# Patient Record
Sex: Male | Born: 1960
Health system: Southern US, Community
[De-identification: ages and names within clinical notes are randomized; demographics above are authoritative.]

## PROBLEM LIST (undated history)

## (undated) DIAGNOSIS — M109 Gout, unspecified: Secondary | ICD-10-CM

## (undated) DIAGNOSIS — N184 Chronic kidney disease, stage 4 (severe): Secondary | ICD-10-CM

## (undated) DIAGNOSIS — I671 Cerebral aneurysm, nonruptured: Secondary | ICD-10-CM

## (undated) DIAGNOSIS — M1990 Unspecified osteoarthritis, unspecified site: Secondary | ICD-10-CM

## (undated) DIAGNOSIS — Z972 Presence of dental prosthetic device (complete) (partial): Secondary | ICD-10-CM

## (undated) DIAGNOSIS — I10 Essential (primary) hypertension: Secondary | ICD-10-CM

## (undated) DIAGNOSIS — N183 Chronic kidney disease, stage 3 (moderate): Secondary | ICD-10-CM

## (undated) DIAGNOSIS — R569 Unspecified convulsions: Secondary | ICD-10-CM

## (undated) DIAGNOSIS — E7800 Pure hypercholesterolemia, unspecified: Secondary | ICD-10-CM

## (undated) HISTORY — PX: MULTIPLE TOOTH EXTRACTIONS: SHX2053

## (undated) HISTORY — PX: COLONOSCOPY: SHX174

## (undated) HISTORY — PX: VARICOSE VEIN SURGERY: SHX832

---

## 2001-10-21 HISTORY — PX: CEREBRAL ANEURYSM REPAIR: SHX164

## 2017-08-07 ENCOUNTER — Emergency Department (HOSPITAL_BASED_OUTPATIENT_CLINIC_OR_DEPARTMENT_OTHER): Payer: Medicaid Other

## 2017-08-07 ENCOUNTER — Inpatient Hospital Stay (HOSPITAL_BASED_OUTPATIENT_CLINIC_OR_DEPARTMENT_OTHER)
Admission: EM | Admit: 2017-08-07 | Discharge: 2017-08-11 | DRG: 513 | Disposition: A | Discharge status: Home / Self Care | Payer: Medicaid Other | Attending: Nephrology | Admitting: Nephrology

## 2017-08-07 ENCOUNTER — Encounter (HOSPITAL_BASED_OUTPATIENT_CLINIC_OR_DEPARTMENT_OTHER): Payer: Self-pay | Admitting: Emergency Medicine

## 2017-08-07 DIAGNOSIS — G40909 Epilepsy, unspecified, not intractable, without status epilepticus: Secondary | ICD-10-CM | POA: Diagnosis present

## 2017-08-07 DIAGNOSIS — F17200 Nicotine dependence, unspecified, uncomplicated: Secondary | ICD-10-CM | POA: Diagnosis present

## 2017-08-07 DIAGNOSIS — Z888 Allergy status to other drugs, medicaments and biological substances status: Secondary | ICD-10-CM

## 2017-08-07 DIAGNOSIS — Z8679 Personal history of other diseases of the circulatory system: Secondary | ICD-10-CM

## 2017-08-07 DIAGNOSIS — E785 Hyperlipidemia, unspecified: Secondary | ICD-10-CM | POA: Diagnosis present

## 2017-08-07 DIAGNOSIS — B9562 Methicillin resistant Staphylococcus aureus infection as the cause of diseases classified elsewhere: Secondary | ICD-10-CM | POA: Diagnosis present

## 2017-08-07 DIAGNOSIS — N281 Cyst of kidney, acquired: Secondary | ICD-10-CM

## 2017-08-07 DIAGNOSIS — N179 Acute kidney failure, unspecified: Secondary | ICD-10-CM

## 2017-08-07 DIAGNOSIS — T8130XA Disruption of wound, unspecified, initial encounter: Secondary | ICD-10-CM | POA: Diagnosis present

## 2017-08-07 DIAGNOSIS — I129 Hypertensive chronic kidney disease with stage 1 through stage 4 chronic kidney disease, or unspecified chronic kidney disease: Secondary | ICD-10-CM | POA: Diagnosis present

## 2017-08-07 DIAGNOSIS — M109 Gout, unspecified: Secondary | ICD-10-CM | POA: Diagnosis present

## 2017-08-07 DIAGNOSIS — Q613 Polycystic kidney, unspecified: Secondary | ICD-10-CM

## 2017-08-07 DIAGNOSIS — I10 Essential (primary) hypertension: Secondary | ICD-10-CM | POA: Diagnosis present

## 2017-08-07 DIAGNOSIS — E7800 Pure hypercholesterolemia, unspecified: Secondary | ICD-10-CM | POA: Diagnosis present

## 2017-08-07 DIAGNOSIS — M86141 Other acute osteomyelitis, right hand: Secondary | ICD-10-CM

## 2017-08-07 DIAGNOSIS — L02511 Cutaneous abscess of right hand: Secondary | ICD-10-CM | POA: Diagnosis present

## 2017-08-07 DIAGNOSIS — N183 Chronic kidney disease, stage 3 unspecified: Secondary | ICD-10-CM | POA: Diagnosis present

## 2017-08-07 DIAGNOSIS — R234 Changes in skin texture: Secondary | ICD-10-CM

## 2017-08-07 DIAGNOSIS — N170 Acute kidney failure with tubular necrosis: Secondary | ICD-10-CM | POA: Diagnosis present

## 2017-08-07 DIAGNOSIS — M869 Osteomyelitis, unspecified: Principal | ICD-10-CM | POA: Diagnosis present

## 2017-08-07 DIAGNOSIS — Z23 Encounter for immunization: Secondary | ICD-10-CM

## 2017-08-07 HISTORY — DX: Essential (primary) hypertension: I10

## 2017-08-07 HISTORY — DX: Chronic kidney disease, stage 3 (moderate): N18.3

## 2017-08-07 HISTORY — DX: Unspecified osteoarthritis, unspecified site: M19.90

## 2017-08-07 HISTORY — DX: Cerebral aneurysm, nonruptured: I67.1

## 2017-08-07 HISTORY — DX: Pure hypercholesterolemia, unspecified: E78.00

## 2017-08-07 HISTORY — DX: Unspecified convulsions: R56.9

## 2017-08-07 HISTORY — DX: Gout, unspecified: M10.9

## 2017-08-07 NOTE — ED Triage Notes (Signed)
Patient reports that he has been traveling for the last 3 weeks ago, he had his right thumb evaluated then - he currently has a swelling to his right thumb with puss draining out of it.

## 2017-08-08 ENCOUNTER — Encounter (HOSPITAL_COMMUNITY)
Admission: EM | Disposition: A | Discharge status: Home / Self Care | Payer: Self-pay | Source: Home / Self Care | Attending: Internal Medicine

## 2017-08-08 ENCOUNTER — Inpatient Hospital Stay (HOSPITAL_COMMUNITY): Payer: Medicaid Other | Admitting: Certified Registered"

## 2017-08-08 ENCOUNTER — Inpatient Hospital Stay: Admit: 2017-08-08 | Payer: Self-pay | Admitting: Orthopedic Surgery

## 2017-08-08 ENCOUNTER — Ambulatory Visit: Admit: 2017-08-08 | Payer: Self-pay | Admitting: Orthopedic Surgery

## 2017-08-08 ENCOUNTER — Encounter (HOSPITAL_BASED_OUTPATIENT_CLINIC_OR_DEPARTMENT_OTHER): Payer: Self-pay | Admitting: Emergency Medicine

## 2017-08-08 DIAGNOSIS — R234 Changes in skin texture: Secondary | ICD-10-CM

## 2017-08-08 DIAGNOSIS — N179 Acute kidney failure, unspecified: Secondary | ICD-10-CM | POA: Diagnosis not present

## 2017-08-08 DIAGNOSIS — G40909 Epilepsy, unspecified, not intractable, without status epilepticus: Secondary | ICD-10-CM | POA: Diagnosis present

## 2017-08-08 DIAGNOSIS — T8130XA Disruption of wound, unspecified, initial encounter: Secondary | ICD-10-CM | POA: Diagnosis present

## 2017-08-08 DIAGNOSIS — Z8679 Personal history of other diseases of the circulatory system: Secondary | ICD-10-CM | POA: Diagnosis not present

## 2017-08-08 DIAGNOSIS — N281 Cyst of kidney, acquired: Secondary | ICD-10-CM | POA: Diagnosis not present

## 2017-08-08 DIAGNOSIS — N183 Chronic kidney disease, stage 3 (moderate): Secondary | ICD-10-CM | POA: Diagnosis present

## 2017-08-08 DIAGNOSIS — M869 Osteomyelitis, unspecified: Secondary | ICD-10-CM | POA: Diagnosis present

## 2017-08-08 DIAGNOSIS — Z23 Encounter for immunization: Secondary | ICD-10-CM | POA: Diagnosis not present

## 2017-08-08 DIAGNOSIS — F17200 Nicotine dependence, unspecified, uncomplicated: Secondary | ICD-10-CM | POA: Diagnosis present

## 2017-08-08 DIAGNOSIS — L02511 Cutaneous abscess of right hand: Secondary | ICD-10-CM | POA: Diagnosis present

## 2017-08-08 DIAGNOSIS — M79641 Pain in right hand: Secondary | ICD-10-CM | POA: Diagnosis present

## 2017-08-08 DIAGNOSIS — M86042 Acute hematogenous osteomyelitis, left hand: Secondary | ICD-10-CM | POA: Diagnosis not present

## 2017-08-08 DIAGNOSIS — Q613 Polycystic kidney, unspecified: Secondary | ICD-10-CM | POA: Diagnosis not present

## 2017-08-08 DIAGNOSIS — B9562 Methicillin resistant Staphylococcus aureus infection as the cause of diseases classified elsewhere: Secondary | ICD-10-CM | POA: Diagnosis present

## 2017-08-08 DIAGNOSIS — E7800 Pure hypercholesterolemia, unspecified: Secondary | ICD-10-CM | POA: Diagnosis present

## 2017-08-08 DIAGNOSIS — N170 Acute kidney failure with tubular necrosis: Secondary | ICD-10-CM | POA: Diagnosis present

## 2017-08-08 DIAGNOSIS — I10 Essential (primary) hypertension: Secondary | ICD-10-CM | POA: Diagnosis not present

## 2017-08-08 DIAGNOSIS — M86141 Other acute osteomyelitis, right hand: Secondary | ICD-10-CM | POA: Diagnosis not present

## 2017-08-08 DIAGNOSIS — Z888 Allergy status to other drugs, medicaments and biological substances status: Secondary | ICD-10-CM | POA: Diagnosis not present

## 2017-08-08 DIAGNOSIS — M109 Gout, unspecified: Secondary | ICD-10-CM | POA: Diagnosis present

## 2017-08-08 DIAGNOSIS — I129 Hypertensive chronic kidney disease with stage 1 through stage 4 chronic kidney disease, or unspecified chronic kidney disease: Secondary | ICD-10-CM | POA: Diagnosis present

## 2017-08-08 DIAGNOSIS — E785 Hyperlipidemia, unspecified: Secondary | ICD-10-CM | POA: Diagnosis present

## 2017-08-08 HISTORY — PX: AMPUTATION FINGER: SHX6594

## 2017-08-08 HISTORY — PX: AMPUTATION: SHX166

## 2017-08-08 LAB — CBC WITH DIFFERENTIAL/PLATELET
Basophils Absolute: 0.1 10*3/uL (ref 0.0–0.1)
Basophils Relative: 1 %
EOS PCT: 3 %
Eosinophils Absolute: 0.3 10*3/uL (ref 0.0–0.7)
HEMATOCRIT: 36.7 % — AB (ref 39.0–52.0)
Hemoglobin: 11.9 g/dL — ABNORMAL LOW (ref 13.0–17.0)
LYMPHS PCT: 13 %
Lymphs Abs: 1.4 10*3/uL (ref 0.7–4.0)
MCH: 29.9 pg (ref 26.0–34.0)
MCHC: 32.4 g/dL (ref 30.0–36.0)
MCV: 92.2 fL (ref 78.0–100.0)
MONO ABS: 1.1 10*3/uL — AB (ref 0.1–1.0)
MONOS PCT: 10 %
Neutro Abs: 8.5 10*3/uL — ABNORMAL HIGH (ref 1.7–7.7)
Neutrophils Relative %: 75 %
PLATELETS: 243 10*3/uL (ref 150–400)
RBC: 3.98 MIL/uL — ABNORMAL LOW (ref 4.22–5.81)
RDW: 14.1 % (ref 11.5–15.5)
WBC: 11.4 10*3/uL — AB (ref 4.0–10.5)

## 2017-08-08 LAB — BASIC METABOLIC PANEL
Anion gap: 5 (ref 5–15)
BUN: 39 mg/dL — AB (ref 6–20)
CALCIUM: 8.8 mg/dL — AB (ref 8.9–10.3)
CO2: 22 mmol/L (ref 22–32)
CREATININE: 1.94 mg/dL — AB (ref 0.61–1.24)
Chloride: 112 mmol/L — ABNORMAL HIGH (ref 101–111)
GFR calc Af Amer: 44 mL/min — ABNORMAL LOW (ref >60)
GFR calc non Af Amer: 38 mL/min — ABNORMAL LOW (ref >60)
GLUCOSE: 102 mg/dL — AB (ref 65–99)
Potassium: 4 mmol/L (ref 3.5–5.1)
Sodium: 139 mmol/L (ref 135–145)

## 2017-08-08 SURGERY — AMPUTATION DIGIT
Anesthesia: General | Site: Thumb | Laterality: Right

## 2017-08-08 SURGERY — AMPUTATION DIGIT
Anesthesia: General | Laterality: Right

## 2017-08-08 MED ORDER — EPHEDRINE 5 MG/ML INJ
INTRAVENOUS | Status: AC
  Filled 2017-08-08: qty 10

## 2017-08-08 MED ORDER — HYDROMORPHONE HCL 1 MG/ML IJ SOLN: 0.5000 mg | INTRAMUSCULAR | Status: DC | PRN

## 2017-08-08 MED ORDER — PHENYLEPHRINE HCL 10 MG/ML IJ SOLN
INTRAMUSCULAR | Status: DC | PRN
  Administered 2017-08-08: 80 ug via INTRAVENOUS

## 2017-08-08 MED ORDER — 0.9 % SODIUM CHLORIDE (POUR BTL) OPTIME
TOPICAL | Status: DC | PRN
  Administered 2017-08-08: 1000 mL

## 2017-08-08 MED ORDER — TETANUS-DIPHTH-ACELL PERTUSSIS 5-2.5-18.5 LF-MCG/0.5 IM SUSP
0.5000 mL | Freq: Once | INTRAMUSCULAR | Status: AC
  Administered 2017-08-08: 0.5 mL via INTRAMUSCULAR
  Filled 2017-08-08: qty 0.5

## 2017-08-08 MED ORDER — ATORVASTATIN CALCIUM 20 MG PO TABS
20.0000 mg | ORAL_TABLET | Freq: Every day | ORAL | Status: DC
  Administered 2017-08-08 – 2017-08-11 (×4): 20 mg via ORAL
  Filled 2017-08-08 (×4): qty 1

## 2017-08-08 MED ORDER — HYDROMORPHONE HCL 1 MG/ML IJ SOLN
INTRAMUSCULAR | Status: AC
  Filled 2017-08-08: qty 1

## 2017-08-08 MED ORDER — ONDANSETRON HCL 4 MG/2ML IJ SOLN
INTRAMUSCULAR | Status: AC
  Filled 2017-08-08: qty 2

## 2017-08-08 MED ORDER — LOSARTAN POTASSIUM-HCTZ 50-12.5 MG PO TABS: 1.0000 | ORAL_TABLET | Freq: Every day | ORAL | Status: DC

## 2017-08-08 MED ORDER — EPHEDRINE SULFATE-NACL 50-0.9 MG/10ML-% IV SOSY
PREFILLED_SYRINGE | INTRAVENOUS | Status: DC | PRN
  Administered 2017-08-08: 10 mg via INTRAVENOUS

## 2017-08-08 MED ORDER — SODIUM CHLORIDE 0.9 % IV SOLN
INTRAVENOUS | Status: AC
  Administered 2017-08-08: 16:00:00 via INTRAVENOUS

## 2017-08-08 MED ORDER — LIDOCAINE 2% (20 MG/ML) 5 ML SYRINGE
INTRAMUSCULAR | Status: AC
  Filled 2017-08-08: qty 5

## 2017-08-08 MED ORDER — DEXAMETHASONE SODIUM PHOSPHATE 10 MG/ML IJ SOLN
INTRAMUSCULAR | Status: AC
  Filled 2017-08-08: qty 1

## 2017-08-08 MED ORDER — ONDANSETRON HCL 4 MG/2ML IJ SOLN
INTRAMUSCULAR | Status: DC | PRN
  Administered 2017-08-08: 4 mg via INTRAVENOUS

## 2017-08-08 MED ORDER — ROCURONIUM BROMIDE 10 MG/ML (PF) SYRINGE
PREFILLED_SYRINGE | INTRAVENOUS | Status: AC
  Filled 2017-08-08: qty 5

## 2017-08-08 MED ORDER — PIPERACILLIN-TAZOBACTAM 3.375 G IVPB 30 MIN
3.3750 g | Freq: Once | INTRAVENOUS | Status: AC
  Administered 2017-08-08: 3.375 g via INTRAVENOUS
  Filled 2017-08-08: qty 50

## 2017-08-08 MED ORDER — MIDAZOLAM HCL 2 MG/2ML IJ SOLN
INTRAMUSCULAR | Status: AC
  Filled 2017-08-08: qty 2

## 2017-08-08 MED ORDER — FENTANYL CITRATE (PF) 100 MCG/2ML IJ SOLN
INTRAMUSCULAR | Status: DC | PRN
Start: 2017-08-08 — End: 2017-08-08
  Administered 2017-08-08: 75 ug via INTRAVENOUS
  Administered 2017-08-08: 50 ug via INTRAVENOUS
  Administered 2017-08-08: 75 ug via INTRAVENOUS
  Administered 2017-08-08: 50 ug via INTRAVENOUS

## 2017-08-08 MED ORDER — ATENOLOL 100 MG PO TABS
100.0000 mg | ORAL_TABLET | Freq: Every day | ORAL | Status: DC
  Administered 2017-08-08 – 2017-08-11 (×4): 100 mg via ORAL
  Filled 2017-08-08 (×3): qty 1
  Filled 2017-08-08 (×4): qty 2
  Filled 2017-08-08: qty 1

## 2017-08-08 MED ORDER — PIPERACILLIN-TAZOBACTAM 3.375 G IVPB 30 MIN
3.3750 g | Freq: Once | INTRAVENOUS | Status: AC
  Administered 2017-08-08: 3.375 g via INTRAVENOUS
  Filled 2017-08-08 (×2): qty 50

## 2017-08-08 MED ORDER — SODIUM CHLORIDE 0.9 % IV SOLN
INTRAVENOUS | Status: DC
  Administered 2017-08-08: 10:00:00 via INTRAVENOUS

## 2017-08-08 MED ORDER — FENTANYL CITRATE (PF) 250 MCG/5ML IJ SOLN
INTRAMUSCULAR | Status: AC
  Filled 2017-08-08: qty 5

## 2017-08-08 MED ORDER — FEBUXOSTAT 40 MG PO TABS
40.0000 mg | ORAL_TABLET | Freq: Every day | ORAL | Status: DC
  Administered 2017-08-08 – 2017-08-11 (×4): 40 mg via ORAL
  Filled 2017-08-08 (×4): qty 1

## 2017-08-08 MED ORDER — OXYCODONE HCL 5 MG/5ML PO SOLN
5.0000 mg | Freq: Once | ORAL | Status: DC | PRN
Start: 2017-08-08 — End: 2017-08-08

## 2017-08-08 MED ORDER — ADULT MULTIVITAMIN W/MINERALS CH
1.0000 | ORAL_TABLET | Freq: Every day | ORAL | Status: DC
  Administered 2017-08-08 – 2017-08-11 (×4): 1 via ORAL
  Filled 2017-08-08 (×4): qty 1

## 2017-08-08 MED ORDER — ONDANSETRON HCL 4 MG/2ML IJ SOLN: 4.0000 mg | Freq: Four times a day (QID) | INTRAMUSCULAR | Status: DC | PRN

## 2017-08-08 MED ORDER — LIDOCAINE-EPINEPHRINE 2 %-1:100000 IJ SOLN
30.0000 mL | Freq: Once | INTRAMUSCULAR | Status: AC
Start: 2017-08-08 — End: 2017-08-08
  Administered 2017-08-08: 30 mL
  Filled 2017-08-08: qty 2

## 2017-08-08 MED ORDER — PHENYLEPHRINE 40 MCG/ML (10ML) SYRINGE FOR IV PUSH (FOR BLOOD PRESSURE SUPPORT)
PREFILLED_SYRINGE | INTRAVENOUS | Status: AC
  Filled 2017-08-08: qty 10

## 2017-08-08 MED ORDER — HYDROCODONE-ACETAMINOPHEN 5-325 MG PO TABS
1.0000 | ORAL_TABLET | ORAL | Status: DC | PRN
  Administered 2017-08-08 – 2017-08-11 (×7): 2 via ORAL
  Filled 2017-08-08 (×7): qty 2

## 2017-08-08 MED ORDER — LIDOCAINE HCL (CARDIAC) 20 MG/ML IV SOLN
INTRAVENOUS | Status: DC | PRN
  Administered 2017-08-08: 100 mg via INTRAVENOUS

## 2017-08-08 MED ORDER — MIDAZOLAM HCL 5 MG/5ML IJ SOLN
INTRAMUSCULAR | Status: DC | PRN
  Administered 2017-08-08: 2 mg via INTRAVENOUS

## 2017-08-08 MED ORDER — ENOXAPARIN SODIUM 40 MG/0.4ML ~~LOC~~ SOLN
40.0000 mg | SUBCUTANEOUS | Status: DC
  Administered 2017-08-09 – 2017-08-11 (×3): 40 mg via SUBCUTANEOUS
  Filled 2017-08-08 (×3): qty 0.4

## 2017-08-08 MED ORDER — HYDROMORPHONE HCL 1 MG/ML IJ SOLN
0.2500 mg | INTRAMUSCULAR | Status: DC | PRN
Start: 2017-08-08 — End: 2017-08-08
  Administered 2017-08-08 (×2): 0.5 mg via INTRAVENOUS

## 2017-08-08 MED ORDER — PIPERACILLIN-TAZOBACTAM 3.375 G IVPB
3.3750 g | Freq: Three times a day (TID) | INTRAVENOUS | Status: DC
  Administered 2017-08-08 – 2017-08-09 (×3): 3.375 g via INTRAVENOUS
  Filled 2017-08-08 (×4): qty 50

## 2017-08-08 MED ORDER — ONDANSETRON HCL 4 MG PO TABS: 4.0000 mg | ORAL_TABLET | Freq: Four times a day (QID) | ORAL | Status: DC | PRN

## 2017-08-08 MED ORDER — FENTANYL CITRATE (PF) 100 MCG/2ML IJ SOLN
50.0000 ug | Freq: Once | INTRAMUSCULAR | Status: AC
  Administered 2017-08-08: 50 ug via INTRAVENOUS
  Filled 2017-08-08: qty 2

## 2017-08-08 MED ORDER — PNEUMOCOCCAL VAC POLYVALENT 25 MCG/0.5ML IJ INJ
0.5000 mL | INJECTION | INTRAMUSCULAR | Status: AC
  Administered 2017-08-09: 0.5 mL via INTRAMUSCULAR
  Filled 2017-08-08: qty 0.5

## 2017-08-08 MED ORDER — ZONISAMIDE 100 MG PO CAPS
100.0000 mg | ORAL_CAPSULE | Freq: Two times a day (BID) | ORAL | Status: DC
Start: 2017-08-08 — End: 2017-08-11
  Administered 2017-08-08 – 2017-08-11 (×6): 100 mg via ORAL
  Filled 2017-08-08 (×9): qty 1

## 2017-08-08 MED ORDER — VANCOMYCIN HCL IN DEXTROSE 1-5 GM/200ML-% IV SOLN
1000.0000 mg | Freq: Once | INTRAVENOUS | Status: AC
  Administered 2017-08-08: 1000 mg via INTRAVENOUS
  Filled 2017-08-08: qty 200

## 2017-08-08 MED ORDER — SUCCINYLCHOLINE CHLORIDE 200 MG/10ML IV SOSY
PREFILLED_SYRINGE | INTRAVENOUS | Status: AC
  Filled 2017-08-08: qty 10

## 2017-08-08 MED ORDER — SODIUM CHLORIDE 0.9 % IV SOLN
INTRAVENOUS | Status: DC
  Administered 2017-08-08: 06:00:00 via INTRAVENOUS

## 2017-08-08 MED ORDER — FAMOTIDINE 20 MG PO TABS
20.0000 mg | ORAL_TABLET | Freq: Every day | ORAL | Status: DC
  Administered 2017-08-08 – 2017-08-11 (×4): 20 mg via ORAL
  Filled 2017-08-08 (×4): qty 1

## 2017-08-08 MED ORDER — SUGAMMADEX SODIUM 200 MG/2ML IV SOLN
INTRAVENOUS | Status: AC
  Filled 2017-08-08: qty 2

## 2017-08-08 MED ORDER — OXYCODONE HCL 5 MG PO TABS: 5.0000 mg | ORAL_TABLET | Freq: Once | ORAL | Status: DC | PRN

## 2017-08-08 MED ORDER — VANCOMYCIN HCL IN DEXTROSE 750-5 MG/150ML-% IV SOLN
750.0000 mg | Freq: Two times a day (BID) | INTRAVENOUS | Status: DC
  Administered 2017-08-08 – 2017-08-11 (×6): 750 mg via INTRAVENOUS
  Filled 2017-08-08 (×7): qty 150

## 2017-08-08 MED ORDER — DOCUSATE SODIUM 100 MG PO CAPS
100.0000 mg | ORAL_CAPSULE | Freq: Two times a day (BID) | ORAL | Status: DC
  Administered 2017-08-08 – 2017-08-10 (×5): 100 mg via ORAL
  Filled 2017-08-08 (×7): qty 1

## 2017-08-08 MED ORDER — PROPOFOL 10 MG/ML IV BOLUS
INTRAVENOUS | Status: AC
  Filled 2017-08-08: qty 20

## 2017-08-08 MED ORDER — SODIUM CHLORIDE 0.9 % IV BOLUS (SEPSIS)
1000.0000 mL | Freq: Once | INTRAVENOUS | Status: AC
  Administered 2017-08-08: 1000 mL via INTRAVENOUS

## 2017-08-08 MED ORDER — PROPOFOL 10 MG/ML IV BOLUS
INTRAVENOUS | Status: DC | PRN
Start: 2017-08-08 — End: 2017-08-08
  Administered 2017-08-08: 200 mg via INTRAVENOUS

## 2017-08-08 MED ORDER — BUPIVACAINE-EPINEPHRINE (PF) 0.5% -1:200000 IJ SOLN
INTRAMUSCULAR | Status: AC
  Filled 2017-08-08: qty 30

## 2017-08-08 MED ORDER — LIDOCAINE HCL (PF) 1 % IJ SOLN
INTRAMUSCULAR | Status: AC
  Filled 2017-08-08: qty 30

## 2017-08-08 MED ORDER — MAGNESIUM HYDROXIDE 400 MG/5ML PO SUSP: 30.0000 mL | Freq: Every day | ORAL | Status: DC | PRN

## 2017-08-08 SURGICAL SUPPLY — 36 items
BANDAGE COBAN STERILE 2 (GAUZE/BANDAGES/DRESSINGS) IMPLANT
BLADE AVERAGE 25MMX9MM (BLADE)
BLADE AVERAGE 25X9 (BLADE) IMPLANT
BNDG COHESIVE 1X5 TAN STRL LF (GAUZE/BANDAGES/DRESSINGS) ×3 IMPLANT
BNDG COHESIVE 4X5 TAN NS LF (GAUZE/BANDAGES/DRESSINGS) IMPLANT
BNDG CONFORM 2 STRL LF (GAUZE/BANDAGES/DRESSINGS) ×3 IMPLANT
BNDG CONFORM 3 STRL LF (GAUZE/BANDAGES/DRESSINGS) ×3 IMPLANT
BNDG ELASTIC 2X5.8 VLCR STR LF (GAUZE/BANDAGES/DRESSINGS) IMPLANT
BNDG ESMARK 4X9 LF (GAUZE/BANDAGES/DRESSINGS) IMPLANT
BNDG GAUZE ELAST 4 BULKY (GAUZE/BANDAGES/DRESSINGS) ×6 IMPLANT
CHLORAPREP W/TINT 26ML (MISCELLANEOUS) ×3 IMPLANT
CORDS BIPOLAR (ELECTRODE) ×3 IMPLANT
CUFF TOURNIQUET SINGLE 18IN (TOURNIQUET CUFF) ×3 IMPLANT
DRAPE SURG 17X23 STRL (DRAPES) ×3 IMPLANT
DRSG EMULSION OIL 3X3 NADH (GAUZE/BANDAGES/DRESSINGS) ×3 IMPLANT
ELECT REM PT RETURN 9FT ADLT (ELECTROSURGICAL) ×3
ELECTRODE REM PT RTRN 9FT ADLT (ELECTROSURGICAL) ×1 IMPLANT
GAUZE SPONGE 4X4 12PLY STRL (GAUZE/BANDAGES/DRESSINGS) ×3 IMPLANT
GAUZE XEROFORM 1X8 LF (GAUZE/BANDAGES/DRESSINGS) ×3 IMPLANT
GLOVE BIO SURGEON STRL SZ7.5 (GLOVE) ×3 IMPLANT
GLOVE BIOGEL PI IND STRL 8 (GLOVE) ×1 IMPLANT
GLOVE BIOGEL PI INDICATOR 8 (GLOVE) ×2
GOWN STRL REUS W/ TWL XL LVL3 (GOWN DISPOSABLE) ×1 IMPLANT
GOWN STRL REUS W/TWL XL LVL3 (GOWN DISPOSABLE) ×2
KIT BASIN OR (CUSTOM PROCEDURE TRAY) ×3 IMPLANT
NEEDLE HYPO 25X1 1.5 SAFETY (NEEDLE) IMPLANT
PACK ORTHO EXTREMITY (CUSTOM PROCEDURE TRAY) ×3 IMPLANT
PAD CAST 4YDX4 CTTN HI CHSV (CAST SUPPLIES) IMPLANT
PADDING CAST ABS 4INX4YD NS (CAST SUPPLIES) ×2
PADDING CAST ABS COTTON 4X4 ST (CAST SUPPLIES) ×1 IMPLANT
PADDING CAST COTTON 4X4 STRL (CAST SUPPLIES)
PENCIL BUTTON HOLSTER BLD 10FT (ELECTRODE) IMPLANT
SUT VICRYL RAPIDE 4/0 PS 2 (SUTURE) ×3 IMPLANT
SYR 10ML LL (SYRINGE) IMPLANT
TOWEL OR 17X24 6PK STRL BLUE (TOWEL DISPOSABLE) ×3 IMPLANT
UNDERPAD 30X30 (UNDERPADS AND DIAPERS) ×3 IMPLANT

## 2017-08-08 NOTE — Progress Notes (Signed)
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  I would assume he would be administered broad-spectrum antibiotics intravenously until culture results help to tailor an appropriate outpatient antibiotic regimen.  Given the severity of the infection, and perhaps his limited resources for outpatient antibiotics, consultation with infectious diseases regarding appropriate antibiotic management in this setting might be warranted.   Dressing is to remain in place, with surgical follow-up regarding wound healing, etc. in 7-10 days.  Micheline Rough, MD Hand Surgery  Mobile [phone]

## 2017-08-08 NOTE — Plan of Care (Addendum)
56 yo M with infected R thumb with osteomyelitis that they want to transfer to [place].  EDP spoke with hand surgery, apparently hand surgeon said that from what he heard he wasn't sure if patient needed to be admitted tonight, apparently EDP trying to get requested pictures to him.  Got zosyn / vanc in ED.  No beds currently available at [facility] but will let [facility] know they can put in for med-surg if EDP / hand surgeon wants [place] to admit.  Creat 1.9 with BUN 39, unclear if acute or chronic.  WBC 11.4k.

## 2017-08-08 NOTE — ED Provider Notes (Signed)
  Physical Exam  BP (!) 137/95 (BP Location: Left Arm)   Pulse 75   Temp 98.1 F (36.7 C) (Oral)   Resp 16   Ht 5\' 6"  (1.676 m)   Wt 72.6 kg (160 lb)   SpO2 100%   BMI 25.82 kg/m   Physical Exam  ED Course  Procedures  MDM Care assumed at sign out. Patient is from [facility] and had a break in 3 weeks ago and then had R thumb osteo. Went to [facility] and came down here to visit mother. For 2 days, there is purulent discharge from R thumb. Patient states that he is on abx but doesn't remember which one. He had xrays that showed osteo R thumb. Dr. Grandville Silos from hand surgery consulted and will see patient at [facility]. Dr. Alcario Drought accepting overnight but Dr. Randal Buba was unable to talk to him on the phone. I talked to Dr. Allyson Sabal, hospitalist at [facility], about the patient. Patient still pending med/surg bed at [facility] and Dr. Grandville Silos will also see patient. Stable for transfer.     Drenda Freeze, MD 08/08/17 [phone]

## 2017-08-08 NOTE — Progress Notes (Signed)
Pharmacy Antibiotic Note  Logan Decker is a 56 y.o. male admitted on 08/07/2017 with osteomyelitis/wound infection of right thumb.  Pharmacy has been consulted for vancomycin and zosyn dosing. Patient is not s/p amputation of right thumb. WBC 11.4. SCr 1.94. nCrCl ~ 45 mL/min. Patient has already received a dose of vancomycin 1 gm IV at 0143 on 10/19 and two doses of Zosyn.   Plan: -Zosyn 3.375 gm IV Q 8 hours (EI infusion) -Vancomycin 750 mg IV Q 12 hours  -Monitor CBC, renal fx, cultures and clinical progress -VT at [facility]   Height: 5' 5.75" (167 cm) Weight: 160 lb (72.6 kg) IBW/kg (Calculated) : 63.22  Temp (24hrs), Avg:97.8 F (36.6 C), Min:97.3 F (36.3 C), Max:98.3 F (36.8 C)   Recent Labs Lab 08/08/17 0126  WBC 11.4*  CREATININE 1.94*    Estimated Creatinine Clearance: 39.4 mL/min (A) (by C-G formula based on SCr of 1.94 mg/dL (H)).    Allergies  Allergen Reactions  . Ace Inhibitors     Unsure of which one     Antimicrobials this admission: 10/19 Vanc >>  10/19 Zosyn >>   Dose adjustments this admission: None   Microbiology results: 10/19 BCx:  10/19 Abscess: moderate GPC in clusters    Thank you for allowing pharmacy to be a part of this patient's care.  Albertina Parr, PharmD., BCPS Clinical Pharmacist Pager [phone]

## 2017-08-08 NOTE — Transfer of Care (Signed)
Immediate Anesthesia Transfer of Care Note  Patient: Logan Decker  Procedure(s) Performed: AMPUTATION right tumb (Right Thumb)  Patient Location: PACU  Anesthesia Type:General  Level of Consciousness: awake, alert  and patient cooperative  Airway & Oxygen Therapy: Patient Spontanous Breathing and Patient connected to nasal cannula oxygen  Post-op Assessment: Report given to RN and Post -op Vital signs reviewed and stable  Post vital signs: Reviewed and stable  Last Vitals:  Vitals:   08/08/17 0734 08/08/17 0912  BP: (!) 161/88 (!) 140/96  Pulse: 63 60  Resp: 18 18  Temp:    SpO2: 100% 100%    Last Pain:  Vitals:   08/08/17 0733  TempSrc:   PainSc: 3          Complications: No apparent anesthesia complications

## 2017-08-08 NOTE — ED Notes (Signed)
Assisted EDP with Expression of wound. Right thumb was soaking in saline and  Betadine. Was oozing yellow discharge, when EDP pressed there was large amount of yellow discharge and blood.

## 2017-08-08 NOTE — Progress Notes (Signed)
Patient with established R thumb infection, appears to be chronic felon, now + osteo of at least distal phalanx with large amounts of necrotic volar pulp skin.  Will require amputation, hopefully of just distal phalanx, but soft tissue closure will be challenging and may require delayed Moberg flap for coverage.  Presently on Friday OR schedule at [facility] for 11am.  Please keep NPO, No Heparin.  Micheline Rough, MD Hand Surgery Mobile [phone]

## 2017-08-08 NOTE — Anesthesia Preprocedure Evaluation (Signed)
Anesthesia Evaluation  Patient identified by MRN, date of birth, ID band Patient awake    Reviewed: NPO status   Airway Mallampati: II   Neck ROM: Full    Dental  (+) Edentulous Lower, Edentulous Upper   Pulmonary Current Smoker,    breath sounds clear to auscultation       Cardiovascular hypertension,  Rhythm:Regular Rate:Normal     Neuro/Psych Seizures -,  Hx of brain aneurysm    GI/Hepatic   Endo/Other    Renal/GU      Musculoskeletal   Abdominal   Peds  Hematology negative hematology ROS (+)   Anesthesia Other Findings   Reproductive/Obstetrics                             Anesthesia Physical Anesthesia Plan  ASA: III  Anesthesia Plan: General   Post-op Pain Management:    Induction: Intravenous  PONV Risk Score and Plan: 2 and Ondansetron and Dexamethasone  Airway Management Planned: LMA  Additional Equipment:   Intra-op Plan:   Post-operative Plan: Extubation in OR  Informed Consent: I have reviewed the patients History and Physical, chart, labs and discussed the procedure including the risks, benefits and alternatives for the proposed anesthesia with the patient or authorized representative who has indicated his/her understanding and acceptance.   Dental advisory given  Plan Discussed with: CRNA  Anesthesia Plan Comments:         Anesthesia Quick Evaluation

## 2017-08-08 NOTE — Progress Notes (Signed)
56 y.o. male with a hx of brain aneurysm ,gout ,HTN who presented to the ED at [facility] approximately 6 hours ago with a history of right thumb infection, DR THOMPSON HAS  Already been notified by [facility] Please send him notification when patient arrives  Accepted to med surg and started on broad spectrum abx

## 2017-08-08 NOTE — ED Provider Notes (Signed)
[facility] EMERGENCY DEPARTMENT Provider Note   CSN: [id number] Arrival date & time: 08/07/17  1944     History   Chief Complaint Chief Complaint  Patient presents with  . Wound Check    HPI Logan Decker is a 56 y.o. male.   Abscess  Location:  Hand Hand abscess location:  R fingers (thumb) Abscess quality: draining, fluctuance and painful   Red streaking: no   Duration:  4 weeks Progression:  Worsening Pain details:    Quality:  Dull   Severity:  Severe   Duration:  1 month   Timing:  Constant   Progression:  Worsening Chronicity:  New Context: not diabetes   Relieved by:  Nothing Worsened by:  Nothing Ineffective treatments:  None tried Associated symptoms: no fever and no vomiting   Risk factors: no prior abscess   Seen in [place] on 07/13/17 and had an I/D of the tuft of the right thumb.  Was given oral antibiotics unknown name or time.  He then moved to [place] and could not be seen due  To insurance and then moved here and things have continued to worsen.  2 opening that he states he continues to express purulent material from.    Past Medical History:  Diagnosis Date  . Brain aneurysm   . Gout   . Hypertension   . Renal disorder   . Seizures ([place])     There are no active problems to display for this patient.   Past Surgical History:  Procedure Laterality Date  . BRAIN SURGERY         Home Medications    Prior to Admission medications   Medication Sig Start Date End Date Taking? Authorizing Provider  febuxostat (ULORIC) 40 MG tablet Take 40 mg by mouth daily.   Yes [provider]  ranitidine (ZANTAC) 150 MG tablet Take 150 mg by mouth 2 (two) times daily.   Yes [provider]  zonisamide (ZONEGRAN) 100 MG capsule Take 100 mg by mouth 2 (two) times daily.   Yes [provider]  atenolol (TENORMIN) 100 MG tablet Take 100 mg by mouth daily.    [provider]  atorvastatin (LIPITOR) 20 MG tablet Take 20  mg by mouth daily.    [provider]  losartan-hydrochlorothiazide (HYZAAR) 50-12.5 MG tablet Take 1 tablet by mouth daily.    [provider]    Family History History reviewed. No pertinent family history.  Social History Social History  Substance Use Topics  . Smoking status: Current Some Day Smoker  . Smokeless tobacco: Never Used  . Alcohol use No     Allergies   Ace inhibitors   Review of Systems Review of Systems  Constitutional: Negative for fever.  Gastrointestinal: Negative for vomiting.  Musculoskeletal: Positive for arthralgias and joint swelling.  Skin: Positive for wound.  All other systems reviewed and are negative.    Physical Exam Updated Vital Signs BP (!) 129/95 (BP Location: Left Arm)   Pulse 67   Temp (!) 97.5 F (36.4 C) (Oral)   Resp 18   Ht 5\' 6"  (1.676 m)   Wt 72.6 kg (160 lb)   SpO2 100%   BMI 25.82 kg/m   Physical Exam  Constitutional: He is oriented to person, place, and time. He appears well-developed and well-nourished. No distress.  HENT:  Head: Normocephalic and atraumatic.  Mouth/Throat: Oropharynx is clear and moist.  Eyes: Pupils are equal, round, and reactive to light. Conjunctivae  are normal.  Neck: Normal range of motion. Neck supple.  Cardiovascular: Normal rate, regular rhythm, normal heart sounds and intact distal pulses.   Pulmonary/Chest: Effort normal and breath sounds normal.  Abdominal: Soft. Bowel sounds are normal. He exhibits no mass. There is no tenderness. There is no rebound and no guarding.  Musculoskeletal:       Right hand: He exhibits tenderness and swelling. He exhibits normal two-point discrimination, normal capillary refill and no deformity. Normal sensation noted. Normal strength noted.       Hands: Neurological: He is alert and oriented to person, place, and time. He displays normal reflexes.  Skin: Skin is warm and dry. Capillary refill takes less than 2 seconds.  Psychiatric: He  has a normal mood and affect.     ED Treatments / Results  Labs (all labs ordered are listed, but only abnormal results are displayed)  Results for orders placed or performed during the hospital encounter of 08/07/17  CBC with Differential/Platelet  Result Value Ref Range   WBC 11.4 (H) 4.0 - 10.5 K/uL   RBC 3.98 (L) 4.22 - 5.81 MIL/uL   Hemoglobin 11.9 (L) 13.0 - 17.0 g/dL   HCT 36.7 (L) 39.0 - 52.0 %   MCV 92.2 78.0 - 100.0 fL   MCH 29.9 26.0 - 34.0 pg   MCHC 32.4 30.0 - 36.0 g/dL   RDW 14.1 11.5 - 15.5 %   Platelets 243 150 - 400 K/uL   Neutrophils Relative % 75 %   Neutro Abs 8.5 (H) 1.7 - 7.7 K/uL   Lymphocytes Relative 13 %   Lymphs Abs 1.4 0.7 - 4.0 K/uL   Monocytes Relative 10 %   Monocytes Absolute 1.1 (H) 0.1 - 1.0 K/uL   Eosinophils Relative 3 %   Eosinophils Absolute 0.3 0.0 - 0.7 K/uL   Basophils Relative 1 %   Basophils Absolute 0.1 0.0 - 0.1 K/uL  Basic metabolic panel  Result Value Ref Range   Sodium 139 135 - 145 mmol/L   Potassium 4.0 3.5 - 5.1 mmol/L   Chloride 112 (H) 101 - 111 mmol/L   CO2 22 22 - 32 mmol/L   Glucose, Bld 102 (H) 65 - 99 mg/dL   BUN 39 (H) 6 - 20 mg/dL   Creatinine, Ser 1.94 (H) 0.61 - 1.24 mg/dL   Calcium 8.8 (L) 8.9 - 10.3 mg/dL   GFR calc non Af Amer 38 (L) >60 mL/min   GFR calc Af Amer 44 (L) >60 mL/min   Anion gap 5 5 - 15   Dg Finger Thumb Right  Result Date: 08/07/2017 CLINICAL DATA:  Right thumb pain, swelling, and pus drainage. EXAM: RIGHT THUMB 2+V COMPARISON:  None. FINDINGS: Marked soft tissue swelling is seen overlying the distal phalanx. Cortical destruction and osteomyelitis cysts is seen involving the distal phalanx, predominantly along its volar and medial aspect, consistent with osteomyelitis. No soft tissue gas or radiopaque foreign body identified. IMPRESSION: Acute osteomyelitis of distal phalanx. Electronically Signed   By: Earle Gell M.D.   On: 08/07/2017 20:55     Radiology Dg Finger Thumb  Right  Result Date: 08/07/2017 CLINICAL DATA:  Right thumb pain, swelling, and pus drainage. EXAM: RIGHT THUMB 2+V COMPARISON:  None. FINDINGS: Marked soft tissue swelling is seen overlying the distal phalanx. Cortical destruction and osteomyelitis cysts is seen involving the distal phalanx, predominantly along its volar and medial aspect, consistent with osteomyelitis. No soft tissue gas or radiopaque foreign body identified. IMPRESSION: Acute  osteomyelitis of distal phalanx. Electronically Signed   By: Earle Gell M.D.   On: 08/07/2017 20:55    Procedures Procedures (including critical care time)  Medications Ordered in ED Medications  0.9 %  sodium chloride infusion (not administered)  fentaNYL (SUBLIMAZE) injection 50 mcg (not administered)  Tdap (BOOSTRIX) injection 0.5 mL (0.5 mLs Intramuscular Given 08/08/17 0143)  vancomycin (VANCOCIN) IVPB 1000 mg/200 mL premix (0 mg Intravenous Stopped 08/08/17 0256)  piperacillin-tazobactam (ZOSYN) IVPB 3.375 g (0 g Intravenous Stopped 08/08/17 0255)  lidocaine-EPINEPHrine (XYLOCAINE W/EPI) 2 %-1:100000 (with pres) injection 30 mL (30 mLs Infiltration Given 08/08/17 0143)  sodium chloride 0.9 % bolus 1,000 mL (1,000 mLs Intravenous New Bag/Given 08/08/17 0345)   Case d/w Dr. Grandville Silos will keep NPO for surgery    Final Clinical Impressions(s) / ED Diagnoses   Osteomyelitis Renal insufficiency  Will admit to medicine with hand surgery consult.     Haydn Hutsell, MD 08/08/17 [phone]

## 2017-08-08 NOTE — H&P (Signed)
History and Physical    Jarek Longton TKW:[id number] DOB: [birth year] DOA: 08/07/2017   PCP: Patient, No Pcp Per   Patient coming from:  Home    Chief Complaint: Right thumb osteomyelitis    HPI: Logan Decker is a 56 y.o. male with medical history significant for gout, hypertension, CTV, history of seizures or history of brain aneurysm status post clipping, presenting to the hospitalafter found to have osteomyelitis destruction of the distal phalanx, with purulent drainage. The patient required right thumb amputation through the IP joints, by Dr. Grandville Silos hand surgery. He tolerated the procedure well, without complications. The patient is being admitted for follow-up, in view of his chronic medical issues. Apparently, he is from [place], [place], and had a break 3 weeks ago, at the time, diagnosed with right thumb osteomyelitis. The patient traveled to [place], then to hear to [facility] with purulent drainage in his thumb, then seeking medical attention at [facility].  He was placed on   broad-spectrum antibiotics with vancomycin and Zosyn and surgery followed.  Denies fevers, But does report chills, denies  night sweats  Denies any respiratory complaints. Denies any chest pain or palpitations. Denies lower extremity swelling. Denies nausea, heartburn or change in bowel habits  Denies abdominal pain. Appetite is normal. Denies any dysuria. Denies abnormal skin rashes, or neuropathy. Denies any bleeding issues such as epistaxis, hematemesis, hematuria or hematochezia. Ambulating without difficulty.    Review of Systems:  As per HPI otherwise all other systems reviewed and are negative  Past Medical History:  Diagnosis Date  . Brain aneurysm   . Gout   . Hypertension   . Renal disorder   . Seizures ([place])     Past Surgical History:  Procedure Laterality Date  . BRAIN SURGERY      Social History Social History   Social History  . Marital status: Divorced    Spouse name: N/A  . Number  of children: N/A  . Years of education: N/A   Occupational History  . Not on file.   Social History Main Topics  . Smoking status: Current Some Day Smoker  . Smokeless tobacco: Never Used  . Alcohol use No  . Drug use: No  . Sexual activity: Not on file   Other Topics Concern  . Not on file   Social History Narrative  . No narrative on file     Allergies  Allergen Reactions  . Ace Inhibitors     Unsure of which one     History reviewed. No pertinent family history.    Prior to Admission medications   Medication Sig Start Date End Date Taking? Authorizing Provider  febuxostat (ULORIC) 40 MG tablet Take 40 mg by mouth daily.   Yes [provider]  ranitidine (ZANTAC) 150 MG tablet Take 150 mg by mouth 2 (two) times daily.   Yes [provider]  zonisamide (ZONEGRAN) 100 MG capsule Take 100 mg by mouth 2 (two) times daily.   Yes [provider]  atenolol (TENORMIN) 100 MG tablet Take 100 mg by mouth daily.    [provider]  atorvastatin (LIPITOR) 20 MG tablet Take 20 mg by mouth daily.    [provider]  losartan-hydrochlorothiazide (HYZAAR) 50-12.5 MG tablet Take 1 tablet by mouth daily.    [provider]    Physical Exam:  Vitals:   08/08/17 1222 08/08/17 1237 08/08/17 1245 08/08/17 1253  BP: (!) 136/93 118/79 118/79 121/80  Pulse: 89 61  83  Resp: 20 16  16   Temp: (!) 97.3 F (36.3 C)     TempSrc:      SpO2: 100% 95%  100%  Weight:      Height:       Constitutional: NAD, anxious post surgery   Eyes: PERRL, lids and conjunctivae normal ENMT: Mucous membranes are moist, without exudate or lesions . Some missing teeth Neck: normal, supple, no masses, no thyromegaly Respiratory: clear to auscultation bilaterally, no wheezing, no crackles. Normal respiratory effort  Cardiovascular: Regular rate and rhythm,  murmur, rubs or gallops. No extremity edema. 2+ pedal pulses. No carotid bruits.  Abdomen: Soft,  non tender, No hepatosplenomegaly. Bowel sounds positive.  Musculoskeletal: no clubbing / cyanosis. Moves all extremities . R thumb bandaged  Skin: no jaundice, No lesions.  Neurologic: Sensation intact  Strength equal in all extremities Psychiatric:   Alert and oriented x 3. Anxious mood.     Labs on Admission: I have personally reviewed following labs and imaging studies  CBC:  Recent Labs Lab 08/08/17 0126  WBC 11.4*  NEUTROABS 8.5*  HGB 11.9*  HCT 36.7*  MCV 92.2  PLT 101    Basic Metabolic Panel:  Recent Labs Lab 08/08/17 0126  NA 139  K 4.0  CL 112*  CO2 22  GLUCOSE 102*  BUN 39*  CREATININE 1.94*  CALCIUM 8.8*    GFR: Estimated Creatinine Clearance: 39.4 mL/min (A) (by C-G formula based on SCr of 1.94 mg/dL (H)).  Liver Function Tests: No results for input(s): AST, ALT, ALKPHOS, BILITOT, PROT, ALBUMIN in the last 168 hours. No results for input(s): LIPASE, AMYLASE in the last 168 hours. No results for input(s): AMMONIA in the last 168 hours.  Coagulation Profile: No results for input(s): INR, PROTIME in the last 168 hours.  Cardiac Enzymes: No results for input(s): CKTOTAL, CKMB, CKMBINDEX, TROPONINI in the last 168 hours.  BNP (last 3 results) No results for input(s): PROBNP in the last 8760 hours.  HbA1C: No results for input(s): HGBA1C in the last 72 hours.  CBG: No results for input(s): GLUCAP in the last 168 hours.  Lipid Profile: No results for input(s): CHOL, HDL, LDLCALC, TRIG, CHOLHDL, LDLDIRECT in the last 72 hours.  Thyroid Function Tests: No results for input(s): TSH, T4TOTAL, FREET4, T3FREE, THYROIDAB in the last 72 hours.  Anemia Panel: No results for input(s): VITAMINB12, FOLATE, FERRITIN, TIBC, IRON, RETICCTPCT in the last 72 hours.  Urine analysis: No results found for: COLORURINE, APPEARANCEUR, LABSPEC, PHURINE, GLUCOSEU, HGBUR, BILIRUBINUR, KETONESUR, PROTEINUR, UROBILINOGEN, NITRITE, LEUKOCYTESUR  Sepsis  Labs: @LABRCNTIP (procalcitonin:4,lacticidven:4) )No results found for this or any previous visit (from the past 240 hour(s)).   Radiological Exams on Admission: Dg Finger Thumb Right  Result Date: 08/07/2017 CLINICAL DATA:  Right thumb pain, swelling, and pus drainage. EXAM: RIGHT THUMB 2+V COMPARISON:  None. FINDINGS: Marked soft tissue swelling is seen overlying the distal phalanx. Cortical destruction and osteomyelitis cysts is seen involving the distal phalanx, predominantly along its volar and medial aspect, consistent with osteomyelitis. No soft tissue gas or radiopaque foreign body identified. IMPRESSION: Acute osteomyelitis of distal phalanx. Electronically Signed   By: Earle Gell M.D.   On: 08/07/2017 20:55    EKG: Independently reviewed.  Assessment/Plan Principal Problem:   Osteomyelitis (HCC) Active Problems:   Acute renal failure with tubular necrosis (HCC)   Wound dehiscence   Eschar of finger   Osteomyelitis of the R distal phalanx   Status post amputation through the IPJ , tolerating the  procedure well. White count is 11.4. He is afebrile. He was placed on Vanco and  Zosyn Admit to inpatient MedSurg IV fluids Continue IV antibiotics Fentanyl, Dilaudid, oxycodone for pain control  CBC in a.m. Cultures are pending. CBC in am  Other plans as per Hand surgery   Acute Kidney Injury l  No confusion.  BL Cr unable to be obtained . Cr 1.94  Lab Results  Component Value Date   CREATININE 1.94 (H) 08/08/2017  IVF 100 cc/ h CMET in am    Hypertension BP 121/80  Pulse 83   Controlled  Continue home anti-hypertensive medications   Hyperlipidemia Continue home statins   History of seizures. No acute issues  Continue Zonegran    Gout, no acute flare Continue Uloric     DVT prophylaxis:  Lovenox   Code Status:    Full  Family Communication:  Discussed with patient Disposition Plan: Expect patient to be discharged to home after condition improves Consults  called:    DT Grandville Silos, hand surgery  Admission status:  IP Medsurg    Christoffer Currier E, PA-C [facility]   08/08/2017, 1:39 PM

## 2017-08-08 NOTE — Op Note (Addendum)
08/07/2017 - 08/08/2017  11:10 AM  PATIENT:  Logan Decker  56 y.o. male  PRE-OPERATIVE DIAGNOSIS:  Right thumb infection with osteomyelitis  POST-OPERATIVE DIAGNOSIS:  Same  PROCEDURE:  Right thumb amputation through IP joint  SURGEON: Rayvon Char. Grandville Silos, MD  PHYSICIAN ASSISTANT: Morley Kos, OPA-C  ANESTHESIA:  general  SPECIMENS:  To pathology and swabs to micro  DRAINS:   None  EBL:  less than 50 mL  PREOPERATIVE INDICATIONS:  Logan Decker is a  56 y.o. male with a chronic right thumb infection, now with osteomyelitic destruction of distal phalanx and purulent drainage.  The risks benefits and alternatives were discussed with the patient preoperatively including but not limited to the risks of infection, bleeding, nerve injury, cardiopulmonary complications, the need for revision surgery, among others, and the patient verbalized understanding and consented to proceed.  OPERATIVE IMPLANTS: none  OPERATIVE PROCEDURE:  After receiving prophylactic antibiotics, the patient was escorted to the operative theatre and placed in a supine position.  General anesthesia was administered. A surgical "time-out" was performed during which the planned procedure, proposed operative site, and the correct patient identity were compared to the operative consent and agreement confirmed by the circulating nurse according to current facility policy.  Following application of a tourniquet to the operative extremity, the exposed skin was prepped with Chloraprep and draped in the usual sterile fashion.  The limb was exsanguinated with an Esmarch bandage and the tourniquet inflated to approximately 145mmHg higher than systolic BP.  The thumb was marked with a marking pen, creating a longer radially based volar flap.  The thumb was incised circumferentially following this marked incision. Culture swabs were dipped in the purulent material and passed off. Dorsally, the soft tissues were divided just proximal  to the proximal extent of the nail matrix.  The extensor soft tissues were divided, and actually the distal phalanx was in 2 pieces.  The bulk of the specimen was passed off with the distal portion of the distal phalanx.  A small additional proximal portion was excised separately.  PIP joint was found have good cartilage and it did not appear to be itself infected.  The ulnar neurovascular bundle was divided proximal to the incision to prevent neuroma formation at the scar itself.  The FPL was divided and allowed to retract proximally.  The distal aspect of the proximal phalanx condyles were shaped and smoothed with a rongeur, removing the cartilage and the overhanging bony protuberances at the condyles present.  The wound was then copiously irrigated and the tourniquet released.  Additional hemostasis was obtained with bipolar electrocautery and the flap was inset and reapproximated with 4-0 Vicryl Rapide interrupted sutures.  A light thumb dressing was applied and he was taken to the recovery room in stable condition, breathing spontaneously.  DISPOSITION: The present plan is for him to be admitted to the hospitalist service, here I would assume he would be administered broad-spectrum antibiotics intravenously until culture results help to tailor an appropriate outpatient antibiotic regimen.  Given the severity of the infection, and perhaps his limited resources for outpatient antibiotics, consultation with infectious diseases regarding appropriate antibiotic management in this setting might be warranted.  Dressing is to remain in place, with surgical follow-up regarding wound healing, etc. In 7-10 days.

## 2017-08-08 NOTE — ED Notes (Signed)
Patient is resting comfortably. 

## 2017-08-08 NOTE — Discharge Instructions (Signed)
Discharge Instructions   Move your fingers as much as possible, making a full fist and fully opening the fist. Elevate your hand to reduce pain & swelling of the digits.  Ice over the operative site may be helpful to reduce pain & swelling.  DO NOT USE HEAT. Leave the dressing in place until you return to our office.  You may shower, but keep the bandage clean & dry.  You may drive a car when you are off of prescription pain medications and can safely control your vehicle with both hands.   Please call [phone] during normal business hours or [phone] after hours for any problems. Including the following:  - excessive redness of the incisions - drainage for more than 4 days - fever of more than 101.5 F  *Please note that pain medications will not be refilled after hours or on weekends.

## 2017-08-08 NOTE — Anesthesia Preprocedure Evaluation (Signed)
Anesthesia Evaluation  Patient identified by MRN, date of birth, ID band Patient awake    Reviewed: Allergy & Precautions, NPO status , Patient's Chart, lab work & pertinent test results  Airway        Dental   Pulmonary Current Smoker,           Cardiovascular hypertension, + Peripheral Vascular Disease       Neuro/Psych Seizures -,     GI/Hepatic negative GI ROS, Neg liver ROS,   Endo/Other    Renal/GU Renal disease     Musculoskeletal   Abdominal   Peds  Hematology   Anesthesia Other Findings   Reproductive/Obstetrics                             Anesthesia Physical Anesthesia Plan  ASA: II  Anesthesia Plan: General   Post-op Pain Management:    Induction: Intravenous  PONV Risk Score and Plan: 2 and Ondansetron and Dexamethasone  Airway Management Planned: LMA  Additional Equipment:   Intra-op Plan:   Post-operative Plan: Extubation in OR  Informed Consent: I have reviewed the patients History and Physical, chart, labs and discussed the procedure including the risks, benefits and alternatives for the proposed anesthesia with the patient or authorized representative who has indicated his/her understanding and acceptance.   Dental advisory given  Plan Discussed with: CRNA  Anesthesia Plan Comments:         Anesthesia Quick Evaluation

## 2017-08-08 NOTE — ED Notes (Signed)
Pt took home seizure medication with a sip of water.

## 2017-08-08 NOTE — Anesthesia Postprocedure Evaluation (Signed)
Anesthesia Post Note  Patient: Logan Decker  Procedure(s) Performed: AMPUTATION right tumb (Right Thumb)     Patient location during evaluation: PACU Anesthesia Type: General Level of consciousness: awake and confused Pain management: pain level controlled Vital Signs Assessment: post-procedure vital signs reviewed and stable Respiratory status: spontaneous breathing, nonlabored ventilation, respiratory function stable and patient connected to nasal cannula oxygen Cardiovascular status: blood pressure returned to baseline and stable Postop Assessment: no apparent nausea or vomiting Anesthetic complications: no    Last Vitals:  Vitals:   08/08/17 1245 08/08/17 1253  BP: 118/79 121/80  Pulse:  83  Resp:  16  Temp:    SpO2:  100%    Last Pain:  Vitals:   08/08/17 0733  TempSrc:   PainSc: 3                  Johana Hopkinson,JAMES TERRILL

## 2017-08-08 NOTE — Anesthesia Procedure Notes (Signed)
Procedure Name: LMA Insertion Date/Time: 08/08/2017 11:30 AM Performed by: Shirlyn Goltz Pre-anesthesia Checklist: Patient identified, Emergency Drugs available, Suction available and Patient being monitored Patient Re-evaluated:Patient Re-evaluated prior to induction Oxygen Delivery Method: Circle system utilized Preoxygenation: Pre-oxygenation with 100% oxygen Induction Type: IV induction Ventilation: Mask ventilation without difficulty LMA: LMA inserted LMA Size: 4.0 Number of attempts: 1 Placement Confirmation: positive ETCO2 and breath sounds checked- equal and bilateral Tube secured with: Tape Dental Injury: Teeth and Oropharynx as per pre-operative assessment

## 2017-08-08 NOTE — Consult Note (Signed)
ORTHOPAEDIC CONSULTATION HISTORY & PHYSICAL REQUESTING PHYSICIAN: Veatrice Kells, MD  Chief Complaint: pus draining from R thumb  HPI: Logan Decker is a 56 y.o. male who presented to the ED approximately 6 hours ago with a history of right thumb infection.  He was apparently living in [place] and underwent some type of procedure for drainage in mid to late September.  Since then, he spent some time in [place], but now has relocated to [facility] and presents for additional care.  He has received IV antibiotics in the emergency room and will be admitted to the medicine service for management of the medical aspects of his problem.  Past Medical History:  Diagnosis Date  . Brain aneurysm   . Gout   . Hypertension   . Renal disorder   . Seizures ([place])    Past Surgical History:  Procedure Laterality Date  . BRAIN SURGERY     Social History   Social History  . Marital status: Divorced    Spouse name: N/A  . Number of children: N/A  . Years of education: N/A   Social History Main Topics  . Smoking status: Current Some Day Smoker  . Smokeless tobacco: Never Used  . Alcohol use No  . Drug use: No  . Sexual activity: Not Asked   Other Topics Concern  . None   Social History Narrative  . None   History reviewed. No pertinent family history. Allergies  Allergen Reactions  . Ace Inhibitors     Unsure of which one    Prior to Admission medications   Medication Sig Start Date End Date Taking? Authorizing Provider  febuxostat (ULORIC) 40 MG tablet Take 40 mg by mouth daily.   Yes [provider]  ranitidine (ZANTAC) 150 MG tablet Take 150 mg by mouth 2 (two) times daily.   Yes [provider]  zonisamide (ZONEGRAN) 100 MG capsule Take 100 mg by mouth 2 (two) times daily.   Yes [provider]  atenolol (TENORMIN) 100 MG tablet Take 100 mg by mouth daily.    [provider]  atorvastatin (LIPITOR) 20 MG tablet Take 20 mg by mouth daily.     [provider]  losartan-hydrochlorothiazide (HYZAAR) 50-12.5 MG tablet Take 1 tablet by mouth daily.    [provider]   Dg Finger Thumb Right  Result Date: 08/07/2017 CLINICAL DATA:  Right thumb pain, swelling, and pus drainage. EXAM: RIGHT THUMB 2+V COMPARISON:  None. FINDINGS: Marked soft tissue swelling is seen overlying the distal phalanx. Cortical destruction and osteomyelitis cysts is seen involving the distal phalanx, predominantly along its volar and medial aspect, consistent with osteomyelitis. No soft tissue gas or radiopaque foreign body identified. IMPRESSION: Acute osteomyelitis of distal phalanx. Electronically Signed   By: Earle Gell M.D.   On: 08/07/2017 20:55    Positive ROS: All other systems have been reviewed and were otherwise negative with the exception of those mentioned in the HPI and as above.  Physical Exam: Vitals: Refer to EMR. Constitutional:  WD, WN, NAD HEENT:  NCAT, EOMI Neuro/Psych:  Alert & oriented to person, place, and time; appropriate mood & affect Lymphatic: No generalized extremity edema or lymphadenopathy Extremities / MSK:  The extremities are normal with respect to appearance, ranges of motion, joint stability, muscle strength/tone, sensation, & perfusion except as otherwise noted:  The right thumb is swollen distally, particularly of the pulp.  There is a small area of black eschar and at least 2 draining  sinuses.  There is purulent expressible drainage.  Soft tissues about the proximal aspect of the thumb overlying the proximal phalanx are minimally swollen.  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  Radiographic changes do not appear to extend to the proximal phalanx.  The radiographic changes would appear more to be infective than gout.  Plan: Will require amputation, hopefully of just distal  phalanx, but soft tissue closure will be challenging and may require delayed Moberg flap for coverage.  Presently on Friday OR schedule at [facility] for 11am.  Please keep NPO, No Heparin.  Rayvon Char Grandville Silos, [place], [place]  [id number] Office: [phone] Mobile: [phone]  08/08/2017, 4:27 AM

## 2017-08-08 NOTE — OR Nursing (Signed)
Patient teal colored glasses were placed in a bag with patient label and attached to patient chart.

## 2017-08-09 ENCOUNTER — Other Ambulatory Visit (HOSPITAL_COMMUNITY): Payer: Self-pay

## 2017-08-09 ENCOUNTER — Inpatient Hospital Stay (HOSPITAL_COMMUNITY): Payer: Medicaid Other

## 2017-08-09 ENCOUNTER — Encounter (HOSPITAL_COMMUNITY): Payer: Self-pay | Admitting: Orthopedic Surgery

## 2017-08-09 DIAGNOSIS — N183 Chronic kidney disease, stage 3 (moderate): Secondary | ICD-10-CM

## 2017-08-09 DIAGNOSIS — N179 Acute kidney failure, unspecified: Secondary | ICD-10-CM

## 2017-08-09 DIAGNOSIS — Q613 Polycystic kidney, unspecified: Secondary | ICD-10-CM

## 2017-08-09 DIAGNOSIS — M86042 Acute hematogenous osteomyelitis, left hand: Secondary | ICD-10-CM

## 2017-08-09 LAB — COMPREHENSIVE METABOLIC PANEL
ALBUMIN: 2.5 g/dL — AB (ref 3.5–5.0)
ALK PHOS: 46 U/L (ref 38–126)
ALT: 24 U/L (ref 17–63)
AST: 19 U/L (ref 15–41)
Anion gap: 11 (ref 5–15)
BUN: 25 mg/dL — ABNORMAL HIGH (ref 6–20)
CHLORIDE: 105 mmol/L (ref 101–111)
CO2: 21 mmol/L — AB (ref 22–32)
CREATININE: 2.21 mg/dL — AB (ref 0.61–1.24)
Calcium: 8.3 mg/dL — ABNORMAL LOW (ref 8.9–10.3)
GFR calc non Af Amer: 32 mL/min — ABNORMAL LOW (ref >60)
GFR, EST AFRICAN AMERICAN: 37 mL/min — AB (ref >60)
GLUCOSE: 97 mg/dL (ref 65–99)
Potassium: 3.8 mmol/L (ref 3.5–5.1)
SODIUM: 137 mmol/L (ref 135–145)
Total Bilirubin: 1 mg/dL (ref 0.3–1.2)
Total Protein: 5.7 g/dL — ABNORMAL LOW (ref 6.5–8.1)

## 2017-08-09 MED ORDER — PRO-STAT SUGAR FREE PO LIQD
30.0000 mL | Freq: Two times a day (BID) | ORAL | Status: DC
  Administered 2017-08-09 – 2017-08-11 (×4): 30 mL via ORAL
  Filled 2017-08-09 (×4): qty 30
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
## 2017-08-09 NOTE — Progress Notes (Signed)
Patient ID: Logan Decker, male   DOB: [birth year], 56 y.o.   MRN: [id number]                                                                PROGRESS NOTE                                                                                                                                                                                                             Patient Demographics:    Logan Decker, is a 56 y.o. male, DOB - 1961/05/30, WGN:[id number]  Admit date - 08/07/2017   Admitting Physician Etta Quill, DO  Outpatient Primary MD for the patient is System, Pcp Not In  LOS - 1  Outpatient Specialists:     Chief Complaint  Patient presents with  . Wound Check       Brief Narrative    56 y.o. male with medical history significant for gout, hypertension, CTV, history of seizures or history of brain aneurysm status post clipping, presenting to the hospitalafter found to have osteomyelitis destruction of the distal phalanx, with purulent drainage. The patient required right thumb amputation through the IP joints, by Dr. Grandville Silos hand surgery. He tolerated the procedure well, without complications. The patient is being admitted for follow-up, in view of his chronic medical issues. Apparently, he is from [place], [place], and had a break 3 weeks ago, at the time, diagnosed with right thumb osteomyelitis. The patient traveled to [place], then to hear to [facility] with purulent drainage in his thumb, then seeking medical attention at [facility].  He was placed on   broad-spectrum antibiotics with vancomycin and Zosyn and surgery followed.  Denies fevers, But does report chills, denies  night sweats  Denies any respiratory complaints. Denies any chest pain or palpitations. Denies lower extremity swelling. Denies nausea, heartburn or change in bowel habits  Denies abdominal pain. Appetite is normal. Denies any dysuria. Denies abnormal skin rashes, or neuropathy. Denies any bleeding issues such as epistaxis,  hematemesis, hematuria or hematochezia. Ambulating without difficulty.    Subjective:    Logan Decker today states has no pain currently .  Doing well, afebrile.  Tolerating abx,  Pt is not sure what baseline creatinine is. Does note hx of pckd.   No headache, No chest  pain, No abdominal pain - No Nausea, No new weakness tingling or numbness, No Cough - SOB.    Assessment  & Plan :    Principal Problem:   Osteomyelitis ([place]) Active Problems:   Acute renal failure with tubular necrosis (HCC)   Wound dehiscence   Eschar of finger  Osteomyelitis of the R distal phalanx   Status post amputation through the IPJ , Gram stain => gram positive cocci in clusters Cont  Vanco and  Zosyn Cont Dilaudid  for pain control ID consult much appreciated   Acute Kidney Injury l  No confusion.  BL Cr unable to be obtained . 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 No acute issues  Continue Zonegran    Gout, no acute flare Continue Uloric     DVT prophylaxis:  Lovenox   Code Status:    Full  Family Communication:  Discussed with patient Disposition Plan: Expect patient to be discharged to home after condition improves Consults called:    Infectious disease, DT Grandville Silos, hand surgery  Admission status:  IP Medsurg      Lab Results  Component Value Date   PLT 243 08/08/2017    Antibiotics  :  Vanco, zosyn iv 10/19=>  Anti-infectives    Start     Dose/Rate Route Frequency Ordered Stop   08/08/17 2000  piperacillin-tazobactam (ZOSYN) IVPB 3.375  g     3.375 g 12.5 mL/hr over 240 Minutes Intravenous Every 8 hours 08/08/17 1601     08/08/17 1700  vancomycin (VANCOCIN) IVPB 750 mg/150 ml premix     750 mg 150 mL/hr over 60 Minutes Intravenous Every 12 hours 08/08/17 1601     08/08/17 1215  piperacillin-tazobactam (ZOSYN) IVPB 3.375 g     3.375 g 100 mL/hr over 30 Minutes Intravenous  Once 08/08/17 1201 08/08/17 1204   08/08/17 0100  vancomycin (VANCOCIN) IVPB 1000 mg/200 mL premix     1,000 mg 200 mL/hr over 60 Minutes Intravenous  Once 08/08/17 0048 08/08/17 0256   08/08/17 0100  piperacillin-tazobactam (ZOSYN) IVPB 3.375 g     3.375 g 100 mL/hr over 30 Minutes Intravenous  Once 08/08/17 0048 08/08/17 0255        Objective:   Vitals:   08/08/17 1508 08/08/17 1515 08/08/17 2300 08/09/17 0655  BP: 125/86 (!) 136/95 (!) 136/94 124/88  Pulse: 65 79 (!) 54 (!) 55  Resp: 19 19  18   Temp: (!) 97.5 F (36.4 C) 98.8 F (37.1 C) 98.2 F (36.8 C) 97.9 F (36.6 C)  TempSrc:  Oral Oral Oral  SpO2: 100% 100% 100% 98%  Weight:      Height:        Wt Readings from Last 3 Encounters:  08/08/17 72.6 kg (160 lb)     Intake/Output Summary (Last 24 hours) at 08/09/17 0717 Last data filed at 08/09/17 0400  Gross per 24 hour  Intake             1945 ml  Output  1010 ml  Net              935 ml     Physical Exam  Awake Alert, Oriented X 3, No new F.N deficits, Normal affect [place].AT,PERRAL Supple Neck,No JVD, No cervical lymphadenopathy appriciated.  Symmetrical Chest wall movement, Good air movement bilaterally, CTAB RRR,No Gallops,Rubs or new Murmurs, No Parasternal Heave +ve B.Sounds, Abd Soft, No tenderness, No organomegaly appriciated, No rebound - guarding or rigidity. No Cyanosis, Clubbing or edema, No new Rash or bruise   R thumb amputation wrapped,  Pt declined visualization    Data Review:    CBC  Recent Labs Lab 08/08/17 0126  WBC 11.4*  HGB 11.9*  HCT 36.7*  PLT 243  MCV 92.2  MCH 29.9    MCHC 32.4  RDW 14.1  LYMPHSABS 1.4  MONOABS 1.1*  EOSABS 0.3  BASOSABS 0.1    Chemistries   Recent Labs Lab 08/08/17 0126  NA 139  K 4.0  CL 112*  CO2 22  GLUCOSE 102*  BUN 39*  CREATININE 1.94*  CALCIUM 8.8*   ------------------------------------------------------------------------------------------------------------------ No results for input(s): CHOL, HDL, LDLCALC, TRIG, CHOLHDL, LDLDIRECT in the last 72 hours.  No results found for: HGBA1C ------------------------------------------------------------------------------------------------------------------ No results for input(s): TSH, T4TOTAL, T3FREE, THYROIDAB in the last 72 hours.  Invalid input(s): FREET3 ------------------------------------------------------------------------------------------------------------------ No results for input(s): VITAMINB12, FOLATE, FERRITIN, TIBC, IRON, RETICCTPCT in the last 72 hours.  Coagulation profile No results for input(s): INR, PROTIME in the last 168 hours.  No results for input(s): DDIMER in the last 72 hours.  Cardiac Enzymes No results for input(s): CKMB, TROPONINI, MYOGLOBIN in the last 168 hours.  Invalid input(s): CK ------------------------------------------------------------------------------------------------------------------ No results found for: BNP  Inpatient Medications  Scheduled Meds: . atenolol  100 mg Oral Daily  . atorvastatin  20 mg Oral Daily  . docusate sodium  100 mg Oral BID  . enoxaparin (LOVENOX) injection  40 mg Subcutaneous Q24H  . famotidine  20 mg Oral Daily  . febuxostat  40 mg Oral Daily  . multivitamin with minerals  1 tablet Oral Daily  . pneumococcal 23 valent vaccine  0.5 mL Intramuscular Tomorrow-1000  . zonisamide  100 mg Oral BID   Continuous Infusions: . sodium chloride 100 mL/hr at 08/08/17 1554  . piperacillin-tazobactam (ZOSYN)  IV 3.375 g (08/09/17 0342)  . vancomycin 750 mg (08/09/17 0509)   PRN  Meds:.HYDROcodone-acetaminophen, HYDROmorphone (DILAUDID) injection, magnesium hydroxide, ondansetron **OR** ondansetron (ZOFRAN) IV  Micro Results Recent Results (from the past 240 hour(s))  Aerobic Culture (superficial specimen)     Status: None (Preliminary result)   Collection Time: 08/08/17 11:45 AM  Result Value Ref Range Status   Specimen Description ABSCESS RIGHT THUMB  Final   Special Requests SWAB SPEC A  Final   Gram Stain   Final    ABUNDANT WBC PRESENT, PREDOMINANTLY PMN NO SQUAMOUS EPITHELIAL CELLS SEEN MODERATE GRAM POSITIVE COCCI IN CLUSTERS    Culture PENDING  Incomplete   Report Status PENDING  Incomplete    Radiology Reports Dg Finger Thumb Right  Result Date: 08/07/2017 CLINICAL DATA:  Right thumb pain, swelling, and pus drainage. EXAM: RIGHT THUMB 2+V COMPARISON:  None. FINDINGS: Marked soft tissue swelling is seen overlying the distal phalanx. Cortical destruction and osteomyelitis cysts is seen involving the distal phalanx, predominantly along its volar and medial aspect, consistent with osteomyelitis. No soft tissue gas or radiopaque foreign body identified. IMPRESSION: Acute osteomyelitis of distal phalanx. Electronically Signed   By: Jenny Reichmann  Kris Hartmann M.D.   On: 08/07/2017 20:55    Time Spent in minutes  30   Jani Gravel M.D on 08/09/2017 at 7:17 AM  Between 7am to 7pm - Pager - [phone]  After 7pm go to [email or web address] - password [facility]  [facility] -  Office  [phone]

## 2017-08-10 ENCOUNTER — Encounter (HOSPITAL_COMMUNITY): Payer: Self-pay | Admitting: *Deleted

## 2017-08-10 ENCOUNTER — Inpatient Hospital Stay (HOSPITAL_COMMUNITY): Payer: Medicaid Other

## 2017-08-10 LAB — COMPREHENSIVE METABOLIC PANEL
ALT: 20 U/L (ref 17–63)
AST: 21 U/L (ref 15–41)
Albumin: 2.3 g/dL — ABNORMAL LOW (ref 3.5–5.0)
Alkaline Phosphatase: 40 U/L (ref 38–126)
Anion gap: 6 (ref 5–15)
BUN: 26 mg/dL — AB (ref 6–20)
CHLORIDE: 111 mmol/L (ref 101–111)
CO2: 19 mmol/L — AB (ref 22–32)
CREATININE: 2.15 mg/dL — AB (ref 0.61–1.24)
Calcium: 8.2 mg/dL — ABNORMAL LOW (ref 8.9–10.3)
GFR calc non Af Amer: 33 mL/min — ABNORMAL LOW (ref >60)
GFR, EST AFRICAN AMERICAN: 39 mL/min — AB (ref >60)
Glucose, Bld: 82 mg/dL (ref 65–99)
Potassium: 3.8 mmol/L (ref 3.5–5.1)
SODIUM: 136 mmol/L (ref 135–145)
Total Bilirubin: 0.8 mg/dL (ref 0.3–1.2)
Total Protein: 4.7 g/dL — ABNORMAL LOW (ref 6.5–8.1)

## 2017-08-10 LAB — HEPATITIS PANEL, ACUTE
HCV Ab: <0.1 s/co ratio (ref 0.0–0.9)
HEP A IGM: NEGATIVE
HEP B C IGM: NEGATIVE
Hepatitis B Surface Ag: NEGATIVE

## 2017-08-10 LAB — HIV ANTIBODY (ROUTINE TESTING W REFLEX): HIV Screen 4th Generation wRfx: NONREACTIVE

## 2017-08-10 LAB — CBC
HCT: 32.9 % — ABNORMAL LOW (ref 39.0–52.0)
Hemoglobin: 11 g/dL — ABNORMAL LOW (ref 13.0–17.0)
MCH: 30.6 pg (ref 26.0–34.0)
MCHC: 33.4 g/dL (ref 30.0–36.0)
MCV: 91.4 fL (ref 78.0–100.0)
PLATELETS: 139 10*3/uL — AB (ref 150–400)
RBC: 3.6 MIL/uL — AB (ref 4.22–5.81)
RDW: 14.4 % (ref 11.5–15.5)
WBC: 6.1 10*3/uL (ref 4.0–10.5)

## 2017-08-10 LAB — SEDIMENTATION RATE: Sed Rate: 72 mm/hr — ABNORMAL HIGH (ref 0–16)

## 2017-08-10 NOTE — Progress Notes (Signed)
Patient ID: Logan Decker, male   DOB: 1960/10/28, 56 y.o.   MRN: [id number]                                                                PROGRESS NOTE                                                                                                                                                                                                             Patient Demographics:    Logan Decker, is a 56 y.o. male, DOB - 10-02-1961, PRF:[id number]  Admit date - 08/07/2017   Admitting Physician Etta Quill, DO  Outpatient Primary MD for the patient is System, Pcp Not In  LOS - 2  Outpatient Specialists:  Chief Complaint  Patient presents with  . Wound Check       Brief Narrative   56 y.o.malewith medical history significant for gout, hypertension, CTV, history of seizures or history of brain aneurysm status post clipping, presenting to the hospitalafter found to have osteomyelitis destruction of the distal phalanx, with purulent drainage. The patient required right thumb amputation through the IP joints, by Dr. Grandville Silos hand surgery. He tolerated the procedure well, without complications. The patient is being admitted for follow-up, in view of his chronic medical issues. Apparently, he is from [place], [place], and had a break 3 weeks ago, at the time, diagnosed with right thumb osteomyelitis. The patient traveled to [place], then to hear to [facility] with purulent drainage in his thumb, then seeking medical attention at [facility]. He was placed on broad-spectrum antibiotics with vancomycin and Zosyn and surgery followed. Denies fevers, But does report chills, denies night sweats Denies any respiratory complaints. Denies any chest pain or palpitations. Denies lower extremity swelling. Denies nausea, heartburn or change in bowel habits Denies abdominal pain. Appetite is normal. Denies any dysuria. Denies abnormal skin rashes, or neuropathy. Denies any bleeding issues such as epistaxis,  hematemesis, hematuria or hematochezia. Ambulating without difficulty.   Subjective:    [name] today has been doing well.  Afebrile overnite, pain controlled from amputation.     No headache, No chest pain, No abdominal pain - No Nausea, No new weakness tingling or numbness, No Cough - SOB.    Assessment  & Plan :  Principal Problem:   Osteomyelitis ([place]) Active Problems:   ARF (acute renal failure) (HCC)   Wound dehiscence   Eschar of finger   Osteomyelitis of the R distal phalanxStatus post amputation through the IPJ , Blood culture 10/19=> ngtd Gram stain => gram positive cocci in clusters, staph aureus, awaiting sensitivities Cont  Vanco iv 10/19=> Zosyn iv 10/19=>10/20 Cont Dilaudid  for pain control ID consult much appreciated   Acute Kidney Injuryl No confusion. BL Cr unable to be obtained . 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 No acute issues  Continue Zonegran    Gout, no acute flare Continue Uloric     DVT prophylaxis:Lovenox  Code Status:Full  Family Communication:Discussed with patient Disposition Plan:Expect patient to be discharged to home after condition improves Consults called:Infectious disease, DT Grandville Silos, hand surgery  Admission status:IP Medsurg     Procedures  : renal ultrasound 10/20  Anti-infectives    Start     Dose/Rate  Route Frequency Ordered Stop   08/08/17 2000  piperacillin-tazobactam (ZOSYN) IVPB 3.375 g  Status:  Discontinued     3.375 g 12.5 mL/hr over 240 Minutes Intravenous Every 8 hours 08/08/17 1601 08/09/17 1535   08/08/17 1700  vancomycin (VANCOCIN) IVPB 750 mg/150 ml premix     750 mg 150 mL/hr over 60 Minutes Intravenous Every 12 hours 08/08/17 1601     08/08/17 1215  piperacillin-tazobactam (ZOSYN) IVPB 3.375 g     3.375 g 100 mL/hr over 30 Minutes Intravenous  Once 08/08/17 1201 08/08/17 1204   08/08/17 0100  vancomycin (VANCOCIN) IVPB 1000 mg/200 mL premix     1,000 mg 200 mL/hr over 60 Minutes Intravenous  Once 08/08/17 0048 08/08/17 0256   08/08/17 0100  piperacillin-tazobactam (ZOSYN) IVPB 3.375 g     3.375 g 100 mL/hr over 30 Minutes Intravenous  Once 08/08/17 0048 08/08/17 0255        Objective:   Vitals:   08/09/17 0655 08/09/17 1339 08/09/17 2138 08/10/17 0519  BP: 124/88 134/83 133/87 119/81  Pulse: (!) 55 70 (!) 54 67  Resp: 18 18 19 17   Temp: 97.9 F (36.6 C) 97.6 F (36.4 C) 98.8 F (37.1 C) 98.9 F (37.2 C)  TempSrc: Oral Oral Oral Oral  SpO2: 98% 100% 100% 100%  Weight:      Height:        Wt Readings from Last 3 Encounters:  08/08/17 72.6 kg (160 lb)     Intake/Output Summary (Last 24 hours) at 08/10/17 0852 Last data filed at 08/10/17 0519  Gross per 24 hour  Intake              630 ml  Output              700 ml  Net              -70 ml     Physical Exam  Awake Alert, Oriented X 3, No new F.N deficits, Normal affect [place].AT,PERRAL Supple Neck,No JVD, No cervical lymphadenopathy appriciated.  Symmetrical Chest wall movement, Good air movement bilaterally, CTAB RRR,No Gallops,Rubs or new Murmurs, No Parasternal Heave +ve B.Sounds, Abd Soft, No tenderness, No organomegaly appriciated, No rebound - guarding or rigidity. No Cyanosis, Clubbing or edema, No new Rash or bruise   R thumb amputation wrapped,  Pt declined visualization    Data  Review:    CBC  Recent Labs Lab 08/08/17 0126 08/10/17 0551  WBC 11.4* 6.1  HGB 11.9* 11.0*  HCT 36.7* 32.9*  PLT 243 139*  MCV 92.2 91.4  MCH 29.9 30.6  MCHC 32.4 33.4  RDW 14.1 14.4  LYMPHSABS 1.4  --   MONOABS 1.1*  --   EOSABS 0.3  --   BASOSABS 0.1  --     Chemistries   Recent Labs Lab 08/08/17 0126 08/09/17 0812 08/10/17 0551  NA 139 137 136  K 4.0 3.8 3.8  CL 112* 105 111  CO2 22 21* 19*  GLUCOSE 102* 97 82  BUN 39* 25* 26*  CREATININE 1.94* 2.21* 2.15*  CALCIUM 8.8* 8.3* 8.2*  AST  --  19 21  ALT  --  24 20  ALKPHOS  --  46 40  BILITOT  --  1.0 0.8   ------------------------------------------------------------------------------------------------------------------ No results for input(s): CHOL, HDL, LDLCALC, TRIG, CHOLHDL, LDLDIRECT in the last 72 hours.  No results found for: HGBA1C ------------------------------------------------------------------------------------------------------------------ No results for input(s): TSH, T4TOTAL, T3FREE, THYROIDAB in the last 72 hours.  Invalid input(s): FREET3 ------------------------------------------------------------------------------------------------------------------ No results for input(s): VITAMINB12, FOLATE, FERRITIN, TIBC, IRON, RETICCTPCT in the last 72 hours.  Coagulation profile No results for input(s): INR, PROTIME in the last 168 hours.  No results for input(s): DDIMER in the last 72 hours.  Cardiac Enzymes No results for input(s): CKMB, TROPONINI, MYOGLOBIN in the last 168 hours.  Invalid input(s): CK ------------------------------------------------------------------------------------------------------------------ No results found for: BNP  Inpatient Medications  Scheduled Meds: . atenolol  100 mg Oral Daily  . atorvastatin  20 mg Oral Daily  . docusate sodium  100 mg Oral BID  . enoxaparin (LOVENOX) injection  40 mg Subcutaneous Q24H  . famotidine  20 mg Oral Daily  . febuxostat   40 mg Oral Daily  . feeding supplement (PRO-STAT SUGAR FREE 64)  30 mL Oral BID  . multivitamin with minerals  1 tablet Oral Daily  . zonisamide  100 mg Oral BID   Continuous Infusions: . sodium chloride 125 mL/hr at 08/09/17 2250  . vancomycin 750 mg (08/10/17 0530)   PRN Meds:.HYDROcodone-acetaminophen, HYDROmorphone (DILAUDID) injection, magnesium hydroxide, ondansetron **OR** ondansetron (ZOFRAN) IV  Micro Results Recent Results (from the past 240 hour(s))  Blood culture (routine x 2)     Status: None (Preliminary result)   Collection Time: 08/08/17  1:15 AM  Result Value Ref Range Status   Specimen Description BLOOD LEFT HAND  Final   Special Requests   Final    BOTTLES DRAWN AEROBIC AND ANAEROBIC Blood Culture adequate volume   Culture   Final    NO GROWTH 1 DAY Performed at [facility], [place] [address], [place], [place] [address]    Report Status PENDING  Incomplete  Blood culture (routine x 2)     Status: None (Preliminary result)   Collection Time: 08/08/17  1:30 AM  Result Value Ref Range Status   Specimen Description BLOOD RIGHT ARM  Final   Special Requests  Final    BOTTLES DRAWN AEROBIC AND ANAEROBIC Blood Culture adequate volume   Culture   Final    NO GROWTH 1 DAY Performed at [facility], [place] [address] [place], [place] [address]    Report Status PENDING  Incomplete  Aerobic Culture (superficial specimen)     Status: None (Preliminary result)   Collection Time: 08/08/17 11:45 AM  Result Value Ref Range Status   Specimen Description ABSCESS RIGHT THUMB  Final   Special Requests SWAB SPEC A  Final   Gram Stain   Final    ABUNDANT WBC PRESENT, PREDOMINANTLY PMN NO SQUAMOUS EPITHELIAL CELLS SEEN MODERATE GRAM POSITIVE COCCI IN CLUSTERS    Culture   Final    MODERATE STAPHYLOCOCCUS AUREUS SUSCEPTIBILITIES TO FOLLOW    Report Status PENDING  Incomplete    Radiology Reports US Renal  Result Date: 08/09/2017 CLINICAL DATA:  Acute renal  failure EXAM: RENAL / URINARY TRACT ULTRASOUND COMPLETE COMPARISON:  None. FINDINGS: Right Kidney: Length: 11 cm. Mild increased echogenicity is noted. Scattered cystic lesions are seen. The largest of these measures 1.7 cm. There is a complicated cystic mass lesion identified in the upper pole measuring 4.9 cm in greatest dimension. This is suspicious for neoplasm and further evaluation is recommended. No obstructive changes are seen. Left Kidney: Length: 11.5 cm. Cystic areas are noted within the left kidney as well. A few of these show complex nature with the largest of these measuring 4.7 cm in the lower pole. Bladder: Appears normal for degree of bladder distention. IMPRESSION: Bilateral cystic lesions. Complex lesions are noted within both kidneys with a more suspicious lesion noted in the upper pole of the right kidney. Further evaluation by means of nonemergent outpatient MRI is recommended when the patient is clinically improved to allow for optimum imaging. Electronically Signed   By: Inez Catalina M.D.   On: 08/09/2017 18:31   Dg Finger Thumb Right  Result Date: 08/07/2017 CLINICAL DATA:  Right thumb pain, swelling, and pus drainage. EXAM: RIGHT THUMB 2+V COMPARISON:  None. FINDINGS: Marked soft tissue swelling is seen overlying the distal phalanx. Cortical destruction and osteomyelitis cysts is seen involving the distal phalanx, predominantly along its volar and medial aspect, consistent with osteomyelitis. No soft tissue gas or radiopaque foreign body identified. IMPRESSION: Acute osteomyelitis of distal phalanx. Electronically Signed   By: Earle Gell M.D.   On: 08/07/2017 20:55    Time Spent in minutes  30   Jani Gravel M.D on 08/10/2017 at 8:52 AM  Between 7am to 7pm - Pager - [phone]  After 7pm go to [email or web address] - password [facility]  [facility] -  Office  [phone]

## 2017-08-11 ENCOUNTER — Encounter (HOSPITAL_COMMUNITY): Payer: Self-pay | Admitting: Nephrology

## 2017-08-11 DIAGNOSIS — I10 Essential (primary) hypertension: Secondary | ICD-10-CM

## 2017-08-11 DIAGNOSIS — N281 Cyst of kidney, acquired: Secondary | ICD-10-CM

## 2017-08-11 DIAGNOSIS — N183 Chronic kidney disease, stage 3 unspecified: Secondary | ICD-10-CM | POA: Diagnosis present

## 2017-08-11 DIAGNOSIS — G40909 Epilepsy, unspecified, not intractable, without status epilepticus: Secondary | ICD-10-CM

## 2017-08-11 DIAGNOSIS — Z8679 Personal history of other diseases of the circulatory system: Secondary | ICD-10-CM

## 2017-08-11 LAB — BASIC METABOLIC PANEL
Anion gap: 7 (ref 5–15)
BUN: 25 mg/dL — ABNORMAL HIGH (ref 6–20)
CHLORIDE: 111 mmol/L (ref 101–111)
CO2: 21 mmol/L — AB (ref 22–32)
Calcium: 8.4 mg/dL — ABNORMAL LOW (ref 8.9–10.3)
Creatinine, Ser: 2.04 mg/dL — ABNORMAL HIGH (ref 0.61–1.24)
GFR calc Af Amer: 41 mL/min — ABNORMAL LOW (ref >60)
GFR calc non Af Amer: 36 mL/min — ABNORMAL LOW (ref >60)
GLUCOSE: 84 mg/dL (ref 65–99)
POTASSIUM: 3.6 mmol/L (ref 3.5–5.1)
Sodium: 139 mmol/L (ref 135–145)

## 2017-08-11 LAB — AEROBIC CULTURE  (SUPERFICIAL SPECIMEN)

## 2017-08-11 LAB — AEROBIC CULTURE W GRAM STAIN (SUPERFICIAL SPECIMEN)

## 2017-08-11 MED ORDER — HYDROCODONE-ACETAMINOPHEN 5-325 MG PO TABS: 1.0000 | ORAL_TABLET | ORAL | 0 refills | Status: DC | PRN

## 2017-08-11 MED ORDER — DOXYCYCLINE HYCLATE 100 MG PO TABS
100.0000 mg | ORAL_TABLET | Freq: Two times a day (BID) | ORAL | Status: DC
  Administered 2017-08-11: 100 mg via ORAL
  Filled 2017-08-11: qty 1

## 2017-08-11 MED ORDER — DOXYCYCLINE HYCLATE 100 MG PO TABS: 100.0000 mg | ORAL_TABLET | Freq: Two times a day (BID) | ORAL | 0 refills | Status: AC

## 2017-08-11 MED ORDER — FAMOTIDINE 20 MG PO TABS: 20.0000 mg | ORAL_TABLET | Freq: Every day | ORAL | 0 refills | Status: DC

## 2017-08-11 NOTE — Progress Notes (Signed)
08-08-17:  right thumb amputation through IPJ for distal phalanx osteomyelitis.  Dressing clean/intact externally  Cx with MRSA, surgical soft-tissue margins certainly contaminated, despite all osteomyelitic bone being resected completely  Prob would benefit from short course of oral antibiotics.  Dressing is to remain in place, with surgical follow-up regarding wound healing, etc. in 7-10 days.  Micheline Rough, MD Hand Surgery  Mobile [phone]

## 2017-08-11 NOTE — Progress Notes (Signed)
    [facility] for Infectious Disease    Date of Admission:  08/07/2017   Total days of antibiotics 5           ID: Logan Decker is a 56 y.o. male with right thumb MRSA infection that sustained from trauma, non healing. POD #3 from partial amputation Principal Problem:   Osteomyelitis (HCC) Active Problems:   CKD (chronic kidney disease) stage 3, GFR 30-59 ml/min (HCC)   Acquired complex renal cyst   Essential hypertension   Seizure disorder (HCC)   History of nontraumatic rupture of cerebral aneurysm    Subjective: Afebrile, slight dryness of his lips  Medications:  . atenolol  100 mg Oral Daily  . atorvastatin  20 mg Oral Daily  . doxycycline  100 mg Oral Q12H  . enoxaparin (LOVENOX) injection  40 mg Subcutaneous Q24H  . famotidine  20 mg Oral Daily  . febuxostat  40 mg Oral Daily  . feeding supplement (PRO-STAT SUGAR FREE 64)  30 mL Oral BID  . multivitamin with minerals  1 tablet Oral Daily  . zonisamide  100 mg Oral BID    Objective: Vital signs in last 24 hours: Temp:  [97.5 F (36.4 C)-98.1 F (36.7 C)] 97.5 F (36.4 C) (10/22 1500) Pulse Rate:  [59-63] 63 (10/22 1500) Resp:  [18] 18 (10/22 1500) BP: (124-140)/(78-84) 124/78 (10/22 1500) SpO2:  [100 %] 100 % (10/22 1500) Physical Exam  Constitutional: He is oriented to person, place, and time. He appears well-developed and well-nourished. No distress.  HENT: slight bleeding superficial tear from biting of his lip Mouth/Throat: Oropharynx is clear and moist. No oropharyngeal exudate.  Cardiovascular: Normal rate, regular rhythm and normal heart sounds. Exam reveals no gallop and no friction rub.  No murmur heard.  Pulmonary/Chest: Effort normal and breath sounds normal. No respiratory distress. He has no wheezes.  Ext: right hand bandaged Neurological: He is alert and oriented to person, place, and time.  Skin: dry skin Psychiatric: He has a normal mood and affect. His behavior is normal.     Lab  Results  Recent Labs  08/10/17 0551 08/11/17 0520  WBC 6.1  --   HGB 11.0*  --   HCT 32.9*  --   NA 136 139  K 3.8 3.6  CL 111 111  CO2 19* 21*  BUN 26* 25*  CREATININE 2.15* 2.04*   Liver Panel  Recent Labs  08/09/17 0812 08/10/17 0551  PROT 5.7* 4.7*  ALBUMIN 2.5* 2.3*  AST 19 21  ALT 24 20  ALKPHOS 46 40  BILITOT 1.0 0.8   Sedimentation Rate  Recent Labs  08/10/17 0551  ESRSEDRATE 66*   Microbiology: 10/19 tissue cx =  MRSA S doxy Studies/Results: No results found.   Assessment/Plan: mrsa deep tissue infection/osteo s/p amputation = recommend to finish out course with 14 days of doxycycline 100mg  BID. Will see back in clinic to see if need to extend course.  Defer to dr Grandville Silos for wound care  [facility], [facility] for Infectious Diseases Cell: [phone] Pager: [phone]  08/11/2017, 5:05 PM

## 2017-08-11 NOTE — Progress Notes (Signed)
Dr. Roney Jaffe discharged this patient earlier today but forgot to sign the manual prescription for Vicodin. He is currently unavailable in the hospital to sign this himself. He called and requested me to complete the prescription. So as to avoid discharging delay and inconvenience to the patient, I obliged to sign the manual prescription. As discussed with Dr. Jonnie Finner, I discontinued the Acetaminophen to avoid toxicity. Dr. Jonnie Finner will update the DC summary of these changes.   Discussed with patient's RN.  Vernell Leep, MD, FACP, FHM. [facility] Pager [phone]  If 7PM-7AM, please contact night-coverage [email or web address] Password [email or web address] 08/11/2017, 5:16 PM

## 2017-08-11 NOTE — Discharge Summary (Signed)
Physician Discharge Summary  Patient ID: Logan Decker MRN: [id number] DOB/AGE: 01-26-1961 56 y.o.  Admit date: 08/07/2017 Discharge date: 08/11/2017  Important F/U Issues: 1) F/U appt with hand surgeon, pt to call and schedule for 7 days postop, instructions on DC sheet 2) Keep appt with new nephrologist, see DC instructions 3) We spoke with urology ([facility]), they will call pt and schedule appt for renal cyst eval 4)  Patient needs PCP in town and he understands this and is planning to undertake this himself 5)  Get and take your antibiotic (doxycycline) for 2 weeks starting the day of dc home    Admission Diagnoses: 1.  R thumb osteomyelitis 2.  HTN 3.  Seizure disorder 4.  Hx ruptured cerebral aneurysm 5.  CKD stage 3b  Discharge Diagnoses:  1.  R thumb osteomyelitis, sp R distal thumb amputation  2.  HTN 3.  Seizure disorder 4.  Hx ruptured cerebral aneurysm 5.  CKD stage 3b  Discharged Condition: good  Hospital Course: 1.  R thumb osteomyelitis, sp R distal thumb amputation - blood cx's negative, wound cx+MRSA, underwent distal thumb amputation by Dr Grandville Silos on 08/08/17. Rec'd IV abx in hospital. DC on po doxy 100 bid x 2 wks per ID.  F/U with Dr Grandville Silos to be sched by patient.   2.  HTN - cont atenolol, losartan/ HCTZ 3.  Seizure disorder - cont zonisamide 4.  Hx ruptured cerebral aneurysm  5.  CKD stage 3b - patient was f/b nephrologist in [place] for 8 yrs per pt. New to area.  Have made appt for nephrology clinic at [facility] in late November , see DC instructions    Consults: Hand surgeon, Dr Grandville Silos  Discharge Exam: Blood pressure 140/84, pulse (!) 59, temperature 98.1 F (36.7 C), temperature source Oral, resp. rate 18, height 5' 5.75" (1.67 m), weight 72.6 kg (160 lb), SpO2 100 %. Alert, no distress,  UP in chair Chest clear bilat Cor RRR Abd soft ntnd Ext no edema, R thumb in dressing  Disposition: dc to home  Allergies as of 08/11/2017    Reactions   Ace Inhibitors Swelling   Unsure of which one       Medication List    STOP taking these medications   ranitidine 150 MG tablet Commonly known as:  ZANTAC Replaced by:  famotidine 20 MG tablet     TAKE these medications   acetaminophen 500 MG tablet Commonly known as:  TYLENOL Take 1,000 mg by mouth every 6 (six) hours as needed for moderate pain.   atenolol 100 MG tablet Commonly known as:  TENORMIN Take 100 mg by mouth daily.   atorvastatin 20 MG tablet Commonly known as:  LIPITOR Take 20 mg by mouth daily.   doxycycline 100 MG tablet Commonly known as:  VIBRA-TABS Take 1 tablet (100 mg total) by mouth every 12 (twelve) hours.   famotidine 20 MG tablet Commonly known as:  PEPCID Take 1 tablet (20 mg total) by mouth daily. Replaces:  ranitidine 150 MG tablet   febuxostat 40 MG tablet Commonly known as:  ULORIC Take 40 mg by mouth daily.   HYDROcodone-acetaminophen 5-325 MG tablet Commonly known as:  NORCO/VICODIN Take 1-2 tablets by mouth every 4 (four) hours as needed for moderate pain.   losartan-hydrochlorothiazide 50-12.5 MG tablet Commonly known as:  HYZAAR Take 1 tablet by mouth daily.   multivitamin with minerals Tabs tablet Take 1 tablet by mouth daily.   zinc oxide 20 % ointment  Apply topically as needed for irritation (for wound healing).   zonisamide 100 MG capsule Commonly known as:  ZONEGRAN Take 100 mg by mouth 2 (two) times daily.      Follow-up Information    Schedule an appointment as soon as possible for a visit with Milly Jakob, MD.   Specialty:  Orthopedic Surgery Why:  for approx 7-10 days following surgery Contact information: [place] [address] [phone]        Mauricia Area, MD Follow up on 09/15/2017.   Specialty:  Nephrology Why:  New kidney doctor appt, Monday 11/26, please arrive at 10:15 am.   Contact information: [place] [address] [phone]            Signed: Sol Blazing 08/11/2017, 12:43 PM

## 2017-08-13 LAB — CULTURE, BLOOD (ROUTINE X 2)
Culture: NO GROWTH
Culture: NO GROWTH
Special Requests: ADEQUATE
Special Requests: ADEQUATE

## 2017-08-13 LAB — ANAEROBIC CULTURE

## 2017-08-27 ENCOUNTER — Other Ambulatory Visit: Payer: Self-pay | Admitting: Urology

## 2017-08-27 DIAGNOSIS — D49519 Neoplasm of unspecified behavior of unspecified kidney: Secondary | ICD-10-CM

## 2017-09-08 ENCOUNTER — Ambulatory Visit (HOSPITAL_COMMUNITY)
Admission: RE | Admit: 2017-09-08 | Discharge: 2017-09-08 | Disposition: A | Discharge status: Home / Self Care | Payer: Medicaid Other | Source: Ambulatory Visit | Attending: Urology | Admitting: Urology

## 2017-09-08 DIAGNOSIS — D49512 Neoplasm of unspecified behavior of left kidney: Secondary | ICD-10-CM | POA: Insufficient documentation

## 2017-09-08 DIAGNOSIS — D49519 Neoplasm of unspecified behavior of unspecified kidney: Secondary | ICD-10-CM

## 2017-09-08 DIAGNOSIS — N281 Cyst of kidney, acquired: Secondary | ICD-10-CM | POA: Insufficient documentation

## 2017-09-08 MED ORDER — GADOBENATE DIMEGLUMINE 529 MG/ML IV SOLN
15.0000 mL | Freq: Once | INTRAVENOUS | Status: AC | PRN
  Administered 2017-09-08: 15 mL via INTRAVENOUS

## 2017-11-28 NOTE — Patient Instructions (Addendum)
Logan Decker  11/28/2017   Your procedure is scheduled on: 12/05/2017   Report to [facility] Main  Entrance  Follow signs to Short Stay on first floor at 530 AM  Call this number if you have problems the morning of surgery [phone]   Remember: Do not eat food or drink liquids :After Midnight.     Take these medicines the morning of surgery with A SIP OF WATER: Amlodipine ( NOrvasc), Atenolol ( Tenormin) , ATORVASTATIN, ZONEGRAN                                You may not have any metal on your body including hair pins and              piercings  Do not wear jewelry, , lotions, powders or perfumes, deodorant                          Men may shave face and neck.   Do not bring valuables to the hospital. [place].  Contacts, dentures or bridgework may not be worn into surgery.  Leave suitcase in the car. After surgery it may be brought to your room.                     Please read over the following fact sheets you were given: _____________________________________________________________________             [facility] - Preparing for Surgery Before surgery, you can play an important role.  Because skin is not sterile, your skin needs to be as free of germs as possible.  You can reduce the number of germs on your skin by washing with CHG (chlorahexidine gluconate) soap before surgery.  CHG is an antiseptic cleaner which kills germs and bonds with the skin to continue killing germs even after washing. Please DO NOT use if you have an allergy to CHG or antibacterial soaps.  If your skin becomes reddened/irritated stop using the CHG and inform your nurse when you arrive at Short Stay. Do not shave (including legs and underarms) for at least 48 hours prior to the first CHG shower.  You may shave your face/neck. Please follow these instructions carefully:  1.  Shower with CHG Soap the night before surgery and  the  morning of Surgery.  2.  If you choose to wash your hair, wash your hair first as usual with your  normal  shampoo.  3.  After you shampoo, rinse your hair and body thoroughly to remove the  shampoo.                           4.  Use CHG as you would any other liquid soap.  You can apply chg directly  to the skin and wash                       Gently with a scrungie or clean washcloth.  5.  Apply the CHG Soap to your body ONLY FROM THE NECK DOWN.   Do not use on face/ open  Wound or open sores. Avoid contact with eyes, ears mouth and genitals (private parts).                       Wash face,  Genitals (private parts) with your normal soap.             6.  Wash thoroughly, paying special attention to the area where your surgery  will be performed.  7.  Thoroughly rinse your body with warm water from the neck down.  8.  DO NOT shower/wash with your normal soap after using and rinsing off  the CHG Soap.                9.  Pat yourself dry with a clean towel.            10.  Wear clean pajamas.            11.  Place clean sheets on your bed the night of your first shower and do not  sleep with pets. Day of Surgery : Do not apply any lotions/deodorants the morning of surgery.  Please wear clean clothes to the hospital/surgery center.  FAILURE TO FOLLOW THESE INSTRUCTIONS MAY RESULT IN THE CANCELLATION OF YOUR SURGERY PATIENT SIGNATURE_________________________________  NURSE SIGNATURE__________________________________  ________________________________________________________________________

## 2017-12-01 ENCOUNTER — Other Ambulatory Visit: Payer: Self-pay | Admitting: Urology

## 2017-12-01 ENCOUNTER — Encounter (HOSPITAL_COMMUNITY): Payer: Self-pay

## 2017-12-01 ENCOUNTER — Encounter (HOSPITAL_COMMUNITY)
Admission: RE | Admit: 2017-12-01 | Discharge: 2017-12-01 | Disposition: A | Discharge status: Home / Self Care | Payer: Medicaid Other | Source: Ambulatory Visit | Attending: Urology | Admitting: Urology

## 2017-12-01 ENCOUNTER — Other Ambulatory Visit: Payer: Self-pay

## 2017-12-01 DIAGNOSIS — Z8679 Personal history of other diseases of the circulatory system: Secondary | ICD-10-CM | POA: Insufficient documentation

## 2017-12-01 DIAGNOSIS — Z01810 Encounter for preprocedural cardiovascular examination: Secondary | ICD-10-CM | POA: Diagnosis not present

## 2017-12-01 DIAGNOSIS — N183 Chronic kidney disease, stage 3 (moderate): Secondary | ICD-10-CM | POA: Insufficient documentation

## 2017-12-01 DIAGNOSIS — R001 Bradycardia, unspecified: Secondary | ICD-10-CM | POA: Diagnosis not present

## 2017-12-01 DIAGNOSIS — M869 Osteomyelitis, unspecified: Secondary | ICD-10-CM | POA: Diagnosis not present

## 2017-12-01 DIAGNOSIS — G40909 Epilepsy, unspecified, not intractable, without status epilepticus: Secondary | ICD-10-CM | POA: Insufficient documentation

## 2017-12-01 DIAGNOSIS — Z01812 Encounter for preprocedural laboratory examination: Secondary | ICD-10-CM | POA: Diagnosis not present

## 2017-12-01 DIAGNOSIS — N281 Cyst of kidney, acquired: Secondary | ICD-10-CM | POA: Diagnosis not present

## 2017-12-01 DIAGNOSIS — I129 Hypertensive chronic kidney disease with stage 1 through stage 4 chronic kidney disease, or unspecified chronic kidney disease: Secondary | ICD-10-CM | POA: Diagnosis not present

## 2017-12-01 DIAGNOSIS — R9431 Abnormal electrocardiogram [ECG] [EKG]: Secondary | ICD-10-CM | POA: Insufficient documentation

## 2017-12-01 LAB — CBC
HCT: 47 % (ref 39.0–52.0)
HEMOGLOBIN: 16.1 g/dL (ref 13.0–17.0)
MCH: 30.6 pg (ref 26.0–34.0)
MCHC: 34.3 g/dL (ref 30.0–36.0)
MCV: 89.4 fL (ref 78.0–100.0)
PLATELETS: 231 10*3/uL (ref 150–400)
RBC: 5.26 MIL/uL (ref 4.22–5.81)
RDW: 13.1 % (ref 11.5–15.5)
WBC: 9.5 10*3/uL (ref 4.0–10.5)

## 2017-12-01 LAB — BASIC METABOLIC PANEL
Anion gap: 10 (ref 5–15)
BUN: 41 mg/dL — ABNORMAL HIGH (ref 6–20)
CHLORIDE: 103 mmol/L (ref 101–111)
CO2: 26 mmol/L (ref 22–32)
Calcium: 9.6 mg/dL (ref 8.9–10.3)
Creatinine, Ser: 2.44 mg/dL — ABNORMAL HIGH (ref 0.61–1.24)
GFR calc Af Amer: 33 mL/min — ABNORMAL LOW (ref >60)
GFR, EST NON AFRICAN AMERICAN: 28 mL/min — AB (ref >60)
GLUCOSE: 100 mg/dL — AB (ref 65–99)
POTASSIUM: 4 mmol/L (ref 3.5–5.1)
SODIUM: 139 mmol/L (ref 135–145)

## 2017-12-01 NOTE — Progress Notes (Signed)
FAXED BMET RESULTS TO DR Louis Meckel POD AND [facility] BY [employer] AND LEFT MESSAGE WITH PAM GIBSON

## 2017-12-01 NOTE — Progress Notes (Signed)
   12/01/17 1138  OBSTRUCTIVE SLEEP APNEA  Have you ever been diagnosed with sleep apnea through a sleep study? No  Do you snore loudly (loud enough to be heard through closed doors)?  1  Do you often feel tired, fatigued, or sleepy during the daytime (such as falling asleep during driving or talking to someone)? 0  Has anyone observed you stop breathing during your sleep? 1  Do you have, or are you being treated for high blood pressure? 1  BMI more than 35 kg/m2? 0  Age > 50 (1-yes) 1  Neck circumference greater than:Male 16 inches or larger, Male 17inches or larger? 0  Male Gender (Yes=1) 1  Obstructive Sleep Apnea Score 5

## 2017-12-03 ENCOUNTER — Other Ambulatory Visit (HOSPITAL_COMMUNITY): Payer: Self-pay | Admitting: *Deleted

## 2017-12-04 NOTE — Anesthesia Preprocedure Evaluation (Signed)
Anesthesia Evaluation  Patient identified by MRN, date of birth, ID band Patient awake    Reviewed: Allergy & Precautions, NPO status , Patient's Chart, lab work & pertinent test results  Airway Mallampati: II  TM Distance: >3 FB Neck ROM: Full    Dental no notable dental hx. (+) Edentulous Lower, Edentulous Upper   Pulmonary neg pulmonary ROS, Current Smoker,    Pulmonary exam normal breath sounds clear to auscultation       Cardiovascular hypertension, Pt. on medications and Pt. on home beta blockers + Peripheral Vascular Disease  Normal cardiovascular exam Rhythm:Regular Rate:Normal  Ekg 2/19 Sinus bradycardia Possible Left atrial enlargement Left ventricular hypertrophy   Neuro/Psych Seizures -, Well Controlled,  Hx of brain aneurysm negative neurological ROS  negative psych ROS   GI/Hepatic negative GI ROS, Neg liver ROS,   Endo/Other  negative endocrine ROS  Renal/GU CRFRenal diseasenegative Renal ROS  negative genitourinary   Musculoskeletal negative musculoskeletal ROS (+)   Abdominal Normal abdominal exam  (+)   Peds negative pediatric ROS (+)  Hematology negative hematology ROS (+)   Anesthesia Other Findings   Reproductive/Obstetrics negative OB ROS                             Anesthesia Physical  Anesthesia Plan  ASA: III  Anesthesia Plan: General   Post-op Pain Management:    Induction: Intravenous  PONV Risk Score and Plan: 2 and Ondansetron, Dexamethasone and Treatment may vary due to age or medical condition  Airway Management Planned: Oral ETT  Additional Equipment:   Intra-op Plan:   Post-operative Plan: Extubation in OR  Informed Consent: I have reviewed the patients History and Physical, chart, labs and discussed the procedure including the risks, benefits and alternatives for the proposed anesthesia with the patient or authorized representative who  has indicated his/her understanding and acceptance.   Dental advisory given  Plan Discussed with: CRNA and Anesthesiologist  Anesthesia Plan Comments:         Anesthesia Quick Evaluation

## 2017-12-05 ENCOUNTER — Inpatient Hospital Stay (HOSPITAL_COMMUNITY)
Admission: RE | Admit: 2017-12-05 | Discharge: 2017-12-08 | DRG: 658 | Disposition: A | Discharge status: Home / Self Care | Payer: Medicaid Other | Source: Ambulatory Visit | Attending: Urology | Admitting: Urology

## 2017-12-05 ENCOUNTER — Inpatient Hospital Stay (HOSPITAL_COMMUNITY): Payer: Medicaid Other | Admitting: Anesthesiology

## 2017-12-05 ENCOUNTER — Other Ambulatory Visit: Payer: Self-pay

## 2017-12-05 ENCOUNTER — Encounter (HOSPITAL_COMMUNITY)
Admission: RE | Disposition: A | Discharge status: Home / Self Care | Payer: Self-pay | Source: Ambulatory Visit | Attending: Urology

## 2017-12-05 ENCOUNTER — Encounter (HOSPITAL_COMMUNITY): Payer: Self-pay

## 2017-12-05 DIAGNOSIS — Z89011 Acquired absence of right thumb: Secondary | ICD-10-CM | POA: Diagnosis not present

## 2017-12-05 DIAGNOSIS — G40909 Epilepsy, unspecified, not intractable, without status epilepticus: Secondary | ICD-10-CM | POA: Diagnosis present

## 2017-12-05 DIAGNOSIS — C641 Malignant neoplasm of right kidney, except renal pelvis: Principal | ICD-10-CM | POA: Diagnosis present

## 2017-12-05 DIAGNOSIS — I739 Peripheral vascular disease, unspecified: Secondary | ICD-10-CM | POA: Diagnosis present

## 2017-12-05 DIAGNOSIS — I10 Essential (primary) hypertension: Secondary | ICD-10-CM | POA: Diagnosis present

## 2017-12-05 DIAGNOSIS — N281 Cyst of kidney, acquired: Secondary | ICD-10-CM | POA: Diagnosis present

## 2017-12-05 DIAGNOSIS — W19XXXA Unspecified fall, initial encounter: Secondary | ICD-10-CM | POA: Diagnosis not present

## 2017-12-05 DIAGNOSIS — Z79899 Other long term (current) drug therapy: Secondary | ICD-10-CM

## 2017-12-05 DIAGNOSIS — M109 Gout, unspecified: Secondary | ICD-10-CM | POA: Diagnosis present

## 2017-12-05 DIAGNOSIS — F17210 Nicotine dependence, cigarettes, uncomplicated: Secondary | ICD-10-CM | POA: Diagnosis present

## 2017-12-05 DIAGNOSIS — Z888 Allergy status to other drugs, medicaments and biological substances status: Secondary | ICD-10-CM | POA: Diagnosis not present

## 2017-12-05 DIAGNOSIS — Z8673 Personal history of transient ischemic attack (TIA), and cerebral infarction without residual deficits: Secondary | ICD-10-CM

## 2017-12-05 DIAGNOSIS — N2889 Other specified disorders of kidney and ureter: Secondary | ICD-10-CM | POA: Diagnosis present

## 2017-12-05 HISTORY — PX: ROBOTIC ASSITED PARTIAL NEPHRECTOMY: SHX6087

## 2017-12-05 LAB — HEMOGLOBIN AND HEMATOCRIT, BLOOD
HEMATOCRIT: 45.6 % (ref 39.0–52.0)
Hemoglobin: 15.6 g/dL (ref 13.0–17.0)

## 2017-12-05 LAB — HEPATIC FUNCTION PANEL
ALK PHOS: 79 U/L (ref 38–126)
ALT: 55 U/L (ref 17–63)
AST: 53 U/L — AB (ref 15–41)
Albumin: 3.9 g/dL (ref 3.5–5.0)
BILIRUBIN TOTAL: 0.5 mg/dL (ref 0.3–1.2)
Bilirubin, Direct: 0.2 mg/dL (ref 0.1–0.5)
Indirect Bilirubin: 0.3 mg/dL (ref 0.3–0.9)
Total Protein: 6.8 g/dL (ref 6.5–8.1)

## 2017-12-05 LAB — CBC
HCT: 45.8 % (ref 39.0–52.0)
HEMOGLOBIN: 15.6 g/dL (ref 13.0–17.0)
MCH: 29.5 pg (ref 26.0–34.0)
MCHC: 34.1 g/dL (ref 30.0–36.0)
MCV: 86.7 fL (ref 78.0–100.0)
Platelets: 124 10*3/uL — ABNORMAL LOW (ref 150–400)
RBC: 5.28 MIL/uL (ref 4.22–5.81)
RDW: 13.2 % (ref 11.5–15.5)
WBC: 10.6 10*3/uL — ABNORMAL HIGH (ref 4.0–10.5)

## 2017-12-05 LAB — TYPE AND SCREEN
ABO/RH(D): O POS
Antibody Screen: NEGATIVE

## 2017-12-05 LAB — ABO/RH: ABO/RH(D): O POS

## 2017-12-05 SURGERY — NEPHRECTOMY, PARTIAL, ROBOT-ASSISTED
Anesthesia: General | Laterality: Right

## 2017-12-05 MED ORDER — PROPOFOL 10 MG/ML IV BOLUS
INTRAVENOUS | Status: DC | PRN
  Administered 2017-12-05: 200 mg via INTRAVENOUS

## 2017-12-05 MED ORDER — SODIUM CHLORIDE 0.9 % IJ SOLN
INTRAMUSCULAR | Status: AC
  Filled 2017-12-05: qty 30

## 2017-12-05 MED ORDER — ROCURONIUM BROMIDE 50 MG/5ML IV SOSY
PREFILLED_SYRINGE | INTRAVENOUS | Status: DC | PRN
  Administered 2017-12-05 (×4): 10 mg via INTRAVENOUS
  Administered 2017-12-05: 50 mg via INTRAVENOUS

## 2017-12-05 MED ORDER — ACETAMINOPHEN 325 MG PO TABS: 325.0000 mg | ORAL_TABLET | ORAL | Status: DC | PRN

## 2017-12-05 MED ORDER — ACETAMINOPHEN 10 MG/ML IV SOLN
1000.0000 mg | Freq: Four times a day (QID) | INTRAVENOUS | Status: DC
  Administered 2017-12-05 – 2017-12-06 (×3): 1000 mg via INTRAVENOUS
  Filled 2017-12-05 (×3): qty 100

## 2017-12-05 MED ORDER — FENTANYL CITRATE (PF) 100 MCG/2ML IJ SOLN
25.0000 ug | INTRAMUSCULAR | Status: DC | PRN
  Administered 2017-12-05 (×2): 25 ug via INTRAVENOUS

## 2017-12-05 MED ORDER — CEFAZOLIN SODIUM-DEXTROSE 2-4 GM/100ML-% IV SOLN
INTRAVENOUS | Status: AC
  Filled 2017-12-05: qty 100

## 2017-12-05 MED ORDER — LIDOCAINE 2% (20 MG/ML) 5 ML SYRINGE
INTRAMUSCULAR | Status: DC | PRN
  Administered 2017-12-05: 60 mg via INTRAVENOUS

## 2017-12-05 MED ORDER — LACTATED RINGERS IR SOLN
Status: DC | PRN
  Administered 2017-12-05: 1000 mL

## 2017-12-05 MED ORDER — ONDANSETRON HCL 4 MG/2ML IJ SOLN
INTRAMUSCULAR | Status: DC | PRN
  Administered 2017-12-05: 4 mg via INTRAVENOUS

## 2017-12-05 MED ORDER — ONDANSETRON HCL 4 MG/2ML IJ SOLN: 4.0000 mg | Freq: Once | INTRAMUSCULAR | Status: DC | PRN

## 2017-12-05 MED ORDER — EPHEDRINE 5 MG/ML INJ
INTRAVENOUS | Status: AC
  Filled 2017-12-05: qty 10

## 2017-12-05 MED ORDER — PROPOFOL 10 MG/ML IV BOLUS
INTRAVENOUS | Status: AC
  Filled 2017-12-05: qty 20

## 2017-12-05 MED ORDER — MIDAZOLAM HCL 2 MG/2ML IJ SOLN
INTRAMUSCULAR | Status: AC
  Filled 2017-12-05: qty 2

## 2017-12-05 MED ORDER — HYDROMORPHONE HCL 1 MG/ML IJ SOLN
0.5000 mg | INTRAMUSCULAR | Status: DC | PRN
  Administered 2017-12-05 – 2017-12-07 (×4): 1 mg via INTRAVENOUS
  Filled 2017-12-05 (×5): qty 1

## 2017-12-05 MED ORDER — SODIUM CHLORIDE 0.9 % IV SOLN
INTRAVENOUS | Status: DC | PRN
  Administered 2017-12-05: 25 ug/min via INTRAVENOUS

## 2017-12-05 MED ORDER — FENTANYL CITRATE (PF) 100 MCG/2ML IJ SOLN
INTRAMUSCULAR | Status: DC | PRN
  Administered 2017-12-05 (×4): 50 ug via INTRAVENOUS

## 2017-12-05 MED ORDER — DIPHENHYDRAMINE HCL 12.5 MG/5ML PO ELIX: 12.5000 mg | ORAL_SOLUTION | Freq: Four times a day (QID) | ORAL | Status: DC | PRN

## 2017-12-05 MED ORDER — FENTANYL CITRATE (PF) 250 MCG/5ML IJ SOLN
INTRAMUSCULAR | Status: AC
  Filled 2017-12-05: qty 5

## 2017-12-05 MED ORDER — HEMOSTATIC AGENTS (NO CHARGE) OPTIME
TOPICAL | Status: DC | PRN
  Administered 2017-12-05: 1

## 2017-12-05 MED ORDER — STERILE WATER FOR IRRIGATION IR SOLN
Status: DC | PRN
  Administered 2017-12-05: 1000 mL

## 2017-12-05 MED ORDER — AMLODIPINE BESYLATE 10 MG PO TABS
10.0000 mg | ORAL_TABLET | Freq: Every day | ORAL | Status: DC
  Administered 2017-12-06 – 2017-12-08 (×3): 10 mg via ORAL
  Filled 2017-12-05 (×3): qty 1

## 2017-12-05 MED ORDER — TRAMADOL HCL 50 MG PO TABS: 50.0000 mg | ORAL_TABLET | Freq: Four times a day (QID) | ORAL | 0 refills | Status: DC | PRN

## 2017-12-05 MED ORDER — KETOROLAC TROMETHAMINE 30 MG/ML IJ SOLN: 30.0000 mg | Freq: Once | INTRAMUSCULAR | Status: DC | PRN

## 2017-12-05 MED ORDER — ZONISAMIDE 100 MG PO CAPS
100.0000 mg | ORAL_CAPSULE | Freq: Two times a day (BID) | ORAL | Status: DC
  Administered 2017-12-05 – 2017-12-08 (×6): 100 mg via ORAL
  Filled 2017-12-05 (×6): qty 1

## 2017-12-05 MED ORDER — ONDANSETRON HCL 4 MG/2ML IJ SOLN
INTRAMUSCULAR | Status: AC
  Filled 2017-12-05: qty 2

## 2017-12-05 MED ORDER — DEXAMETHASONE SODIUM PHOSPHATE 10 MG/ML IJ SOLN
INTRAMUSCULAR | Status: AC
  Filled 2017-12-05: qty 1

## 2017-12-05 MED ORDER — SODIUM CHLORIDE 0.9 % IJ SOLN
INTRAMUSCULAR | Status: AC
  Filled 2017-12-05: qty 20

## 2017-12-05 MED ORDER — ATORVASTATIN CALCIUM 20 MG PO TABS
20.0000 mg | ORAL_TABLET | Freq: Every day | ORAL | Status: DC
  Administered 2017-12-06 – 2017-12-08 (×3): 20 mg via ORAL
  Filled 2017-12-05 (×3): qty 1

## 2017-12-05 MED ORDER — EPHEDRINE SULFATE 50 MG/ML IJ SOLN
INTRAMUSCULAR | Status: DC | PRN
  Administered 2017-12-05 (×4): 5 mg via INTRAVENOUS

## 2017-12-05 MED ORDER — ONDANSETRON HCL 4 MG/2ML IJ SOLN: 4.0000 mg | INTRAMUSCULAR | Status: DC | PRN

## 2017-12-05 MED ORDER — ROCURONIUM BROMIDE 10 MG/ML (PF) SYRINGE
PREFILLED_SYRINGE | INTRAVENOUS | Status: AC
  Filled 2017-12-05: qty 5

## 2017-12-05 MED ORDER — BUPIVACAINE LIPOSOME 1.3 % IJ SUSP
20.0000 mL | Freq: Once | INTRAMUSCULAR | Status: AC
  Administered 2017-12-05: 20 mL
  Filled 2017-12-05: qty 20

## 2017-12-05 MED ORDER — OXYCODONE HCL 5 MG/5ML PO SOLN
5.0000 mg | Freq: Once | ORAL | Status: DC | PRN
  Filled 2017-12-05: qty 5

## 2017-12-05 MED ORDER — SODIUM CHLORIDE 0.9 % IJ SOLN
INTRAMUSCULAR | Status: DC | PRN
  Administered 2017-12-05: 50 mL

## 2017-12-05 MED ORDER — DEXTROSE-NACL 5-0.45 % IV SOLN
INTRAVENOUS | Status: DC
  Administered 2017-12-05 (×2): via INTRAVENOUS

## 2017-12-05 MED ORDER — PHENYLEPHRINE HCL 10 MG/ML IJ SOLN
INTRAMUSCULAR | Status: AC
  Filled 2017-12-05: qty 1

## 2017-12-05 MED ORDER — OXYCODONE HCL 5 MG PO TABS: 5.0000 mg | ORAL_TABLET | Freq: Once | ORAL | Status: DC | PRN

## 2017-12-05 MED ORDER — ACETAMINOPHEN 10 MG/ML IV SOLN
INTRAVENOUS | Status: AC
  Filled 2017-12-05: qty 100

## 2017-12-05 MED ORDER — FENTANYL CITRATE (PF) 100 MCG/2ML IJ SOLN
INTRAMUSCULAR | Status: AC
  Filled 2017-12-05: qty 2

## 2017-12-05 MED ORDER — SUGAMMADEX SODIUM 200 MG/2ML IV SOLN
INTRAVENOUS | Status: AC
  Filled 2017-12-05: qty 2

## 2017-12-05 MED ORDER — LIDOCAINE 2% (20 MG/ML) 5 ML SYRINGE
INTRAMUSCULAR | Status: AC
  Filled 2017-12-05: qty 5

## 2017-12-05 MED ORDER — MEPERIDINE HCL 50 MG/ML IJ SOLN: 6.2500 mg | INTRAMUSCULAR | Status: DC | PRN

## 2017-12-05 MED ORDER — DEXAMETHASONE SODIUM PHOSPHATE 10 MG/ML IJ SOLN
INTRAMUSCULAR | Status: DC | PRN
  Administered 2017-12-05: 10 mg via INTRAVENOUS

## 2017-12-05 MED ORDER — CEFAZOLIN SODIUM-DEXTROSE 2-4 GM/100ML-% IV SOLN
2.0000 g | INTRAVENOUS | Status: AC
  Administered 2017-12-05: 2 g via INTRAVENOUS

## 2017-12-05 MED ORDER — SUCCINYLCHOLINE CHLORIDE 200 MG/10ML IV SOSY
PREFILLED_SYRINGE | INTRAVENOUS | Status: AC
  Filled 2017-12-05: qty 10

## 2017-12-05 MED ORDER — BUPIVACAINE-EPINEPHRINE (PF) 0.5% -1:200000 IJ SOLN
INTRAMUSCULAR | Status: AC
  Filled 2017-12-05: qty 30

## 2017-12-05 MED ORDER — ATENOLOL 50 MG PO TABS
100.0000 mg | ORAL_TABLET | Freq: Every day | ORAL | Status: DC
  Administered 2017-12-06 – 2017-12-08 (×3): 100 mg via ORAL
  Filled 2017-12-05 (×3): qty 2

## 2017-12-05 MED ORDER — TRAMADOL HCL 50 MG PO TABS
ORAL_TABLET | ORAL | Status: AC
  Administered 2017-12-05: 50 mg via ORAL
  Filled 2017-12-05: qty 1

## 2017-12-05 MED ORDER — MIDAZOLAM HCL 5 MG/5ML IJ SOLN
INTRAMUSCULAR | Status: DC | PRN
  Administered 2017-12-05: 1 mg via INTRAVENOUS

## 2017-12-05 MED ORDER — BUPIVACAINE-EPINEPHRINE 0.5% -1:200000 IJ SOLN
INTRAMUSCULAR | Status: DC | PRN
  Administered 2017-12-05: 32 mL

## 2017-12-05 MED ORDER — SUGAMMADEX SODIUM 200 MG/2ML IV SOLN
INTRAVENOUS | Status: DC | PRN
  Administered 2017-12-05: 200 mg via INTRAVENOUS

## 2017-12-05 MED ORDER — LACTATED RINGERS IV SOLN
INTRAVENOUS | Status: DC
  Administered 2017-12-05: 10:00:00 via INTRAVENOUS
  Administered 2017-12-05: 1000 mL via INTRAVENOUS

## 2017-12-05 MED ORDER — TRAMADOL HCL 50 MG PO TABS
50.0000 mg | ORAL_TABLET | Freq: Four times a day (QID) | ORAL | Status: DC | PRN
  Administered 2017-12-05 – 2017-12-06 (×3): 50 mg via ORAL
  Administered 2017-12-07 (×2): 100 mg via ORAL
  Filled 2017-12-05: qty 2
  Filled 2017-12-05: qty 1
  Filled 2017-12-05 (×2): qty 2

## 2017-12-05 MED ORDER — DIPHENHYDRAMINE HCL 50 MG/ML IJ SOLN: 12.5000 mg | Freq: Four times a day (QID) | INTRAMUSCULAR | Status: DC | PRN

## 2017-12-05 MED ORDER — SUCCINYLCHOLINE CHLORIDE 200 MG/10ML IV SOSY
PREFILLED_SYRINGE | INTRAVENOUS | Status: DC | PRN
  Administered 2017-12-05: 120 mg via INTRAVENOUS

## 2017-12-05 MED ORDER — ACETAMINOPHEN 160 MG/5ML PO SOLN: 325.0000 mg | ORAL | Status: DC | PRN

## 2017-12-05 SURGICAL SUPPLY — 64 items
APPLICATOR ARISTA FLEXITIP XL (MISCELLANEOUS) IMPLANT
APPLICATOR SURGIFLO ENDO (HEMOSTASIS) ×3 IMPLANT
CHLORAPREP W/TINT 26ML (MISCELLANEOUS) ×3 IMPLANT
CLIP VESOLOCK LG 6/CT PURPLE (CLIP) ×6 IMPLANT
CLIP VESOLOCK MED LG 6/CT (CLIP) ×9 IMPLANT
CLIP VESOLOCK XL 6/CT (CLIP) IMPLANT
COVER SURGICAL LIGHT HANDLE (MISCELLANEOUS) ×3 IMPLANT
COVER TIP SHEARS 8 DVNC (MISCELLANEOUS) ×1 IMPLANT
COVER TIP SHEARS 8MM DA VINCI (MISCELLANEOUS) ×2
DECANTER SPIKE VIAL GLASS SM (MISCELLANEOUS) IMPLANT
DERMABOND ADVANCED (GAUZE/BANDAGES/DRESSINGS) ×2
DERMABOND ADVANCED .7 DNX12 (GAUZE/BANDAGES/DRESSINGS) ×1 IMPLANT
DRAIN CHANNEL 15F RND FF 3/16 (WOUND CARE) ×3 IMPLANT
DRAPE ARM DVNC X/XI (DISPOSABLE) ×4 IMPLANT
DRAPE COLUMN DVNC XI (DISPOSABLE) ×1 IMPLANT
DRAPE DA VINCI XI ARM (DISPOSABLE) ×8
DRAPE DA VINCI XI COLUMN (DISPOSABLE) ×2
DRAPE INCISE IOBAN 66X45 STRL (DRAPES) ×3 IMPLANT
DRAPE SHEET LG 3/4 BI-LAMINATE (DRAPES) ×3 IMPLANT
ELECT PENCIL ROCKER SW 15FT (MISCELLANEOUS) ×3 IMPLANT
ELECT REM PT RETURN 15FT ADLT (MISCELLANEOUS) ×6 IMPLANT
EVACUATOR SILICONE 100CC (DRAIN) ×3 IMPLANT
GLOVE BIO SURGEON STRL SZ 6.5 (GLOVE) ×2 IMPLANT
GLOVE BIO SURGEONS STRL SZ 6.5 (GLOVE) ×1
GLOVE BIOGEL M STRL SZ7.5 (GLOVE) ×6 IMPLANT
GOWN STRL REUS W/TWL LRG LVL3 (GOWN DISPOSABLE) ×9 IMPLANT
HEMOSTAT ARISTA ABSORB 3G PWDR (MISCELLANEOUS) IMPLANT
IRRIG SUCT STRYKERFLOW 2 WTIP (MISCELLANEOUS)
IRRIGATION SUCT STRKRFLW 2 WTP (MISCELLANEOUS) IMPLANT
KIT BASIN OR (CUSTOM PROCEDURE TRAY) ×3 IMPLANT
LOOP VESSEL MAXI BLUE (MISCELLANEOUS) IMPLANT
MARKER SKIN DUAL TIP RULER LAB (MISCELLANEOUS) ×3 IMPLANT
NEEDLE INSUFFLATION 14GA 120MM (NEEDLE) ×3 IMPLANT
NEEDLE SPNL 18GX3.5 QUINCKE PK (NEEDLE) ×3 IMPLANT
NS IRRIG 1000ML POUR BTL (IV SOLUTION) ×3 IMPLANT
PAD POSITIONING PINK XL (MISCELLANEOUS) ×3 IMPLANT
PORT ACCESS TROCAR AIRSEAL 12 (TROCAR) ×1 IMPLANT
PORT ACCESS TROCAR AIRSEAL 5M (TROCAR) ×2
POSITIONER SURGICAL ARM (MISCELLANEOUS) ×3 IMPLANT
POUCH SPECIMEN RETRIEVAL 10MM (ENDOMECHANICALS) ×3 IMPLANT
SEAL CANN UNIV 5-8 DVNC XI (MISCELLANEOUS) ×4 IMPLANT
SEAL XI 5MM-8MM UNIVERSAL (MISCELLANEOUS) ×8
SET TRI-LUMEN FLTR TB AIRSEAL (TUBING) ×3 IMPLANT
SOLUTION ELECTROLUBE (MISCELLANEOUS) ×3 IMPLANT
SPOGE SURGIFLO 8M (HEMOSTASIS)
SPONGE SURGIFLO 8M (HEMOSTASIS) IMPLANT
SURGIFLO W/THROMBIN 8M KIT (HEMOSTASIS) ×3 IMPLANT
SUT ETHILON 3 0 PS 1 (SUTURE) ×3 IMPLANT
SUT MNCRL AB 4-0 PS2 18 (SUTURE) ×6 IMPLANT
SUT V-LOC BARB 180 2/0GR6 GS22 (SUTURE) ×6
SUT VIC AB 0 CT1 27 (SUTURE)
SUT VIC AB 0 CT1 27XBRD ANTBC (SUTURE) IMPLANT
SUT VICRYL 0 UR6 27IN ABS (SUTURE) IMPLANT
SUT VLOC BARB 180 ABS3/0GR12 (SUTURE) ×9
SUTURE V-LC BRB 180 2/0GR6GS22 (SUTURE) ×2 IMPLANT
SUTURE VLOC BRB 180 ABS3/0GR12 (SUTURE) ×3 IMPLANT
SYRINGE 20CC LL (MISCELLANEOUS) ×3 IMPLANT
TOWEL OR 17X26 10 PK STRL BLUE (TOWEL DISPOSABLE) ×3 IMPLANT
TOWEL OR NON WOVEN STRL DISP B (DISPOSABLE) ×3 IMPLANT
TRAY FOLEY W/METER SILVER 16FR (SET/KITS/TRAYS/PACK) ×3 IMPLANT
TRAY LAPAROSCOPIC (CUSTOM PROCEDURE TRAY) ×3 IMPLANT
TROCAR BLADELESS OPT 5 100 (ENDOMECHANICALS) ×3 IMPLANT
TROCAR XCEL 12X100 BLDLESS (ENDOMECHANICALS) IMPLANT
WATER STERILE IRR 1000ML POUR (IV SOLUTION) ×3 IMPLANT

## 2017-12-05 NOTE — H&P (Signed)
Renal Mass Surveillance  HPI: Logan Decker is a 57 year-old male established patient who is here ongoing eval and management of a renal mass.  The mass is on both sides.   The lesion(s) was first noted on approximately 08/21/2017. The mass was seen on Renal Ultrasound.   The mass was last imaged 1 week ago.   He has had no symptoms. He has not seen blood in his urine. He does have a good appetite. He is not having pain in new locations. He has not recently had unwanted weight loss.   He has not had previous abdominal surgery. The patient can walk a flight of steps.   The patient's past medical history is significant for stroke. There is not a a family history of kidney cancer. There is a family history of brain tumors (AMLs), seizures or brain aneurysm's.   MRI on 09/08/17:  5.2 cm complex cystic lesion in the lateral right kidney in the upper pole, Bosniak 3-4 cyst. 1.2 cm enhancing cystic lesion in the mid portion of the right kidney.  1.7 cm cystic enhancing lesion in the intra-polar left kidney, endophytic, anterior surface. 2.0 cm lesion in the mid polar posterior aspect of the left kidney, endophytic.     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   Tobacco Use Assessment Completed: Used Tobacco in last 30 days? Has never drank.  Drinks 1  caffeinated drink per day. Patient's occupation is/was Disabled.    REVIEW OF SYSTEMS:    GU Review Male:   Patient reports hard to postpone urination, get up at night to urinate, and erection problems. Patient denies frequent urination, burning/ pain with urination, leakage of urine, stream starts and stops, trouble starting your stream, have to strain to urinate , and penile pain.  Gastrointestinal (Upper):   Patient denies nausea, vomiting, and indigestion/ heartburn.  Gastrointestinal (Lower):   Patient denies diarrhea and constipation.  Constitutional:   Patient denies fever, night sweats, weight loss, and fatigue.  Skin:   Patient denies skin rash/ lesion and itching.  Eyes:   Patient denies blurred vision and double vision.  Ears/ Nose/ Throat:   Patient denies sore throat and sinus problems.  Hematologic/Lymphatic:   Patient denies swollen glands and easy bruising.  Cardiovascular:   Patient denies leg swelling and chest pains.  Respiratory:   Patient denies cough and shortness of breath.  Endocrine:   Patient denies excessive thirst.  Musculoskeletal:   Patient denies back pain and joint pain.  Neurological:   Patient denies headaches and dizziness.  Psychologic:   Patient denies depression and anxiety.   VITAL SIGNS:      10/07/2017 02:58 PM  Weight 170 lb / 77.11 kg  BP 132/68 mmHg  Pulse 61 /min   MULTI-SYSTEM PHYSICAL EXAMINATION:  Constitutional: Well-nourished. No physical deformities. Normally developed. Good grooming.  Neck: Neck symmetrical, not swollen. Normal tracheal position.  Respiratory: No labored breathing, no use of accessory muscles. Clear to auscultation bilaterally  Cardiovascular: Normal temperature, normal extremity pulses, no swelling, no varicosities. Regular rate and rhythm  Lymphatic: No enlargement of neck, axillae, groin.  Skin: No paleness, no jaundice, no cyanosis. No lesion, no ulcer, no rash.  Neurologic / Psychiatric: Oriented to time,  oriented to place, oriented to person. No depression, no anxiety, no agitation.  Gastrointestinal: Abdominal mass present. No tenderness, no rigidity, non obese abdomen.   Eyes: Normal conjunctivae. Normal eyelids.  Ears, Nose, Mouth, and Throat: Left ear no scars, no lesions, no masses. Right ear no scars, no lesions, no masses. Nose no scars, no lesions, no masses. Normal hearing. Normal lips.  Musculoskeletal: Normal gait and station of head and neck.     PAST DATA REVIEWED:  Source Of History:  Patient  Records Review:   Previous Patient Records  X-Ray Review: MRI Abdomen: Reviewed Films. Discussed With Patient.     PROCEDURES:          Urinalysis w/Scope Dipstick Dipstick Cont'd Micro  Color: Yellow Bilirubin: Neg WBC/hpf: 0 - 5/hpf  Appearance: Clear Ketones: Neg RBC/hpf: 0 - 2/hpf  Specific Gravity: 1.025 Blood: Neg Bacteria: NS (Not Seen)  pH: 6.5 Protein: 3+ Cystals: NS (Not Seen)  Glucose: Neg Urobilinogen: 0.2 Casts: Hyaline    Nitrites: Neg Trichomonas: Not Present    Leukocyte Esterase: Neg Mucous: Not Present      Epithelial Cells: 0 - 5/hpf      Yeast: NS (Not Seen)      Sperm: Not Present    ASSESSMENT:      ICD-10 Details  1 GU:   Benign Neo Kidney, Unspec - D30.00    PLAN:           Schedule Return Visit/Planned Activity: ASAP - Schedule Surgery          Document Letter(s):  Created for Patient: Clinical Summary    The patient has been given the natural history of renal cancer, treatment options, and I recommended surgical extirpation for this patient. I went over the robotic-assisted laparoscopic partial nephrectomy approach. I described for the patient the procedure in detail including port placement. I detailed the postoperative course including the fact that the patient would have both a drain and a Foley catheter following the surgery. I told the patient that most often patients are discharged on postoperative day one or 2. I then detailed the  expected recovery time, I told the patient that he would not be able to lift anything greater than 20 pounds for 4 weeks. I also went over the risks and benefits of this operation in great detail. We discussed the risk of injury to surrounding structures, major blood vessels and nerves, bleeding, infection, loss of kidney, and the risk of recurrent cancer.         Notes:   The patient has bilateral enhancing complex cystic renal masses. The largest one is on the right kidney which is the most endophytic and possibly the easiest to resect. He also has a small one just inferior to this on the right which we would also try to remove at the time of his surgery. We will then follow him for a time and consider operating on the patient's left side in the future. However these are seemingly significantly smaller and as such less aggressive appearing.

## 2017-12-05 NOTE — Op Note (Signed)
Preoperative diagnosis:  1. right renal mass   Postoperative diagnosis:  1. same   Procedure: 1. Robotic assisted laparoscopic right partial nephrectomy  Surgeon: Ardis Hughs, MD 1st assistant:  Debbrah Alar, PA-C Resident Surgeon: Jonna Clark, MD  Anesthesia: General  Complications: None  Intraoperative findings:  #1. Large cystic mass in mid/upper pole posterior aspect was removed. #2. Smaller cystic mass on anterior lower/mid aspect was also removed #2. Warm ischemia time 6min  EBL: 200cc  Specimens: right renal mass x 2, roof of simple cyst   Indication:  Logan Decker is a 57 y.o. patient with two suspicious cystic right renal masses.  After reviewing the management options for treatment, he elected to proceed with the above surgical procedure(s). We have discussed the potential benefits and risks of the procedure, side effects of the proposed treatment, the likelihood of the patient achieving the goals of the procedure, and any potential problems that might occur during the procedure or recuperation. Informed consent has been obtained.   Description of procedure:  An assistant was required for this surgical procedure.  The duties of the assistant included but were not limited to suctioning, passing suture, camera manipulation, retraction. This procedure would not be able to be performed without an Environmental consultant.  The patient was taken to the operating room and a general anesthetic was administered. The patient was given preoperative antibiotics, placed in the right modified flank position with care to pad all potential pressure points, and prepped and draped in the usual sterile fashion. Next a preoperative timeout was performed.  A site was selected on the right side of the umbilicus for placement of the camera port.  A small incision was then made down through the skin into the fascia.  I then used the Veress needle to insufflate the abdomen.  We then punctured  through the fascia with an 8 mm trocar.   The camera was then used to inspect the abdomen and there was no evidence of any intra-abdominal injuries or other abnormalities. The remaining abdominal ports were then placed. 8 mm robotic ports were placed in the right upper quadrant, right lower quadrant, and mid right lateral abdominal wall. A 12 mm port was placed in the upper midline for laparoscopic assistance.  All ports were placed under direct vision without difficulty. The surgical cart was then docked.   Utilizing the cautery scissors, the white line of Toldt was incised allowing the colon to be mobilized medially and the plane between the mesocolon and the anterior layer of Gerota's fascia to be developed and the kidney to be exposed. The ureter and gonadal vein were identified inferiorly and the ureter was lifted anteriorly off the psoas muscle. Dissection proceeded superiorly along the gonadal vein until the renal vein was identified. The renal hilum was then carefully isolated with a combination of blunt and Decker dissection allowing the renal arterial and venous structures to be separated and isolated in preparation for renal hilar vessel clamping.  Attention turned to the kidney and the perinephric fat surrounding the 2 renal masses was removed and the kidney was mobilized sufficiently for exposure and resection of the renal mass. The margins of both masses were then demarcated using intraoperative ultrasound.  Once the renal mass was properly isolated, preparations were made for resection of the tumor. The renal artery was then clamped with a bulldog clamp. The tumor was then excised with cold scissor dissection along with an adequate visible gross margin of normal renal parenchyma. The tumor appeared  to be excised without any gross violation of the tumor. The renal collecting system was entered during the removal of the large cystic renal mass and as such this calyx was closed by  reapproximating it with a 3-0V lock suture. A running 3-0 V-lock suture was then brought through the capsule of the kidney and run along the base of the renal defect to provide hemostasis and close any entry into the renal collecting system if present. Weck clips were used to secure this suture outside the renal capsule at the proximal and distal ends. The bulldog clamps were then removed from the renal hilar vessel.  A tongue of Gerota's fascia was then placed in the base of the large mass, and a running 2-0 V lock suture was then used to close the capsule of the kidney of both defects using a sliding clip technique which resulted in excellent hemostasis. An additional hemostatic agent (Surgiflo) was then placed into the renal defect.Surgicel was then placed over the defect.   Total warm renal ischemia time was  28 minutes. The renal tumor resection site was examined. Hemostasis appeared adequate.   The kidney was placed back into its normal anatomic position and covered with perinephric fat as needed. A # 62 Blake drain was then brought through the lateral lower port site and positioned in the perinephric space. It was secured to the skin with a nylon suture. The surgical robotic cart was undocked. The renal tumor specimen was removed intact within an endopouch retrieval bag via the camera port sites. The camera port site and the other 12 mm port site were then closed at the fascial level with 0-vicryl suture. All other laparoscopic/robotic ports were removed under direct vision and the pneumoperitoneum let down with inspection of the operative field performed and hemostasis again confirmed. All incision sites were then injected with local anesthetic and reapproximated at the skin level with 4-0 monocryl subcuticular closures. Dermabond was applied to the skin. The patient tolerated the procedure well and without complications. The patient was able to be extubated and transferred to the recovery unit  in satisfactory condition.  Ardis Hughs, M.D.

## 2017-12-05 NOTE — Discharge Instructions (Signed)

## 2017-12-05 NOTE — Transfer of Care (Signed)
Immediate Anesthesia Transfer of Care Note  Patient: Logan Decker  Procedure(s) Performed: XI ROBOTIC ASSITED LAPAROSCOPIC RIGHT PARTIAL NEPHRECTOMY (Right )  Patient Location: PACU  Anesthesia Type:General  Level of Consciousness: awake, alert  and oriented  Airway & Oxygen Therapy: Patient Spontanous Breathing and Patient connected to face mask oxygen  Post-op Assessment: Report given to RN and Post -op Vital signs reviewed and stable  Post vital signs: Reviewed and stable  Last Vitals:  Vitals:   12/05/17 0552  BP: (!) 127/93  Resp: 16  Temp: 36.6 C  SpO2: 98%    Last Pain:  Vitals:   12/05/17 0552  TempSrc: Oral      Patients Stated Pain Goal: 4 (91/79/15 0569)  Complications: No apparent anesthesia complications

## 2017-12-05 NOTE — Anesthesia Postprocedure Evaluation (Signed)
Anesthesia Post Note  Patient: Logan Decker  Procedure(s) Performed: XI ROBOTIC ASSITED LAPAROSCOPIC RIGHT PARTIAL NEPHRECTOMY (Right )     Patient location during evaluation: PACU Anesthesia Type: General Level of consciousness: awake and alert Pain management: pain level controlled Vital Signs Assessment: post-procedure vital signs reviewed and stable Respiratory status: spontaneous breathing, nonlabored ventilation, respiratory function stable and patient connected to nasal cannula oxygen Cardiovascular status: blood pressure returned to baseline and stable Postop Assessment: no apparent nausea or vomiting Anesthetic complications: no    Last Vitals:  Vitals:   12/05/17 1145 12/05/17 1200  BP: 102/69 105/79  Pulse: 60 63  Resp: 17 12  Temp: (!) 36.3 C   SpO2: 99% 100%    Last Pain:  Vitals:   12/05/17 1200  TempSrc:   PainSc: 0-No pain                 Marinna Blane

## 2017-12-05 NOTE — Anesthesia Procedure Notes (Signed)
Procedure Name: Intubation Date/Time: 12/05/2017 7:38 AM Performed by: Maxwell Caul, CRNA Pre-anesthesia Checklist: Patient identified, Emergency Drugs available, Suction available and Patient being monitored Patient Re-evaluated:Patient Re-evaluated prior to induction Oxygen Delivery Method: Circle system utilized Preoxygenation: Pre-oxygenation with 100% oxygen Induction Type: IV induction Ventilation: Mask ventilation without difficulty Laryngoscope Size: Mac and 4 Grade View: Grade I Tube type: Oral Tube size: 7.5 mm Number of attempts: 1 Airway Equipment and Method: Stylet Placement Confirmation: ETT inserted through vocal cords under direct vision,  positive ETCO2 and breath sounds checked- equal and bilateral Secured at: 21 cm Tube secured with: Tape Dental Injury: Teeth and Oropharynx as per pre-operative assessment

## 2017-12-06 ENCOUNTER — Encounter (HOSPITAL_COMMUNITY): Payer: Self-pay | Admitting: Urology

## 2017-12-06 ENCOUNTER — Inpatient Hospital Stay (HOSPITAL_COMMUNITY): Payer: Medicaid Other

## 2017-12-06 LAB — BASIC METABOLIC PANEL
ANION GAP: 10 (ref 5–15)
Anion gap: 10 (ref 5–15)
BUN: 46 mg/dL — AB (ref 6–20)
BUN: 49 mg/dL — ABNORMAL HIGH (ref 6–20)
CALCIUM: 9.2 mg/dL (ref 8.9–10.3)
CHLORIDE: 101 mmol/L (ref 101–111)
CO2: 28 mmol/L (ref 22–32)
CO2: 23 mmol/L (ref 22–32)
CREATININE: 3.35 mg/dL — AB (ref 0.61–1.24)
Calcium: 8.5 mg/dL — ABNORMAL LOW (ref 8.9–10.3)
Chloride: 100 mmol/L — ABNORMAL LOW (ref 101–111)
Creatinine, Ser: 3.73 mg/dL — ABNORMAL HIGH (ref 0.61–1.24)
GFR calc Af Amer: 22 mL/min — ABNORMAL LOW (ref >60)
GFR calc non Af Amer: 17 mL/min — ABNORMAL LOW (ref >60)
GFR, EST AFRICAN AMERICAN: 20 mL/min — AB (ref >60)
GFR, EST NON AFRICAN AMERICAN: 19 mL/min — AB (ref >60)
GLUCOSE: 112 mg/dL — AB (ref 65–99)
Glucose, Bld: 133 mg/dL — ABNORMAL HIGH (ref 65–99)
POTASSIUM: 4.3 mmol/L (ref 3.5–5.1)
POTASSIUM: 3.6 mmol/L (ref 3.5–5.1)
SODIUM: 138 mmol/L (ref 135–145)
Sodium: 134 mmol/L — ABNORMAL LOW (ref 135–145)

## 2017-12-06 LAB — HEMOGLOBIN AND HEMATOCRIT, BLOOD
HCT: 42.4 % (ref 39.0–52.0)
HCT: 39.7 % (ref 39.0–52.0)
HEMOGLOBIN: 13.8 g/dL (ref 13.0–17.0)
HEMOGLOBIN: 13.4 g/dL (ref 13.0–17.0)

## 2017-12-06 LAB — GLUCOSE, CAPILLARY: Glucose-Capillary: 106 mg/dL — ABNORMAL HIGH (ref 65–99)

## 2017-12-06 MED ORDER — SODIUM CHLORIDE 0.9 % IV SOLN: 250.0000 mL | INTRAVENOUS | Status: DC | PRN

## 2017-12-06 MED ORDER — SODIUM CHLORIDE 0.9 % IV BOLUS (SEPSIS)
1000.0000 mL | Freq: Once | INTRAVENOUS | Status: AC
  Administered 2017-12-06: 1000 mL via INTRAVENOUS

## 2017-12-06 MED ORDER — ACETAMINOPHEN 325 MG PO TABS
650.0000 mg | ORAL_TABLET | ORAL | Status: DC
  Administered 2017-12-06 – 2017-12-08 (×11): 650 mg via ORAL
  Filled 2017-12-06 (×12): qty 2

## 2017-12-06 MED ORDER — SODIUM CHLORIDE 0.9 % IV SOLN
INTRAVENOUS | Status: DC
  Administered 2017-12-06 – 2017-12-07 (×3): via INTRAVENOUS

## 2017-12-06 MED ORDER — SODIUM CHLORIDE 0.9% FLUSH: 3.0000 mL | INTRAVENOUS | Status: DC | PRN

## 2017-12-06 MED ORDER — SODIUM CHLORIDE 0.9% FLUSH
3.0000 mL | Freq: Two times a day (BID) | INTRAVENOUS | Status: DC
  Administered 2017-12-06: 3 mL via INTRAVENOUS

## 2017-12-06 NOTE — Progress Notes (Signed)
Urology Progress Note   1 Day Post-Op  Subjective: S/p R partial nephrectomy 2/15 for several renal masses. Removal of 2 masses, entry into collecting system, repaired primarily. JP drain left in place.   Patient doing overall well this AM. VSS. Drain put out 210cc since surgery, serosanguinous. Post op hgb 15.6.     Objective: Vital signs in last 24 hours: Temp:  [97.4 F (36.3 C)-98.7 F (37.1 C)] 97.7 F (36.5 C) (02/16 0507) Pulse Rate:  [52-65] 60 (02/16 0507) Resp:  [6-22] 16 (02/16 0507) BP: (94-119)/(69-87) 115/84 (02/16 0507) SpO2:  [94 %-100 %] 99 % (02/16 0507) Weight:  [80.3 kg (177 lb 0.5 oz)] 80.3 kg (177 lb 0.5 oz) (02/15 1607)  Intake/Output from previous day: 02/15 0701 - 02/16 0700 In: 4058.3 [P.O.:480; I.V.:3278.3; IV Piggyback:300] Out: 1485 [Urine:1125; Drains:210; Blood:150] Intake/Output this shift: No intake/output data recorded.  Physical Exam:  General: Alert and oriented CV: RRR Lungs: Clear Abdomen: Soft, appropirately distended and tender. Incisions clean dry and intact with dermabond in place. JP drain with serosanguinous drainage in bulb  GU: Foley with clear yellow urine in tubing  Ext: NT, No erythema  Lab Results: Recent Labs    12/05/17 1200 12/06/17 0450  HGB 15.6  15.6 13.8  HCT 45.8  45.6 42.4   BMET Recent Labs    12/06/17 0450  NA 138  K 4.3  CL 100*  CO2 28  GLUCOSE 133*  BUN 46*  CREATININE 3.35*  CALCIUM 9.2     Studies/Results: No results found.  Assessment/Plan:  57 y.o. male s/p robotic right partial nephrectomy for 2 renal masses 12/05/16. Recovering well.  - renal diet, med lock - remove foley catheter, TOV - off bed rest, ambulate and OOB to chair today - recheck labs, JP cr in AM - no chemical DVT ppx due to risk of hemorrhage post operatively  Dispo: likely to home 2/17    LOS: 1 day   FILIPPOU, PAULINE L 12/06/2017, 9:46 AM

## 2017-12-06 NOTE — Plan of Care (Signed)
Will cont to mon 

## 2017-12-06 NOTE — Progress Notes (Signed)
EKG complete per md order. Shows ST elevation. Pt denies CP, SOB.  Vs 110/74 p-78 SPO2 92% RA. Updated Jonna Clark. Labs ordered and telemetry initiated. Will cont to mon VS and labs

## 2017-12-06 NOTE — Progress Notes (Addendum)
[place] loud noise from pt room. Pt called out help. Found lying on floor in supine position. Feet inside bathroom and head lying near foot of the bed. Pt is diaphoretic and states "I got up too fast." A&OX4, denies hitting head. Only reports baseline surgical pain. Moved pt to bed with another staff member. VS charted. Ammie Dalton notified at [phone]. RRT called d/t pt hypotensive. Pt apologetic and continues to deny injury or pain related to fall. STAT labs and CT ordered. PT went down for CT, labs drawn. Will continue neuro checks and VS

## 2017-12-06 NOTE — Significant Event (Signed)
Rapid Response Event Note  Overview: Time Called: 1742 Arrival Time: 1745 Event Type: Hypotension   Bedside RN stated they found him on the floor.  Pt stated he had went to the bathroom to urinate, he stood up, and then he fainted.  Initial Focused Assessment: Pt's bed in trendelenburg position, BP in the 82'G-08'O systolic.  Pt mentation appropriate, alert and oriented x4.  MD arrived at bedside shortly after.  Bolus started per MD verbal order.  BP improved (documented in chart).  MD placed orders for labs and STAT CT scan of head.  MD stated to not transfer pt to SD, and for bedside RN to execute orders first.   Interventions: IV initiated by bedside RN, bolus initiated.  Lab at bedside.    Plan of Care (if not transferred): MD at bedside placing orders.  [place]

## 2017-12-06 NOTE — Progress Notes (Signed)
Bladder scanned 0 residual after 1st void.

## 2017-12-06 NOTE — Significant Event (Addendum)
Called by floor nursing during rapid response after patient suffered an unwitnessed fall. [place] on his right side after getting up from the toilet, patient states he passed out. Nursing heard a significant thump. Patient doesn't remember if he hit his head or not.   On arrival, patient was in reverse trendelenberg on his right side on bed with nursing staff in room. BP 80s/50s, HR wnl. O2 sat 85% on room air. 1L NS bolus ordered. Vital signs quickly improved with repositioning. Patient alert and oriented x4, abdominal exam stable from exam this AM, distended and tender to palpation, now with some guarding. JP drain with bright red serosanguinous drainage, emptied once today. Patient has been passing gas. Denies acute areas of pain besides his belly.    Stat labs, EKG, CT head ordered:  Lab Results  Component Value Date   WBC 10.6 (H) 12/05/2017   HGB 13.4 12/06/2017   HCT 39.7 12/06/2017   MCV 86.7 12/05/2017   PLT 124 (L) 12/05/2017   Sodium 135 - 145 mmol/L 134 Abnormally low    Potassium 3.5 - 5.1 mmol/L 3.6   Chloride 101 - 111 mmol/L 101   CO2 22 - 32 mmol/L 23   Glucose, Bld 65 - 99 mg/dL 112 Abnormally high    BUN 6 - 20 mg/dL 49 Abnormally high    Creatinine, Ser 0.61 - 1.24 mg/dL 3.73 Abnormally high    Calcium 8.9 - 10.3 mg/dL 8.5 Abnormally low    GFR calc non Af Amer >60 mL/min 17 Abnormally low    GFR calc Af Amer >60 mL/min 20 Abnormally low      CT head:  IMPRESSION: 1. No acute intracranial abnormality or calvarial fracture identified. 2. 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 Stable chronic postsurgical changes related to right frontal craniotomy.  EKG with ST elevations in several leads. Patient denies chest pain. Reviewed EKD with cardiology, they believe it is a normal variant and are not concerned for STEMI at this time.   Plan:  Given improvement in VS no  need for change in level of care. Will start telemetry.  Recommend nursing place bed alarm and monitor patient with ambulation. Will start IVF given increased Cr on lab work and minimal fluid intake today.  Jonna Clark, MD PGY4 - Urologic Surgery Resident

## 2017-12-07 ENCOUNTER — Encounter (HOSPITAL_COMMUNITY): Payer: Self-pay

## 2017-12-07 LAB — BASIC METABOLIC PANEL
Anion gap: 11 (ref 5–15)
BUN: 46 mg/dL — AB (ref 6–20)
CHLORIDE: 103 mmol/L (ref 101–111)
CO2: 22 mmol/L (ref 22–32)
CREATININE: 3.51 mg/dL — AB (ref 0.61–1.24)
Calcium: 8.5 mg/dL — ABNORMAL LOW (ref 8.9–10.3)
GFR calc non Af Amer: 18 mL/min — ABNORMAL LOW (ref >60)
GFR, EST AFRICAN AMERICAN: 21 mL/min — AB (ref >60)
Glucose, Bld: 94 mg/dL (ref 65–99)
Potassium: 3.7 mmol/L (ref 3.5–5.1)
Sodium: 136 mmol/L (ref 135–145)

## 2017-12-07 LAB — CBC
HEMATOCRIT: 37.9 % — AB (ref 39.0–52.0)
HEMOGLOBIN: 12.9 g/dL — AB (ref 13.0–17.0)
MCH: 30.5 pg (ref 26.0–34.0)
MCHC: 34 g/dL (ref 30.0–36.0)
MCV: 89.6 fL (ref 78.0–100.0)
Platelets: 164 10*3/uL (ref 150–400)
RBC: 4.23 MIL/uL (ref 4.22–5.81)
RDW: 13.6 % (ref 11.5–15.5)
WBC: 13.3 10*3/uL — ABNORMAL HIGH (ref 4.0–10.5)

## 2017-12-07 LAB — CREATININE, FLUID (PLEURAL, PERITONEAL, JP DRAINAGE): Creat, Fluid: 3.5 mg/dL

## 2017-12-07 MED ORDER — SENNA 8.6 MG PO TABS
1.0000 | ORAL_TABLET | Freq: Every day | ORAL | Status: DC
  Administered 2017-12-07: 8.6 mg via ORAL
  Filled 2017-12-07: qty 1

## 2017-12-07 MED ORDER — HEPARIN SODIUM (PORCINE) 5000 UNIT/ML IJ SOLN
5000.0000 [IU] | Freq: Three times a day (TID) | INTRAMUSCULAR | Status: DC
  Administered 2017-12-07 – 2017-12-08 (×3): 5000 [IU] via SUBCUTANEOUS
  Filled 2017-12-07 (×3): qty 1

## 2017-12-07 MED ORDER — DOCUSATE SODIUM 100 MG PO CAPS
100.0000 mg | ORAL_CAPSULE | Freq: Two times a day (BID) | ORAL | Status: DC
  Administered 2017-12-07 – 2017-12-08 (×3): 100 mg via ORAL
  Filled 2017-12-07 (×3): qty 1

## 2017-12-07 NOTE — Progress Notes (Signed)
Urology Progress Note   2 Days Post-Op  Subjective: S/p R partial nephrectomy 2/15 for several renal masses. Removal of 2 masses, entry into collecting system, repaired primarily. JP drain left in place.   Eventful night: patient had unwitnessed fall where he may have hit his head. CT head wnl. Lab and cardiac workup ended up being at baseline. Initially hypotensive, restarted on fluids.   Patient doing overall well this AM. VSS, hypotensive improved throughout night. Drain put out 135 over last 24 hours serosanguinous. hgb stable at 12.9 from 13.4 last night. Cr improved slightly, 3.5 from peak 3.7 yesterday. Passed TOV  Jp cr pending. Patient hasn't yet ambulated since yesterday but has gotten up to go to bathroom several times without incident Tolerating regular diet without nausea. Passing flatus. Pain well controlled.    Objective: Vital signs in last 24 hours: Temp:  [97.8 F (36.6 C)-98.4 F (36.9 C)] 98 F (36.7 C) (02/17 0549) Pulse Rate:  [51-78] 58 (02/17 0941) Resp:  [18-20] 18 (02/17 0549) BP: (77-114)/(43-78) 106/69 (02/17 0941) SpO2:  [91 %-98 %] 92 % (02/17 0549)  Intake/Output from previous day: 02/16 0701 - 02/17 0700 In: 2166.7 [P.O.:1080; I.V.:1086.7] Out: 2060 [Urine:1925; Drains:135] Intake/Output this shift: Total I/O In: -  Out: 330 [Urine:300; Drains:30]  Physical Exam:  General: Alert and oriented CV: RRR Lungs: Clear Abdomen: Soft, appropirately distended and tender. Incisions clean dry and intact with dermabond in place. JP drain with serosanguinous drainage in bulb. No bruising  Ext: NT, No erythema  Lab Results: Recent Labs    12/06/17 0450 12/06/17 1758 12/07/17 0541  HGB 13.8 13.4 12.9*  HCT 42.4 39.7 37.9*   BMET Recent Labs    12/06/17 1758 12/07/17 0541  NA 134* 136  K 3.6 3.7  CL 101 103  CO2 23 22  GLUCOSE 112* 94  BUN 49* 46*  CREATININE 3.73* 3.51*  CALCIUM 8.5* 8.5*     Studies/Results: Ct Head Wo  Contrast  Result Date: 12/06/2017 CLINICAL DATA:  56 y/o  M; unwitnessed fall today. EXAM: CT HEAD WITHOUT CONTRAST TECHNIQUE: Contiguous axial images were obtained from the base of the skull through the vertex without intravenous contrast. COMPARISON:  None. FINDINGS: Brain: No evidence of acute infarction, hemorrhage, hydrocephalus, extra-axial collection or mass lesion/mass effect. 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 Vascular: No hyperdense vessel or unexpected calcification. Skull: Chronic postsurgical changes related to right frontal craniotomy. No acute osseous abnormality identified. Sinuses/Orbits: Mild left maxillary sinus mucosal thickening. Otherwise negative. Other: None. IMPRESSION: 1. No acute intracranial abnormality or calvarial fracture identified. 2. 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 Stable chronic postsurgical changes related to right frontal craniotomy. Electronically Signed   By: Kristine Garbe M.D.   On: 12/06/2017 18:27    Assessment/Plan:  57 y.o. male s/p robotic right partial nephrectomy for 2 renal masses 12/05/16. Fell yesterday without acute injury. Cr still significant elevated, 3.5 this AM but electrolytes wnl and patient overall doing better.  - renal diet, IVF @50cc /hr, encouraged hydration. No fluid restriction - continue ambulation today, first one assisted to ensure no additional falls  - f/u JP cr  - will start DVT ppx - will observe today given events last night, d/c tomorrow if does well normalized. Will remove drain if JP cr negative for urine leak  Dispo: likely to home 2/18  LOS: 2 days   FILIPPOU, PAULINE L 12/07/2017, 10:37 AM

## 2017-12-08 ENCOUNTER — Encounter (HOSPITAL_COMMUNITY): Payer: Self-pay

## 2017-12-08 LAB — HEMOGLOBIN AND HEMATOCRIT, BLOOD
HEMATOCRIT: 37.3 % — AB (ref 39.0–52.0)
Hemoglobin: 12.5 g/dL — ABNORMAL LOW (ref 13.0–17.0)

## 2017-12-08 LAB — BASIC METABOLIC PANEL
ANION GAP: 10 (ref 5–15)
BUN: 48 mg/dL — ABNORMAL HIGH (ref 6–20)
CALCIUM: 8.8 mg/dL — AB (ref 8.9–10.3)
CO2: 23 mmol/L (ref 22–32)
CREATININE: 3.34 mg/dL — AB (ref 0.61–1.24)
Chloride: 105 mmol/L (ref 101–111)
GFR, EST AFRICAN AMERICAN: 23 mL/min — AB (ref >60)
GFR, EST NON AFRICAN AMERICAN: 19 mL/min — AB (ref >60)
Glucose, Bld: 95 mg/dL (ref 65–99)
Potassium: 3.9 mmol/L (ref 3.5–5.1)
SODIUM: 138 mmol/L (ref 135–145)

## 2017-12-08 MED ORDER — DOCUSATE SODIUM 100 MG PO CAPS: 100.0000 mg | ORAL_CAPSULE | Freq: Two times a day (BID) | ORAL | 0 refills | Status: DC

## 2017-12-08 NOTE — Care Management Note (Signed)
Case Management Note  Patient Details  Name: Logan Decker MRN: [id number] Date of Birth: 1960-12-05  Subjective/Objective: 57 y/o m admitted w/renal mass. From home.                   Action/Plan:d/c home.   Expected Discharge Date:  12/08/17               Expected Discharge Plan:  Home/Self Care  In-House Referral:     Discharge planning Services  CM Consult  Post Acute Care Choice:    Choice offered to:     DME Arranged:    DME Agency:     HH Arranged:    HH Agency:     Status of Service:  Completed, signed off  If discussed at [employer] of Stay Meetings, dates discussed:    Additional Comments:  Dessa Phi, RN 12/08/2017, 9:41 AM

## 2017-12-08 NOTE — Progress Notes (Signed)
Patient discharged home. Accompanied by family.

## 2017-12-08 NOTE — Progress Notes (Signed)
Discharge instructions given and explained to patient, he verbalized understanding, denies any pain/distress. Surgical incision clean/dry/intact, no sign of infection noted, encouraged patient to continue to assess site for any infection at home. Waiting on family to pick him up.

## 2017-12-08 NOTE — Discharge Summary (Signed)
[facility] Discharge Summary  Admit date: 12/05/2017  Discharge date and time: 12/08/17   Discharge to: Home  Discharge Service: Urology   Discharge Attending Physician:  Herrrick  Discharge  Diagnoses: Cystic renal cell carcinoma  Secondary Diagnosis: Active Problems:   Renal mass   OR Procedures: Procedure(s): XI ROBOTIC ASSITED LAPAROSCOPIC RIGHT PARTIAL NEPHRECTOMY 12/05/2017   Ancillary Procedures: None   Discharge Day Services: The patient was seen and examined by the Urology team both in the morning and immediately prior to discharge.  Vital signs and laboratory values were stable and within normal limits.  The physical exam was benign and unchanged and all surgical wounds were examined.  Discharge instructions were explained and all questions answered.  Subjective  No acute events overnight. Pain Controlled. No fever or chills.  Objective Patient Vitals for the past 8 hrs:  BP Temp Temp src Pulse Resp SpO2  12/08/17 0441 105/75 98.5 F (36.9 C) Oral 70 18 93 %   No intake/output data recorded.  General Appearance:        No acute distress Lungs:                 Normal work of breathing on room air Heart:                             Regular rate and rhythm Abdomen:                       Soft, non-tender, non-distended. Incisions clean dry and intact, dermabond in place. Drain site dressed appropirately.  Extremities:                  Warm and well perfused    Hospital Course:  The patient underwent right robotic partial nephrectomy on 12/05/2017.  The patient tolerated the procedure well, was extubated in the OR, and afterwards was taken to the PACU for routine post-surgical care. When stable the patient was transferred to the floor.   The patient did well postoperatively. Patient did have a fall on POD1, where he hit his head. Full workup including CT head returned without acute injury. EKG did show ST elevations, however patient was asymptomatic and case was  reviewed with cardiology who deemed it a normal variant and not representing ischemia. The patient's diet was slowly advanced and at the time of discharge was tolerating a regular diet. JP drain was checked for Cr prior to d/c, and value was consistent with serum, thus drain was removed prior to discharge. The patient was discharged home 3 Days Post-Op, at which point was tolerating a regular solid diet, was able to void spontaneously, have adequate pain control with P.O. pain medication, and could ambulate without difficulty. The patient will follow up with [place] for post op check.   Condition at Discharge: Improved  Discharge Medications:  Allergies as of 12/08/2017      Reactions   Ace Inhibitors Swelling   Unsure of which one  *Lotension       Medication List    STOP taking these medications   multivitamin with minerals Tabs tablet     TAKE these medications   amLODipine 10 MG tablet Commonly known as:  NORVASC Take 10 mg by mouth daily.   atenolol 100 MG tablet Commonly known as:  TENORMIN Take 100 mg by mouth daily.   atorvastatin 20 MG tablet Commonly known as:  LIPITOR Take 20 mg  by mouth daily.   docusate sodium 100 MG capsule Commonly known as:  COLACE Take 1 capsule (100 mg total) by mouth 2 (two) times daily.   losartan-hydrochlorothiazide 50-12.5 MG tablet Commonly known as:  HYZAAR Take 1 tablet by mouth daily.   traMADol 50 MG tablet Commonly known as:  ULTRAM Take 1-2 tablets (50-100 mg total) by mouth every 6 (six) hours as needed for moderate pain or severe pain.   zonisamide 100 MG capsule Commonly known as:  ZONEGRAN Take 100 mg by mouth 2 (two) times daily.        Pending Test Results: Surgical pathology   Discharge Instructions: . Discharge Instructions    Call MD for:  persistant nausea and vomiting   Complete by:  As directed    Call MD for:  redness, tenderness, or signs of infection (pain, swelling, redness, odor or green/yellow discharge  around incision site)   Complete by:  As directed    Call MD for:  severe uncontrolled pain   Complete by:  As directed    Call MD for:  temperature >100.4   Complete by:  As directed    Diet - low sodium heart healthy   Complete by:  As directed    Discharge instructions   Complete by:  As directed    Activity:  You are encouraged to ambulate frequently (about every hour during waking hours) to help prevent blood clots from forming in your legs or lungs.  However, you should not engage in any heavy lifting (> 10-15 lbs), strenuous activity, or straining. Diet: You should advance your diet as instructed by your physician.  It will be normal to have some bloating, nausea, and abdominal discomfort intermittently. Prescriptions:  You will be provided a prescription for pain medication to take as needed.  If your pain is not severe enough to require the prescription pain medication, you may take extra strength Tylenol instead which will have less side effects.  You should also take a prescribed stool softener to avoid straining with bowel movements as the prescription pain medication may constipate you. Incisions: You may remove your dressing bandages 48 hours after surgery if not removed in the hospital.  You will either have some small staples or special tissue glue at each of the incision sites. Once the bandages are removed (if present), the incisions may stay open to air.  You may start showering (but not soaking or bathing in water) the 2nd day after surgery and the incisions simply need to be patted dry after the shower.  No additional care is needed. What to call us about: You should call the office [phone]) if you develop fever > 101 or develop persistent vomiting.   Lifting restrictions   Complete by:  As directed    No heavy lifting greater than 10-15 pounds for 4-6 weeks   May shower / Bathe   Complete by:  As directed    No soaking incisions in a tub   Remove dressing in 48 hours    Complete by:  As directed    Change drain dressing as needed when soiled

## 2018-04-06 DIAGNOSIS — N289 Disorder of kidney and ureter, unspecified: Secondary | ICD-10-CM | POA: Insufficient documentation

## 2018-04-06 DIAGNOSIS — Q613 Polycystic kidney, unspecified: Secondary | ICD-10-CM | POA: Insufficient documentation

## 2018-06-30 ENCOUNTER — Other Ambulatory Visit: Payer: Self-pay | Admitting: Urology

## 2018-06-30 DIAGNOSIS — C641 Malignant neoplasm of right kidney, except renal pelvis: Secondary | ICD-10-CM

## 2018-07-01 ENCOUNTER — Other Ambulatory Visit: Payer: Self-pay | Admitting: Urology

## 2018-07-01 ENCOUNTER — Ambulatory Visit (HOSPITAL_COMMUNITY)
Admission: RE | Admit: 2018-07-01 | Discharge: 2018-07-01 | Disposition: A | Discharge status: Home / Self Care | Payer: Medicaid Other | Source: Ambulatory Visit | Attending: Urology | Admitting: Urology

## 2018-07-01 DIAGNOSIS — C641 Malignant neoplasm of right kidney, except renal pelvis: Secondary | ICD-10-CM | POA: Insufficient documentation

## 2018-07-01 DIAGNOSIS — N289 Disorder of kidney and ureter, unspecified: Secondary | ICD-10-CM | POA: Insufficient documentation

## 2018-07-01 DIAGNOSIS — Z905 Acquired absence of kidney: Secondary | ICD-10-CM | POA: Diagnosis not present

## 2018-12-09 ENCOUNTER — Other Ambulatory Visit (HOSPITAL_COMMUNITY): Payer: Self-pay | Admitting: Urology

## 2018-12-09 DIAGNOSIS — C641 Malignant neoplasm of right kidney, except renal pelvis: Secondary | ICD-10-CM

## 2018-12-28 ENCOUNTER — Ambulatory Visit (HOSPITAL_COMMUNITY)
Admission: RE | Admit: 2018-12-28 | Discharge: 2018-12-28 | Disposition: A | Discharge status: Home / Self Care | Payer: Medicaid Other | Source: Ambulatory Visit | Attending: Urology | Admitting: Urology

## 2018-12-28 DIAGNOSIS — C641 Malignant neoplasm of right kidney, except renal pelvis: Secondary | ICD-10-CM | POA: Diagnosis not present

## 2019-01-05 ENCOUNTER — Other Ambulatory Visit: Payer: Self-pay | Admitting: Urology

## 2019-02-11 ENCOUNTER — Other Ambulatory Visit (HOSPITAL_COMMUNITY): Payer: Medicaid Other

## 2019-02-19 ENCOUNTER — Inpatient Hospital Stay (HOSPITAL_COMMUNITY): Admit: 2019-02-19 | Payer: Medicaid Other | Admitting: Urology

## 2019-02-19 ENCOUNTER — Encounter (HOSPITAL_COMMUNITY): Payer: Self-pay

## 2019-02-19 SURGERY — NEPHRECTOMY, PARTIAL
Anesthesia: General | Laterality: Left

## 2020-02-29 ENCOUNTER — Other Ambulatory Visit: Payer: Self-pay | Admitting: *Deleted

## 2020-02-29 DIAGNOSIS — N186 End stage renal disease: Secondary | ICD-10-CM

## 2020-03-06 ENCOUNTER — Ambulatory Visit (HOSPITAL_COMMUNITY)
Admission: RE | Admit: 2020-03-06 | Discharge: 2020-03-06 | Disposition: A | Discharge status: Home / Self Care | Payer: Medicaid Other | Source: Ambulatory Visit | Attending: Surgery | Admitting: Surgery

## 2020-03-06 ENCOUNTER — Other Ambulatory Visit: Payer: Self-pay

## 2020-03-06 ENCOUNTER — Encounter: Payer: Self-pay | Admitting: Surgery

## 2020-03-06 ENCOUNTER — Ambulatory Visit (INDEPENDENT_AMBULATORY_CARE_PROVIDER_SITE_OTHER): Payer: Medicaid Other | Admitting: Surgery

## 2020-03-06 ENCOUNTER — Ambulatory Visit (INDEPENDENT_AMBULATORY_CARE_PROVIDER_SITE_OTHER)
Admission: RE | Admit: 2020-03-06 | Discharge: 2020-03-06 | Disposition: A | Discharge status: Home / Self Care | Payer: Medicaid Other | Source: Ambulatory Visit | Attending: Surgery | Admitting: Surgery

## 2020-03-06 VITALS — BP 147/92 | HR 67 | Temp 97.8°F | Resp 20 | Ht 66.0 in | Wt 228.0 lb

## 2020-03-06 DIAGNOSIS — N186 End stage renal disease: Secondary | ICD-10-CM

## 2020-03-06 DIAGNOSIS — N185 Chronic kidney disease, stage 5: Secondary | ICD-10-CM | POA: Diagnosis not present

## 2020-03-06 NOTE — Progress Notes (Signed)
[facility]  Patient name: Logan Decker MRN: [id number] DOB: 02-11-1961 Sex: male   REQUESTING PROVIDER:    Dr. Posey Pronto   REASON FOR CONSULT:    Dialysis access  HISTORY OF PRESENT ILLNESS:   Logan Decker is a 59 y.o. male, who is referred for dialysis access.  The patient has stage V renal insufficiency secondary to polycystic kidney disease.  He is right-handed.  He also has a significant history for hypertension and dyslipidemia.  He has undergone surgical intervention for ruptured intracranial aneurysm which is left him with some left-sided numbness and weakness.  He does have a seizure disorder as well.  PAST MEDICAL HISTORY    Past Medical History:  Diagnosis Date  . Arthritis    "knees" (08/08/2017)  . Brain aneurysm    ruptured brain aneurysm, treated surgically, was very sick, January 26, 2002, "almost died"  . CKD (chronic kidney disease) stage 3, GFR 30-59 ml/min   . Gout   . High cholesterol   . Hypertension   . Seizures ([place]) LAST SEIZURE FEW YEARS AGO   "because of scaring on brain tissue after OR for ruptured aneursym; on daily RX; last sz was in 2014-01-26" (08/08/2017)     FAMILY HISTORY   History reviewed. No pertinent family history.  SOCIAL HISTORY:   Social History   Socioeconomic History  . Marital status: Divorced    Spouse name: Not on file  . Number of children: Not on file  . Years of education: Not on file  . Highest education level: Not on file  Occupational History  . Not on file  Tobacco Use  . Smoking status: Current Some Day Smoker    Years: 35.00    Types: Cigarettes  . Smokeless tobacco: Never Used  . Tobacco comment: 08/08/2017 "~ 1 pack cigarettes/wk"  Substance and Sexual Activity  . Alcohol use: No  . Drug use: No  . Sexual activity: Never  Other Topics Concern  . Not on file  Social History Narrative  . Not on file   Social Determinants of Health   Financial Resource  Strain:   . Difficulty of Paying Living Expenses:   Food Insecurity:   . Worried About Charity fundraiser in the Last Year:   . Arboriculturist in the Last Year:   Transportation Needs:   . Film/video editor (Medical):   [name] Lack of Transportation (Non-Medical):   Physical Activity:   . Days of Exercise per Week:   . Minutes of Exercise per Session:   Stress:   . Feeling of Stress :   Social Connections:   . Frequency of Communication with Friends and Family:   . Frequency of Social Gatherings with Friends and Family:   . Attends Religious Services:   . Active Member of Clubs or Organizations:   . Attends Archivist Meetings:   [name] Marital Status:   Intimate Partner Violence:   . Fear of Current or Ex-Partner:   . Emotionally Abused:   [name] Physically Abused:   . Sexually Abused:     ALLERGIES:    Allergies  Allergen Reactions  . Ace Inhibitors Swelling    Unsure of which one  *Lotension   . Zonisamide Rash    CURRENT MEDICATIONS:    Current Outpatient Medications  Medication Sig Dispense Refill  . allopurinol (ZYLOPRIM) 100 MG tablet Take 100 mg by mouth 2 (two) times daily.    [name] amLODipine (NORVASC) 10  MG tablet Take 10 mg by mouth daily.    [name] atenolol (TENORMIN) 100 MG tablet Take 100 mg by mouth daily.    [name] atorvastatin (LIPITOR) 40 MG tablet Take 40 mg by mouth daily.     . calcitRIOL (ROCALTROL) 0.25 MCG capsule Take 0.25 mcg by mouth daily.    . furosemide (LASIX) 40 MG tablet Take by mouth.    . levETIRAcetam (KEPPRA) 500 MG tablet Take 500 mg by mouth 2 (two) times daily.    [name] docusate sodium (COLACE) 100 MG capsule Take 1 capsule (100 mg total) by mouth 2 (two) times daily. (Patient not taking: Reported on 02/03/2019) 30 capsule 0  . traMADol (ULTRAM) 50 MG tablet Take 1-2 tablets (50-100 mg total) by mouth every 6 (six) hours as needed for moderate pain or severe pain. (Patient not taking: Reported on 02/03/2019) 30 tablet 0   No current  facility-administered medications for this visit.    REVIEW OF SYSTEMS:   [X]  denotes positive finding, [ ]  denotes negative finding Cardiac  Comments:  Chest pain or chest pressure:    Shortness of breath upon exertion:    Short of breath when lying flat:    Irregular heart rhythm:        Vascular    Pain in calf, thigh, or hip brought on by ambulation:    Pain in feet at night that wakes you up from your sleep:     Blood clot in your veins:    Leg swelling:         Pulmonary    Oxygen at home:    Productive cough:     Wheezing:         Neurologic    Sudden weakness in arms or legs:     Sudden numbness in arms or legs:     Sudden onset of difficulty speaking or slurred speech:    Temporary loss of vision in one eye:     Problems with dizziness:         Gastrointestinal    Blood in stool:      Vomited blood:         Genitourinary    Burning when urinating:     Blood in urine:        Psychiatric    Major depression:         Hematologic    Bleeding problems:    Problems with blood clotting too easily:        Skin    Rashes or ulcers:        Constitutional    Fever or chills:     PHYSICAL EXAM:   There were no vitals filed for this visit.  GENERAL: The patient is a well-nourished male, in no acute distress. The vital signs are documented above. CARDIAC: There is a regular rate and rhythm.  VASCULAR: Palpable radial pulses PULMONARY: Nonlabored respirations MUSCULOSKELETAL: There are no major deformities or cyanosis. NEUROLOGIC: No focal weakness or paresthesias are detected. SKIN: There are no ulcers or rashes noted. PSYCHIATRIC: The patient has a normal affect.  STUDIES:   I have reviewed the following:  +-----------------+-------------+----------+--------------+  Right Cephalic  Diameter (cm)Depth (cm)  Findings    +-----------------+-------------+----------+--------------+  Shoulder       0.40                   +-----------------+-------------+----------+--------------+  Prox upper arm    0.40                 +-----------------+-------------+----------+--------------+  Mid upper arm    0.47                 +-----------------+-------------+----------+--------------+  Dist upper arm    0.43         branching    +-----------------+-------------+----------+--------------+  Antecubital fossa  0.49                 +-----------------+-------------+----------+--------------+  Prox forearm    0.27 / NV       branching    +-----------------+-------------+----------+--------------+  Mid forearm               not visualized  +-----------------+-------------+----------+--------------+  Dist forearm               not visualized  +-----------------+-------------+----------+--------------+  Wrist                  not visualized  +-----------------+-------------+----------+--------------+   +-----------------+-------------+----------+--------------+  Right Basilic  Diameter (cm)Depth (cm)  Findings    +-----------------+-------------+----------+--------------+  Shoulder                 not visualized  +-----------------+-------------+----------+--------------+  Prox upper arm              not visualized  +-----------------+-------------+----------+--------------+  Mid upper arm    0.22                 +-----------------+-------------+----------+--------------+  Dist upper arm   0.32 / 0.23       joins branch   +-----------------+-------------+----------+--------------+  Antecubital fossa  0.32                 +-----------------+-------------+----------+--------------+  Prox forearm     0.35                  +-----------------+-------------+----------+--------------+   +-----------------+-------------+----------+---------+  Left Cephalic  Diameter (cm)Depth (cm)Findings   +-----------------+-------------+----------+---------+  Shoulder       0.47               +-----------------+-------------+----------+---------+  Prox upper arm    0.53        branching  +-----------------+-------------+----------+---------+  Mid upper arm    0.42               +-----------------+-------------+----------+---------+  Dist upper arm   0.44 / 0.48             +-----------------+-------------+----------+---------+  Antecubital fossa  0.62        branching  +-----------------+-------------+----------+---------+  Prox forearm     0.39               +-----------------+-------------+----------+---------+  Mid forearm     0.31               +-----------------+-------------+----------+---------+  Dist forearm    0.33 / 0.27      branching  +-----------------+-------------+----------+---------+   +-----------------+------------------+----------+---------+  Left Basilic    Diameter (cm)  Depth (cm)Findings   +-----------------+------------------+----------+---------+  Mid upper arm    0.59 / 0.50       branching  +-----------------+------------------+----------+---------+  Dist upper arm  0.48 / 0.38 / 0.55     branching  +-----------------+------------------+----------+---------+  Antecubital fossa    0.42                +-----------------+------------------+----------+---------+  Prox forearm       0.39                +-----------------+------------------+----------+---------+  ASSESSMENT and PLAN   Stage V renal insufficiency: I discussed proceeding with a left  arm fistula.  In the  operating room I will decide whether or not this is a radiocephalic or a brachiocephalic fistula.  I discussed the risk of not maturity and the need for future interventions as well as the risk for steal syndrome.  All of his questions were answered.  His procedure is scheduled for Thursday, June 3.   Leia Alf, MD, FACS [facility] [phone] Pager [phone]

## 2020-03-21 ENCOUNTER — Other Ambulatory Visit (HOSPITAL_COMMUNITY)
Admission: RE | Admit: 2020-03-21 | Discharge: 2020-03-21 | Disposition: A | Discharge status: Home / Self Care | Payer: Medicaid Other | Source: Ambulatory Visit | Attending: Surgery | Admitting: Surgery

## 2020-03-21 DIAGNOSIS — Z01812 Encounter for preprocedural laboratory examination: Secondary | ICD-10-CM | POA: Diagnosis present

## 2020-03-21 DIAGNOSIS — Z20822 Contact with and (suspected) exposure to covid-19: Secondary | ICD-10-CM | POA: Insufficient documentation

## 2020-03-21 LAB — SARS CORONAVIRUS 2 (TAT 6-24 HRS): SARS Coronavirus 2: NEGATIVE

## 2020-03-22 ENCOUNTER — Encounter (HOSPITAL_COMMUNITY): Payer: Self-pay | Admitting: Vascular Surgery

## 2020-03-22 NOTE — Anesthesia Preprocedure Evaluation (Addendum)
Anesthesia Evaluation  Patient identified by MRN, date of birth, ID band Patient awake    Reviewed: Allergy & Precautions, NPO status , Patient's Chart, lab work & pertinent test results  Airway Mallampati: III   Neck ROM: Full    Dental  (+) Edentulous Upper, Edentulous Lower   Pulmonary former smoker,    breath sounds clear to auscultation       Cardiovascular hypertension,  Rhythm:Regular Rate:Normal     Neuro/Psych Seizures -,  negative psych ROS   GI/Hepatic negative GI ROS, Neg liver ROS,   Endo/Other  negative endocrine ROS  Renal/GU CRFRenal disease     Musculoskeletal  (+) Arthritis ,   Abdominal Normal abdominal exam  (+)   Peds  Hematology negative hematology ROS (+)   Anesthesia Other Findings   Reproductive/Obstetrics                           Anesthesia Physical Anesthesia Plan  ASA: III  Anesthesia Plan: MAC   Post-op Pain Management:    Induction: Intravenous  PONV Risk Score and Plan: 2 and Ondansetron and Propofol infusion  Airway Management Planned: Natural Airway and Simple Face Mask  Additional Equipment: None  Intra-op Plan:   Post-operative Plan:   Informed Consent: I have reviewed the patients History and Physical, chart, labs and discussed the procedure including the risks, benefits and alternatives for the proposed anesthesia with the patient or authorized representative who has indicated his/her understanding and acceptance.       Plan Discussed with: CRNA  Anesthesia Plan Comments: (PAT note by Karoline Caldwell, PA-C: Follows with nephrology Dr. Elmarie Shiley for hx of polycystic kidney disease, right renal cell carcinoma s/p partial right nephrectomy on 12/05/17 and stage IV-V CKD.  Follows with neurology Dr. 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 Per that note, last seizure  07/2017. No seizures as long as compliant with Keppra.  Will need DOS labs, eval, EKG. )      Anesthesia Quick Evaluation

## 2020-03-22 NOTE — Progress Notes (Signed)
Patient denies shortness of breath, fever, cough or chest pain.  PCP - Tami Ribas, PA-C Cardiologist - n/a Neurology - Dr Barnett Hatter  Chest x-ray - n/a EKG - DOS 03/23/20 Stress Test - n/a ECHO - n/a Cardiac Cath - n/a  Anesthesia review: Yes  STOP now taking any Aspirin (unless otherwise instructed by your surgeon), Aleve, Naproxen, Ibuprofen, Motrin, Advil, Goody's, BC's, all herbal medications, fish oil, and all vitamins.   Coronavirus Screening Covid test on 03/21/20 was negative.  Patient verbalized understanding of instructions that were given via phone.

## 2020-03-22 NOTE — Progress Notes (Signed)
Anesthesia Chart Review: Same day workup  Follows with nephrology Dr. Elmarie Shiley for hx of polycystic kidney disease, right renal cell carcinoma s/p partial right nephrectomy on 12/05/17 and stage IV-V CKD.  Follows with neurology Dr. 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 Per that note, last seizure 07/2017. No seizures as long as compliant with Keppra.  Will need DOS labs, eval, EKG.  Wynonia Musty [facility] Short Stay Center/Anesthesiology Phone [phone] 03/22/2020 2:47 PM

## 2020-03-23 ENCOUNTER — Encounter (HOSPITAL_COMMUNITY)
Admission: RE | Disposition: A | Discharge status: Home / Self Care | Payer: Self-pay | Source: Home / Self Care | Attending: Vascular Surgery

## 2020-03-23 ENCOUNTER — Other Ambulatory Visit: Payer: Self-pay

## 2020-03-23 ENCOUNTER — Encounter (HOSPITAL_COMMUNITY): Payer: Self-pay | Admitting: Vascular Surgery

## 2020-03-23 ENCOUNTER — Ambulatory Visit (HOSPITAL_COMMUNITY): Payer: Medicaid Other | Admitting: Physician Assistant

## 2020-03-23 ENCOUNTER — Ambulatory Visit (HOSPITAL_COMMUNITY)
Admission: RE | Admit: 2020-03-23 | Discharge: 2020-03-23 | Disposition: A | Discharge status: Home / Self Care | Payer: Medicaid Other | Attending: Vascular Surgery | Admitting: Vascular Surgery

## 2020-03-23 DIAGNOSIS — Z85528 Personal history of other malignant neoplasm of kidney: Secondary | ICD-10-CM | POA: Insufficient documentation

## 2020-03-23 DIAGNOSIS — Z87891 Personal history of nicotine dependence: Secondary | ICD-10-CM | POA: Diagnosis not present

## 2020-03-23 DIAGNOSIS — N185 Chronic kidney disease, stage 5: Secondary | ICD-10-CM | POA: Diagnosis not present

## 2020-03-23 DIAGNOSIS — G40409 Other generalized epilepsy and epileptic syndromes, not intractable, without status epilepticus: Secondary | ICD-10-CM | POA: Insufficient documentation

## 2020-03-23 DIAGNOSIS — Z905 Acquired absence of kidney: Secondary | ICD-10-CM | POA: Insufficient documentation

## 2020-03-23 DIAGNOSIS — Q613 Polycystic kidney, unspecified: Secondary | ICD-10-CM | POA: Diagnosis not present

## 2020-03-23 DIAGNOSIS — N189 Chronic kidney disease, unspecified: Secondary | ICD-10-CM

## 2020-03-23 DIAGNOSIS — I120 Hypertensive chronic kidney disease with stage 5 chronic kidney disease or end stage renal disease: Secondary | ICD-10-CM | POA: Diagnosis not present

## 2020-03-23 DIAGNOSIS — N183 Chronic kidney disease, stage 3 unspecified: Secondary | ICD-10-CM

## 2020-03-23 DIAGNOSIS — M1990 Unspecified osteoarthritis, unspecified site: Secondary | ICD-10-CM | POA: Insufficient documentation

## 2020-03-23 HISTORY — DX: Chronic kidney disease, stage 4 (severe): N18.4

## 2020-03-23 HISTORY — PX: AV FISTULA PLACEMENT: SHX1204

## 2020-03-23 LAB — POCT I-STAT, CHEM 8
BUN: 47 mg/dL — ABNORMAL HIGH (ref 6–20)
Calcium, Ion: 1.16 mmol/L (ref 1.15–1.40)
Chloride: 102 mmol/L (ref 98–111)
Creatinine, Ser: 5.9 mg/dL — ABNORMAL HIGH (ref 0.61–1.24)
Glucose, Bld: 101 mg/dL — ABNORMAL HIGH (ref 70–99)
HCT: 35 % — ABNORMAL LOW (ref 39.0–52.0)
Hemoglobin: 11.9 g/dL — ABNORMAL LOW (ref 13.0–17.0)
Potassium: 3.4 mmol/L — ABNORMAL LOW (ref 3.5–5.1)
Sodium: 138 mmol/L (ref 135–145)
TCO2: 26 mmol/L (ref 22–32)

## 2020-03-23 LAB — SURGICAL PCR SCREEN
MRSA, PCR: NEGATIVE
Staphylococcus aureus: NEGATIVE

## 2020-03-23 SURGERY — ARTERIOVENOUS (AV) FISTULA CREATION
Anesthesia: Monitor Anesthesia Care | Site: Arm Upper | Laterality: Left

## 2020-03-23 MED ORDER — MUPIROCIN 2 % EX OINT
1.0000 "application " | TOPICAL_OINTMENT | Freq: Once | CUTANEOUS | Status: AC
  Administered 2020-03-23: 1 via TOPICAL
  Filled 2020-03-23: qty 22

## 2020-03-23 MED ORDER — PROMETHAZINE HCL 25 MG/ML IJ SOLN: 6.2500 mg | INTRAMUSCULAR | Status: DC | PRN

## 2020-03-23 MED ORDER — FENTANYL CITRATE (PF) 100 MCG/2ML IJ SOLN: 25.0000 ug | INTRAMUSCULAR | Status: DC | PRN

## 2020-03-23 MED ORDER — LIDOCAINE 2% (20 MG/ML) 5 ML SYRINGE
INTRAMUSCULAR | Status: AC
  Filled 2020-03-23: qty 5

## 2020-03-23 MED ORDER — SODIUM CHLORIDE 0.9 % IV SOLN: INTRAVENOUS | Status: DC

## 2020-03-23 MED ORDER — PHENYLEPHRINE 40 MCG/ML (10ML) SYRINGE FOR IV PUSH (FOR BLOOD PRESSURE SUPPORT)
PREFILLED_SYRINGE | INTRAVENOUS | Status: DC | PRN
  Administered 2020-03-23 (×2): 120 ug via INTRAVENOUS

## 2020-03-23 MED ORDER — OXYCODONE-ACETAMINOPHEN 5-325 MG PO TABS: 1.0000 | ORAL_TABLET | Freq: Four times a day (QID) | ORAL | 0 refills | Status: DC | PRN

## 2020-03-23 MED ORDER — LIDOCAINE-EPINEPHRINE (PF) 1 %-1:200000 IJ SOLN
INTRAMUSCULAR | Status: DC | PRN
  Administered 2020-03-23: 13 mL

## 2020-03-23 MED ORDER — PROPOFOL 1000 MG/100ML IV EMUL
INTRAVENOUS | Status: AC
  Filled 2020-03-23: qty 100

## 2020-03-23 MED ORDER — CHLORHEXIDINE GLUCONATE 4 % EX LIQD: 60.0000 mL | Freq: Once | CUTANEOUS | Status: DC

## 2020-03-23 MED ORDER — 0.9 % SODIUM CHLORIDE (POUR BTL) OPTIME
TOPICAL | Status: DC | PRN
  Administered 2020-03-23: 1000 mL

## 2020-03-23 MED ORDER — LACTATED RINGERS IV SOLN: INTRAVENOUS | Status: DC

## 2020-03-23 MED ORDER — FENTANYL CITRATE (PF) 250 MCG/5ML IJ SOLN
INTRAMUSCULAR | Status: AC
  Filled 2020-03-23: qty 5

## 2020-03-23 MED ORDER — SODIUM CHLORIDE 0.9 % IV SOLN
INTRAVENOUS | Status: DC | PRN
  Administered 2020-03-23: 500 mL

## 2020-03-23 MED ORDER — SODIUM CHLORIDE 0.9 % IV SOLN
INTRAVENOUS | Status: AC
  Filled 2020-03-23: qty 1.2

## 2020-03-23 MED ORDER — CHLORHEXIDINE GLUCONATE 0.12 % MT SOLN
OROMUCOSAL | Status: AC
  Administered 2020-03-23: 15 mL
  Filled 2020-03-23: qty 15

## 2020-03-23 MED ORDER — MEPERIDINE HCL 25 MG/ML IJ SOLN: 6.2500 mg | INTRAMUSCULAR | Status: DC | PRN

## 2020-03-23 MED ORDER — CEFAZOLIN SODIUM-DEXTROSE 2-4 GM/100ML-% IV SOLN
2.0000 g | INTRAVENOUS | Status: AC
  Administered 2020-03-23: 2 g via INTRAVENOUS
  Filled 2020-03-23: qty 100

## 2020-03-23 MED ORDER — ORAL CARE MOUTH RINSE: 15.0000 mL | Freq: Once | OROMUCOSAL | Status: DC

## 2020-03-23 MED ORDER — PROPOFOL 10 MG/ML IV BOLUS
INTRAVENOUS | Status: AC
  Filled 2020-03-23: qty 20

## 2020-03-23 MED ORDER — SODIUM CHLORIDE 0.9 % IV SOLN: INTRAVENOUS | Status: DC | PRN

## 2020-03-23 MED ORDER — LIDOCAINE-EPINEPHRINE (PF) 1 %-1:200000 IJ SOLN
INTRAMUSCULAR | Status: AC
  Filled 2020-03-23: qty 30

## 2020-03-23 MED ORDER — FENTANYL CITRATE (PF) 100 MCG/2ML IJ SOLN
INTRAMUSCULAR | Status: DC | PRN
  Administered 2020-03-23: 50 ug via INTRAVENOUS
  Administered 2020-03-23: 25 ug via INTRAVENOUS

## 2020-03-23 MED ORDER — ONDANSETRON HCL 4 MG/2ML IJ SOLN
INTRAMUSCULAR | Status: DC | PRN
  Administered 2020-03-23: 4 mg via INTRAVENOUS

## 2020-03-23 MED ORDER — PHENYLEPHRINE 40 MCG/ML (10ML) SYRINGE FOR IV PUSH (FOR BLOOD PRESSURE SUPPORT)
PREFILLED_SYRINGE | INTRAVENOUS | Status: AC
  Filled 2020-03-23: qty 10

## 2020-03-23 MED ORDER — MIDAZOLAM HCL 2 MG/2ML IJ SOLN
INTRAMUSCULAR | Status: AC
  Filled 2020-03-23: qty 2

## 2020-03-23 MED ORDER — ACETAMINOPHEN 160 MG/5ML PO SOLN: 325.0000 mg | Freq: Once | ORAL | Status: DC | PRN

## 2020-03-23 MED ORDER — CHLORHEXIDINE GLUCONATE 0.12 % MT SOLN: 15.0000 mL | Freq: Once | OROMUCOSAL | Status: DC

## 2020-03-23 MED ORDER — ACETAMINOPHEN 10 MG/ML IV SOLN: 1000.0000 mg | Freq: Once | INTRAVENOUS | Status: DC | PRN

## 2020-03-23 MED ORDER — ONDANSETRON HCL 4 MG/2ML IJ SOLN
INTRAMUSCULAR | Status: AC
  Filled 2020-03-23: qty 2

## 2020-03-23 MED ORDER — ACETAMINOPHEN 325 MG PO TABS: 325.0000 mg | ORAL_TABLET | Freq: Once | ORAL | Status: DC | PRN

## 2020-03-23 MED ORDER — PROPOFOL 500 MG/50ML IV EMUL
INTRAVENOUS | Status: DC | PRN
  Administered 2020-03-23: 100 ug/kg/min via INTRAVENOUS

## 2020-03-23 MED ORDER — LIDOCAINE 2% (20 MG/ML) 5 ML SYRINGE
INTRAMUSCULAR | Status: DC | PRN
  Administered 2020-03-23: 60 mg via INTRAVENOUS

## 2020-03-23 SURGICAL SUPPLY — 28 items
ARMBAND PINK RESTRICT EXTREMIT (MISCELLANEOUS) ×3 IMPLANT
CANISTER SUCT 3000ML PPV (MISCELLANEOUS) ×3 IMPLANT
CLIP VESOCCLUDE MED 6/CT (CLIP) ×3 IMPLANT
CLIP VESOCCLUDE SM WIDE 6/CT (CLIP) ×5 IMPLANT
COVER PROBE W GEL 5X96 (DRAPES) ×2 IMPLANT
COVER WAND RF STERILE (DRAPES) ×3 IMPLANT
DERMABOND ADVANCED (GAUZE/BANDAGES/DRESSINGS) ×2
DERMABOND ADVANCED .7 DNX12 (GAUZE/BANDAGES/DRESSINGS) ×1 IMPLANT
ELECT REM PT RETURN 9FT ADLT (ELECTROSURGICAL) ×3
ELECTRODE REM PT RTRN 9FT ADLT (ELECTROSURGICAL) ×1 IMPLANT
GLOVE BIO SURGEON STRL SZ7.5 (GLOVE) ×3 IMPLANT
GOWN STRL REUS W/ TWL LRG LVL3 (GOWN DISPOSABLE) ×2 IMPLANT
GOWN STRL REUS W/ TWL XL LVL3 (GOWN DISPOSABLE) ×1 IMPLANT
GOWN STRL REUS W/TWL LRG LVL3 (GOWN DISPOSABLE) ×4
GOWN STRL REUS W/TWL XL LVL3 (GOWN DISPOSABLE) ×2
INSERT FOGARTY SM (MISCELLANEOUS) IMPLANT
KIT BASIN OR (CUSTOM PROCEDURE TRAY) ×3 IMPLANT
KIT TURNOVER KIT B (KITS) ×3 IMPLANT
NS IRRIG 1000ML POUR BTL (IV SOLUTION) ×3 IMPLANT
PACK CV ACCESS (CUSTOM PROCEDURE TRAY) ×3 IMPLANT
PAD ARMBOARD 7.5X6 YLW CONV (MISCELLANEOUS) ×6 IMPLANT
SUT MNCRL AB 4-0 PS2 18 (SUTURE) ×3 IMPLANT
SUT PROLENE 6 0 BV (SUTURE) ×3 IMPLANT
SUT VIC AB 3-0 SH 27 (SUTURE) ×2
SUT VIC AB 3-0 SH 27X BRD (SUTURE) ×1 IMPLANT
TOWEL GREEN STERILE (TOWEL DISPOSABLE) ×3 IMPLANT
UNDERPAD 30X36 HEAVY ABSORB (UNDERPADS AND DIAPERS) ×3 IMPLANT
WATER STERILE IRR 1000ML POUR (IV SOLUTION) ×3 IMPLANT

## 2020-03-23 NOTE — Discharge Instructions (Signed)
   [facility]  Discharge Instructions  AV Fistula or Graft Surgery for Dialysis Access  Please refer to the following instructions for your post-procedure care. Your surgeon or physician assistant will discuss any changes with you.  Activity  You may drive the day following your surgery, if you are comfortable and no longer taking prescription pain medication. Resume full activity as the soreness in your incision resolves.  Bathing/Showering  You may shower after you go home. Keep your incision dry for 48 hours. Do not soak in a bathtub, hot tub, or swim until the incision heals completely. You may not shower if you have a hemodialysis catheter.  Incision Care  Clean your incision with mild soap and water after 48 hours. Pat the area dry with a clean towel. You do not need a bandage unless otherwise instructed. Do not apply any ointments or creams to your incision. You may have skin glue on your incision. Do not peel it off. It will come off on its own in about one week. Your arm may swell a bit after surgery. To reduce swelling use pillows to elevate your arm so it is above your heart. Your doctor will tell you if you need to lightly wrap your arm with an ACE bandage.  Diet  Resume your normal diet. There are not special food restrictions following this procedure. In order to heal from your surgery, it is CRITICAL to get adequate nutrition. Your body requires vitamins, minerals, and protein. Vegetables are the best source of vitamins and minerals. Vegetables also provide the perfect balance of protein. Processed food has little nutritional value, so try to avoid this.  Medications  Resume taking all of your medications. If your incision is causing pain, you may take over-the counter pain relievers such as acetaminophen (Tylenol). If you were prescribed a stronger pain medication, please be aware these medications can cause nausea and constipation. Prevent  nausea by taking the medication with a snack or meal. Avoid constipation by drinking plenty of fluids and eating foods with high amount of fiber, such as fruits, vegetables, and grains.  Do not take Tylenol if you are taking prescription pain medications.  Follow up Your surgeon may want to see you in the office following your access surgery. If so, this will be arranged at the time of your surgery.  Please call us immediately for any of the following conditions:  . Increased pain, redness, drainage (pus) from your incision site . Fever of 101 degrees or higher . Severe or worsening pain at your incision site . Hand pain or numbness. .  Reduce your risk of vascular disease:  . Stop smoking. If you would like help, call [facility] at 1-800-QUIT-NOW (906)215-2792) or [place] at (743)353-0050  . Manage your cholesterol . Maintain a desired weight . Control your diabetes . Keep your blood pressure down  Dialysis  It will take several weeks to several months for your new dialysis access to be ready for use. Your surgeon will determine when it is okay to use it. Your nephrologist will continue to direct your dialysis. You can continue to use your Permcath until your new access is ready for use.   03/23/2020 Logan Decker [id number] 1961/01/09  Surgeon(s): Waynetta Sandy, MD  Procedure(s): Creation left brachial cephalic AV fistula  x Do not stick fistula for 12 weeks    If you have any questions, please call the office at [phone].

## 2020-03-23 NOTE — Anesthesia Postprocedure Evaluation (Signed)
Anesthesia Post Note  Patient: Logan Decker  Procedure(s) Performed: LEFT ARM ARTERIOVENOUS (AV) FISTULA CREATION (Left Arm Upper)     Patient location during evaluation: PACU Anesthesia Type: MAC Level of consciousness: awake and alert Pain management: pain level controlled Vital Signs Assessment: post-procedure vital signs reviewed and stable Respiratory status: spontaneous breathing, nonlabored ventilation, respiratory function stable and patient connected to nasal cannula oxygen Cardiovascular status: stable and blood pressure returned to baseline Postop Assessment: no apparent nausea or vomiting Anesthetic complications: no    Last Vitals:  Vitals:   03/23/20 0845 03/23/20 0853  BP:  118/83  Pulse: 67 66  Resp: (!) 21 18  Temp:  36.9 C  SpO2: 92% 93%    Last Pain:  Vitals:   03/23/20 0853  PainSc: 0-No pain                 Effie Berkshire

## 2020-03-23 NOTE — Transfer of Care (Signed)
Immediate Anesthesia Transfer of Care Note  Patient: Logan Decker  Procedure(s) Performed: LEFT ARM ARTERIOVENOUS (AV) FISTULA CREATION (Left Arm Upper)  Patient Location: PACU  Anesthesia Type:MAC  Level of Consciousness: awake and oriented  Airway & Oxygen Therapy: Patient Spontanous Breathing and Patient connected to face mask oxygen  Post-op Assessment: Report given to RN  Post vital signs: Reviewed and stable  Last Vitals:  Vitals Value Taken Time  BP    Temp    Pulse 73 03/23/20 0838  Resp 24 03/23/20 0838  SpO2 94 % 03/23/20 0838  Vitals shown include unvalidated device data.  Last Pain:  Vitals:   03/23/20 0701  PainSc: 0-No pain      Patients Stated Pain Goal: (pt. wouldn't give a number) (99/83/38 2505)  Complications: No apparent anesthesia complications

## 2020-03-23 NOTE — Anesthesia Procedure Notes (Signed)
Procedure Name: MAC Date/Time: 03/23/2020 7:40 AM Performed by: Barrington Ellison, CRNA Pre-anesthesia Checklist: Patient identified, Emergency Drugs available, Suction available and Patient being monitored Patient Re-evaluated:Patient Re-evaluated prior to induction Oxygen Delivery Method: Simple face mask

## 2020-03-23 NOTE — Op Note (Signed)
    Patient name: Logan Decker MRN: [id number] DOB: 08-12-1961 Sex: male  03/23/2020 Pre-operative Diagnosis: Chronic kidney disease stage V Post-operative diagnosis:  Same Surgeon:  Eda Paschal. Donzetta Matters, MD Assistant: Leontine Locket, PA Procedure Performed:   Left arm brachial artery to cephalic vein AV fistula creation  Indications: 59 year old male with chronic kidney disease.  He is now indicated for permanent dialysis access.  He is right-hand dominant.  Findings: By intraoperative ultrasound cephalic vein at the wrist was quite diminutive.  At the antecubital was larger was easily dilated 4 mm.  It was relatively thin-walled.  At completion there was a good thrill in the vein confirmed with Doppler and a radial artery signal at the wrist but did not augment with compression of the vein.   This fistula will likely need elevation versus transposition in the future.   Procedure:  The patient was identified in the holding area and taken to the operating room where initially supine on the operative table MAC anesthesia induced.  He was sterilely prepped and draped in the left upper extremity in the usual fashion antibiotics were minister timeout was called.  We used ultrasound identified what appeared to be cephalic vein that was suitable for fistula creation at the antecubital.  The area was anesthetized 1% lidocaine.  Transverse incision was made.  We dissected out the vein.  Branches were divided between clips and ties.  We transected the vein distally marked for orientation spatulated and dilated up to 4 mm serially.  It was flushed heparinized and clamped.  We dissected through the deep fascia identified the brachial artery placed a vessel loop around this.  It was clamped distally proximally opened longitudinally flushed heparinized saline both directions.  The vein was then sewn into side with 6-0 Prolene suture.  Prior to completion low flushing all directions.  Upon completion there was a good  thrill in the vein confirmed with Doppler.  We had good signal at the radial artery at the wrist.  This did not augment with compression of the vein.  Satisfied we irrigated obtain hemostasis and closed in layers with Vicryl and Monocryl.  He was awakened from anesthesia having tolerated procedure without immediate complication.  All counts were correct at completion.  EBL: 25 cc.     Karle Desrosier C. Donzetta Matters, MD [facility] [place] Office: [phone] Pager: [phone]

## 2020-03-23 NOTE — H&P (Signed)
   History and Physical Update  The patient was interviewed and re-examined.  The patient's previous History and Physical has been reviewed and is unchanged from recent office visit. Plan is for left arm avf creation. We have discussed risks and benefits and all questions were answered.   Delene Morais C. Donzetta Matters, MD [facility] [place] Office: [phone] Pager: [phone]   03/23/2020, 7:14 AM

## 2020-04-27 ENCOUNTER — Other Ambulatory Visit: Payer: Self-pay | Admitting: *Deleted

## 2020-04-27 DIAGNOSIS — N186 End stage renal disease: Secondary | ICD-10-CM

## 2020-05-05 ENCOUNTER — Ambulatory Visit (HOSPITAL_COMMUNITY)
Admission: RE | Admit: 2020-05-05 | Discharge: 2020-05-05 | Disposition: A | Discharge status: Home / Self Care | Payer: Medicaid Other | Source: Ambulatory Visit | Attending: Vascular Surgery | Admitting: Vascular Surgery

## 2020-05-05 ENCOUNTER — Other Ambulatory Visit: Payer: Self-pay

## 2020-05-05 ENCOUNTER — Ambulatory Visit (INDEPENDENT_AMBULATORY_CARE_PROVIDER_SITE_OTHER): Payer: Medicaid Other | Admitting: Physician Assistant

## 2020-05-05 VITALS — BP 124/82 | HR 69 | Temp 97.8°F | Resp 20 | Ht 66.0 in | Wt 227.3 lb

## 2020-05-05 DIAGNOSIS — N186 End stage renal disease: Secondary | ICD-10-CM

## 2020-05-05 DIAGNOSIS — N185 Chronic kidney disease, stage 5: Secondary | ICD-10-CM | POA: Diagnosis not present

## 2020-05-05 NOTE — Progress Notes (Signed)
POST OPERATIVE DIALYSIS ACCESS OFFICE NOTE    CC:  F/u for dialysis access surgery; 6 weeks post-op  HPI:  This is a 59 y.o. male who is s/p left upper extremity brachial to cephalic vein AV fistula creation on March 23, 2020 by Dr. Donzetta Matters.  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  He is currently not requiring hemodialysis.  He denies left hand pain, numbness, fever or chills  Nephrologist: Graylon Gunning, MD  Allergies  Allergen Reactions  . Ace Inhibitors Swelling     *Lotension   . Zonisamide Rash    Current Outpatient Medications  Medication Sig Dispense Refill  . allopurinol (ZYLOPRIM) 100 MG tablet Take 100 mg by mouth 2 (two) times daily.    [name] amLODipine (NORVASC) 10 MG tablet Take 10 mg by mouth daily.    [name] atenolol (TENORMIN) 100 MG tablet Take 100 mg by mouth daily.    [name] atorvastatin (LIPITOR) 40 MG tablet Take 40 mg by mouth daily.     . calcitRIOL (ROCALTROL) 0.25 MCG capsule Take 0.25 mcg by mouth daily.    . Cholecalciferol (VITAMIN D-3) 125 MCG (5000 UT) TABS Take 5,000 mg by mouth daily.    . ferrous sulfate 325 (65 FE) MG tablet Take 325 mg by mouth daily with breakfast.    . furosemide (LASIX) 40 MG tablet Take 40 mg by mouth 2 (two) times daily.     [name] levETIRAcetam (KEPPRA) 500 MG tablet Take 500 mg by mouth 2 (two) times daily.    [name] oxyCODONE-acetaminophen (PERCOCET) 5-325 MG tablet Take 1 tablet by mouth every 6 (six) hours as needed. 8 tablet 0   No current facility-administered medications for this visit.     ROS:  See HPI  Vitals:   05/05/20 1137  BP: 124/82  Pulse: 69  Resp: 20  Temp: 97.8 F (36.6 C)  SpO2: 97%   Physical Exam:  General appearance: Well-developed, well-nourished male in no apparent distress Cardiac: Heart rate and rhythm are regular Respiratory: Nonlabored Incision: left AC incision healing without signs of infection Extremities: LUE:  Good bruit and thrill in fistula.  5 out of 5 hand grip strength.  Sensation intact.  2+ radial and ulnar pulses.  Unable to palpate fistula in the upper arm.  Dialysis duplex on 05/05/2020 Findings:  +--------------------+----------+-----------------+--------+  AVF         PSV (cm/s)Flow Vol (mL/min)Comments  +--------------------+----------+-----------------+--------+  Native artery inflow  177     1597          +--------------------+----------+-----------------+--------+  AVF Anastomosis     533                 +--------------------+----------+-----------------+--------+     +------------+----------+-------------+----------+-------------------------  ----+  OUTFLOW VEINPSV (cm/s)Diameter (cm)Depth (cm)     Describe        +------------+----------+-------------+----------+-------------------------  ----+  Prox UA     121    0.57     0.61                   +------------+----------+-------------+----------+-------------------------  ----+  Mid UA     177    0.65     0.55                   +------------+----------+-------------+----------+-------------------------  ----+  Dist UA     500    0.98     0.39  competing branch and  Retained                               valve         +------------+----------+-------------+----------+-------------------------  ----+  AC Fossa    184    1.25     0.27                   +------------+----------+-------------+----------+-------------------------  ----+     Summary:  Arteriovenous fistula-Elevated velocities noted in the Elevated velocities  at distal upper arm with retained valve and competing branch. , which may be  due to a retained valve.   Assessment/Plan:   -pt does not have evidence of steal  syndrome -dialysis duplex today reveals fistula to be of adequate diameter but inadequate depth for access.  It is not palpable. -explained the need superficialization.  We will set this up with Dr. Donzetta Matters in the few weeks. The patient is in agreement with this plan 05/05/2020 11:31 AM [facility] [phone]  Clinic MD: Donzetta Matters

## 2020-05-05 NOTE — H&P (View-Only) (Signed)
POST OPERATIVE DIALYSIS ACCESS OFFICE NOTE    CC:  F/u for dialysis access surgery; 6 weeks post-op  HPI:  This is a 59 y.o. male who is s/p left upper extremity brachial to cephalic vein AV fistula creation on March 23, 2020 by Dr. Donzetta Matters.  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  He is currently not requiring hemodialysis.  He denies left hand pain, numbness, fever or chills  Nephrologist: Graylon Gunning, MD  Allergies  Allergen Reactions  . Ace Inhibitors Swelling     *Lotension   . Zonisamide Rash    Current Outpatient Medications  Medication Sig Dispense Refill  . allopurinol (ZYLOPRIM) 100 MG tablet Take 100 mg by mouth 2 (two) times daily.    [name] amLODipine (NORVASC) 10 MG tablet Take 10 mg by mouth daily.    [name] atenolol (TENORMIN) 100 MG tablet Take 100 mg by mouth daily.    [name] atorvastatin (LIPITOR) 40 MG tablet Take 40 mg by mouth daily.     . calcitRIOL (ROCALTROL) 0.25 MCG capsule Take 0.25 mcg by mouth daily.    . Cholecalciferol (VITAMIN D-3) 125 MCG (5000 UT) TABS Take 5,000 mg by mouth daily.    . ferrous sulfate 325 (65 FE) MG tablet Take 325 mg by mouth daily with breakfast.    . furosemide (LASIX) 40 MG tablet Take 40 mg by mouth 2 (two) times daily.     [name] levETIRAcetam (KEPPRA) 500 MG tablet Take 500 mg by mouth 2 (two) times daily.    [name] oxyCODONE-acetaminophen (PERCOCET) 5-325 MG tablet Take 1 tablet by mouth every 6 (six) hours as needed. 8 tablet 0   No current facility-administered medications for this visit.     ROS:  See HPI  Vitals:   05/05/20 1137  BP: 124/82  Pulse: 69  Resp: 20  Temp: 97.8 F (36.6 C)  SpO2: 97%   Physical Exam:  General appearance: Well-developed, well-nourished male in no apparent distress Cardiac: Heart rate and rhythm are regular Respiratory: Nonlabored Incision: left AC incision healing without signs of infection Extremities: LUE:  Good bruit and thrill in fistula.  5 out of 5 hand grip strength.  Sensation intact.  2+ radial and ulnar pulses.  Unable to palpate fistula in the upper arm.  Dialysis duplex on 05/05/2020 Findings:  +--------------------+----------+-----------------+--------+  AVF         PSV (cm/s)Flow Vol (mL/min)Comments  +--------------------+----------+-----------------+--------+  Native artery inflow  177     1597          +--------------------+----------+-----------------+--------+  AVF Anastomosis     533                 +--------------------+----------+-----------------+--------+     +------------+----------+-------------+----------+-------------------------  ----+  OUTFLOW VEINPSV (cm/s)Diameter (cm)Depth (cm)     Describe        +------------+----------+-------------+----------+-------------------------  ----+  Prox UA     121    0.57     0.61                   +------------+----------+-------------+----------+-------------------------  ----+  Mid UA     177    0.65     0.55                   +------------+----------+-------------+----------+-------------------------  ----+  Dist UA     500    0.98     0.39  competing branch and  Retained                               valve         +------------+----------+-------------+----------+-------------------------  ----+  AC Fossa    184    1.25     0.27                   +------------+----------+-------------+----------+-------------------------  ----+     Summary:  Arteriovenous fistula-Elevated velocities noted in the Elevated velocities  at distal upper arm with retained valve and competing branch. , which may be  due to a retained valve.   Assessment/Plan:   -pt does not have evidence of steal  syndrome -dialysis duplex today reveals fistula to be of adequate diameter but inadequate depth for access.  It is not palpable. -explained the need superficialization.  We will set this up with Dr. Donzetta Matters in the few weeks. The patient is in agreement with this plan 05/05/2020 11:31 AM [facility] [phone]  Clinic MD: Donzetta Matters

## 2020-05-09 ENCOUNTER — Other Ambulatory Visit: Payer: Self-pay

## 2020-05-12 ENCOUNTER — Other Ambulatory Visit (HOSPITAL_COMMUNITY): Payer: Medicaid Other

## 2020-05-26 ENCOUNTER — Other Ambulatory Visit: Payer: Self-pay

## 2020-05-26 ENCOUNTER — Encounter (HOSPITAL_COMMUNITY): Payer: Self-pay | Admitting: Vascular Surgery

## 2020-05-26 NOTE — Progress Notes (Signed)
Pt denies SOB, chest pain, and being under the care of a cardiologist.  PCP - Tami Ribas, PA-C Neurology - Dr Barnett Hatter  Chest x-ray - denies EKG- 03/23/20 Stress Test -denies Echo-denies Cardiac Cath - denies   Pt made aware to stop taking vitamins, fish oil and herbal medications. Do not take any NSAIDs ie: Ibuprofen, Advil, Naproxen (Aleve), Motrin, BC and Goody Powder. Pt reminded to quarantine.  Pt verbalized understanding of all pre-op instructions.

## 2020-05-29 ENCOUNTER — Other Ambulatory Visit (HOSPITAL_COMMUNITY)
Admission: RE | Admit: 2020-05-29 | Discharge: 2020-05-29 | Disposition: A | Discharge status: Home / Self Care | Payer: Medicaid Other | Source: Ambulatory Visit | Attending: Vascular Surgery | Admitting: Vascular Surgery

## 2020-05-29 DIAGNOSIS — Z20822 Contact with and (suspected) exposure to covid-19: Secondary | ICD-10-CM | POA: Insufficient documentation

## 2020-05-29 DIAGNOSIS — Z01812 Encounter for preprocedural laboratory examination: Secondary | ICD-10-CM | POA: Diagnosis not present

## 2020-05-29 LAB — SARS CORONAVIRUS 2 (TAT 6-24 HRS): SARS Coronavirus 2: NEGATIVE

## 2020-05-30 ENCOUNTER — Encounter (HOSPITAL_COMMUNITY): Payer: Self-pay | Admitting: Vascular Surgery

## 2020-05-30 ENCOUNTER — Encounter (HOSPITAL_COMMUNITY)
Admission: RE | Disposition: A | Discharge status: Home / Self Care | Payer: Self-pay | Source: Home / Self Care | Attending: Vascular Surgery

## 2020-05-30 ENCOUNTER — Ambulatory Visit (HOSPITAL_COMMUNITY): Payer: Medicaid Other | Admitting: Certified Registered"

## 2020-05-30 ENCOUNTER — Other Ambulatory Visit: Payer: Self-pay

## 2020-05-30 ENCOUNTER — Ambulatory Visit (HOSPITAL_COMMUNITY)
Admission: RE | Admit: 2020-05-30 | Discharge: 2020-05-30 | Disposition: A | Discharge status: Home / Self Care | Payer: Medicaid Other | Attending: Vascular Surgery | Admitting: Vascular Surgery

## 2020-05-30 DIAGNOSIS — N185 Chronic kidney disease, stage 5: Secondary | ICD-10-CM | POA: Insufficient documentation

## 2020-05-30 DIAGNOSIS — Q613 Polycystic kidney, unspecified: Secondary | ICD-10-CM | POA: Diagnosis not present

## 2020-05-30 DIAGNOSIS — T82510A Breakdown (mechanical) of surgically created arteriovenous fistula, initial encounter: Secondary | ICD-10-CM | POA: Diagnosis present

## 2020-05-30 DIAGNOSIS — Y839 Surgical procedure, unspecified as the cause of abnormal reaction of the patient, or of later complication, without mention of misadventure at the time of the procedure: Secondary | ICD-10-CM | POA: Diagnosis not present

## 2020-05-30 DIAGNOSIS — Z79899 Other long term (current) drug therapy: Secondary | ICD-10-CM | POA: Insufficient documentation

## 2020-05-30 DIAGNOSIS — Z85528 Personal history of other malignant neoplasm of kidney: Secondary | ICD-10-CM | POA: Insufficient documentation

## 2020-05-30 HISTORY — DX: Presence of dental prosthetic device (complete) (partial): Z97.2

## 2020-05-30 HISTORY — PX: FISTULA SUPERFICIALIZATION: SHX6341

## 2020-05-30 LAB — POCT I-STAT, CHEM 8
BUN: 61 mg/dL — ABNORMAL HIGH (ref 6–20)
Calcium, Ion: 1.08 mmol/L — ABNORMAL LOW (ref 1.15–1.40)
Chloride: 105 mmol/L (ref 98–111)
Creatinine, Ser: 5.6 mg/dL — ABNORMAL HIGH (ref 0.61–1.24)
Glucose, Bld: 97 mg/dL (ref 70–99)
HCT: 42 % (ref 39.0–52.0)
Hemoglobin: 14.3 g/dL (ref 13.0–17.0)
Potassium: 4.2 mmol/L (ref 3.5–5.1)
Sodium: 139 mmol/L (ref 135–145)
TCO2: 24 mmol/L (ref 22–32)

## 2020-05-30 SURGERY — FISTULA SUPERFICIALIZATION
Anesthesia: Monitor Anesthesia Care | Site: Arm Upper | Laterality: Left

## 2020-05-30 MED ORDER — 0.9 % SODIUM CHLORIDE (POUR BTL) OPTIME
TOPICAL | Status: DC | PRN
  Administered 2020-05-30: 1000 mL

## 2020-05-30 MED ORDER — LIDOCAINE HCL 1 % IJ SOLN
INTRAMUSCULAR | Status: DC | PRN
Start: 2020-05-30 — End: 2020-05-30
  Administered 2020-05-30: 100 mg via INTRADERMAL

## 2020-05-30 MED ORDER — FENTANYL CITRATE (PF) 100 MCG/2ML IJ SOLN: 25.0000 ug | INTRAMUSCULAR | Status: DC | PRN

## 2020-05-30 MED ORDER — SODIUM CHLORIDE 0.9 % IV SOLN: INTRAVENOUS | Status: DC | PRN

## 2020-05-30 MED ORDER — ONDANSETRON HCL 4 MG/2ML IJ SOLN: 4.0000 mg | Freq: Once | INTRAMUSCULAR | Status: DC | PRN

## 2020-05-30 MED ORDER — MIDAZOLAM HCL 2 MG/2ML IJ SOLN
INTRAMUSCULAR | Status: DC | PRN
  Administered 2020-05-30: 2 mg via INTRAVENOUS

## 2020-05-30 MED ORDER — LIDOCAINE-EPINEPHRINE (PF) 1 %-1:200000 IJ SOLN
INTRAMUSCULAR | Status: DC | PRN
  Administered 2020-05-30: 16 mL

## 2020-05-30 MED ORDER — CHLORHEXIDINE GLUCONATE 4 % EX LIQD: 60.0000 mL | Freq: Once | CUTANEOUS | Status: DC

## 2020-05-30 MED ORDER — OXYCODONE HCL 5 MG/5ML PO SOLN: 5.0000 mg | Freq: Once | ORAL | Status: DC | PRN

## 2020-05-30 MED ORDER — PROPOFOL 10 MG/ML IV BOLUS
INTRAVENOUS | Status: AC
  Filled 2020-05-30: qty 20

## 2020-05-30 MED ORDER — MIDAZOLAM HCL 2 MG/2ML IJ SOLN
INTRAMUSCULAR | Status: AC
  Filled 2020-05-30: qty 2

## 2020-05-30 MED ORDER — SODIUM CHLORIDE 0.9 % IV SOLN
INTRAVENOUS | Status: AC
  Filled 2020-05-30: qty 1.2

## 2020-05-30 MED ORDER — PROPOFOL 500 MG/50ML IV EMUL
INTRAVENOUS | Status: DC | PRN
Start: 2020-05-30 — End: 2020-05-30
  Administered 2020-05-30: 100 ug/kg/min via INTRAVENOUS

## 2020-05-30 MED ORDER — LIDOCAINE HCL (PF) 1 % IJ SOLN
INTRAMUSCULAR | Status: AC
  Filled 2020-05-30: qty 30

## 2020-05-30 MED ORDER — LIDOCAINE 2% (20 MG/ML) 5 ML SYRINGE
INTRAMUSCULAR | Status: AC
  Filled 2020-05-30: qty 5

## 2020-05-30 MED ORDER — LIDOCAINE-EPINEPHRINE (PF) 1 %-1:200000 IJ SOLN
INTRAMUSCULAR | Status: AC
  Filled 2020-05-30: qty 30

## 2020-05-30 MED ORDER — CHLORHEXIDINE GLUCONATE 0.12 % MT SOLN
15.0000 mL | Freq: Once | OROMUCOSAL | Status: AC
  Administered 2020-05-30: 15 mL via OROMUCOSAL
  Filled 2020-05-30: qty 15

## 2020-05-30 MED ORDER — OXYCODONE-ACETAMINOPHEN 5-325 MG PO TABS: 1.0000 | ORAL_TABLET | Freq: Four times a day (QID) | ORAL | 0 refills | Status: DC | PRN

## 2020-05-30 MED ORDER — OXYCODONE HCL 5 MG PO TABS: 5.0000 mg | ORAL_TABLET | Freq: Once | ORAL | Status: DC | PRN

## 2020-05-30 MED ORDER — ORAL CARE MOUTH RINSE: 15.0000 mL | Freq: Once | OROMUCOSAL | Status: AC

## 2020-05-30 MED ORDER — CEFAZOLIN SODIUM-DEXTROSE 2-4 GM/100ML-% IV SOLN
2.0000 g | INTRAVENOUS | Status: AC
  Administered 2020-05-30: 2 g via INTRAVENOUS
  Filled 2020-05-30: qty 100

## 2020-05-30 MED ORDER — LACTATED RINGERS IV SOLN: INTRAVENOUS | Status: DC

## 2020-05-30 MED ORDER — SODIUM CHLORIDE 0.9 % IV SOLN: INTRAVENOUS | Status: DC

## 2020-05-30 MED ORDER — SODIUM CHLORIDE 0.9 % IV SOLN
INTRAVENOUS | Status: DC | PRN
  Administered 2020-05-30: 07:00:00 500 mL

## 2020-05-30 SURGICAL SUPPLY — 34 items
ARMBAND PINK RESTRICT EXTREMIT (MISCELLANEOUS) ×3 IMPLANT
CANISTER SUCT 3000ML PPV (MISCELLANEOUS) ×3 IMPLANT
CLIP VESOCCLUDE MED 6/CT (CLIP) ×3 IMPLANT
CLIP VESOCCLUDE SM WIDE 6/CT (CLIP) ×6 IMPLANT
COVER PROBE W GEL 5X96 (DRAPES) ×3 IMPLANT
COVER WAND RF STERILE (DRAPES) ×3 IMPLANT
DERMABOND ADVANCED (GAUZE/BANDAGES/DRESSINGS) ×2
DERMABOND ADVANCED .7 DNX12 (GAUZE/BANDAGES/DRESSINGS) ×1 IMPLANT
ELECT REM PT RETURN 9FT ADLT (ELECTROSURGICAL) ×3
ELECTRODE REM PT RTRN 9FT ADLT (ELECTROSURGICAL) ×1 IMPLANT
GLOVE BIO SURGEON STRL SZ 6.5 (GLOVE) ×2 IMPLANT
GLOVE BIO SURGEON STRL SZ7 (GLOVE) ×3 IMPLANT
GLOVE BIO SURGEON STRL SZ7.5 (GLOVE) ×3 IMPLANT
GLOVE BIO SURGEONS STRL SZ 6.5 (GLOVE) ×1
GLOVE BIOGEL PI IND STRL 6.5 (GLOVE) ×2 IMPLANT
GLOVE BIOGEL PI IND STRL 7.0 (GLOVE) ×1 IMPLANT
GLOVE BIOGEL PI INDICATOR 6.5 (GLOVE) ×4
GLOVE BIOGEL PI INDICATOR 7.0 (GLOVE) ×2
GOWN STRL REUS W/ TWL LRG LVL3 (GOWN DISPOSABLE) ×2 IMPLANT
GOWN STRL REUS W/ TWL XL LVL3 (GOWN DISPOSABLE) ×1 IMPLANT
GOWN STRL REUS W/TWL LRG LVL3 (GOWN DISPOSABLE) ×4
GOWN STRL REUS W/TWL XL LVL3 (GOWN DISPOSABLE) ×2
KIT BASIN OR (CUSTOM PROCEDURE TRAY) ×3 IMPLANT
KIT TURNOVER KIT B (KITS) ×3 IMPLANT
NS IRRIG 1000ML POUR BTL (IV SOLUTION) ×3 IMPLANT
PACK CV ACCESS (CUSTOM PROCEDURE TRAY) ×3 IMPLANT
PAD ARMBOARD 7.5X6 YLW CONV (MISCELLANEOUS) ×6 IMPLANT
SUT MNCRL AB 4-0 PS2 18 (SUTURE) ×6 IMPLANT
SUT PROLENE 6 0 BV (SUTURE) ×9 IMPLANT
SUT VIC AB 3-0 SH 27 (SUTURE) ×2
SUT VIC AB 3-0 SH 27X BRD (SUTURE) ×1 IMPLANT
TOWEL GREEN STERILE (TOWEL DISPOSABLE) ×3 IMPLANT
UNDERPAD 30X36 HEAVY ABSORB (UNDERPADS AND DIAPERS) ×3 IMPLANT
WATER STERILE IRR 1000ML POUR (IV SOLUTION) ×3 IMPLANT

## 2020-05-30 NOTE — Discharge Instructions (Signed)
° °  [facility] [place] ° °Discharge Instructions ° °AV Fistula or Graft Surgery for Dialysis Access ° °Please refer to the following instructions for your post-procedure care. Your surgeon or physician assistant will discuss any changes with you. ° °Activity ° °You may drive the day following your surgery, if you are comfortable and no longer taking prescription pain medication. Resume full activity as the soreness in your incision resolves. ° °Bathing/Showering ° °You may shower after you go home. Keep your incision dry for 48 hours. Do not soak in a bathtub, hot tub, or swim until the incision heals completely. You may not shower if you have a hemodialysis catheter. ° °Incision Care ° °Clean your incision with mild soap and water after 48 hours. Pat the area dry with a clean towel. You do not need a bandage unless otherwise instructed. Do not apply any ointments or creams to your incision. You may have skin glue on your incision. Do not peel it off. It will come off on its own in about one week. Your arm may swell a bit after surgery. To reduce swelling use pillows to elevate your arm so it is above your heart. Your doctor will tell you if you need to lightly wrap your arm with an ACE bandage. ° °Diet ° °Resume your normal diet. There are not special food restrictions following this procedure. In order to heal from your surgery, it is CRITICAL to get adequate nutrition. Your body requires vitamins, minerals, and protein. Vegetables are the best source of vitamins and minerals. Vegetables also provide the perfect balance of protein. Processed food has little nutritional value, so try to avoid this. ° °Medications ° °Resume taking all of your medications. If your incision is causing pain, you may take over-the counter pain relievers such as acetaminophen (Tylenol). If you were prescribed a stronger pain medication, please be aware these medications can cause nausea and constipation. Prevent  nausea by taking the medication with a snack or meal. Avoid constipation by drinking plenty of fluids and eating foods with high amount of fiber, such as fruits, vegetables, and grains. Do not take Tylenol if you are taking prescription pain medications. ° ° ° ° °Follow up °Your surgeon may want to see you in the office following your access surgery. If so, this will be arranged at the time of your surgery. ° °Please call us immediately for any of the following conditions: ° °Increased pain, redness, drainage (pus) from your incision site °Fever of 101 degrees or higher °Severe or worsening pain at your incision site °Hand pain or numbness. ° °Reduce your risk of vascular disease: ° °Stop smoking. If you would like help, call [facility] at 1-800-QUIT-NOW (1-800-784-8669) or [place] at 336-586-4000 ° °Manage your cholesterol °Maintain a desired weight °Control your diabetes °Keep your blood pressure down ° °Dialysis ° °It will take several weeks to several months for your new dialysis access to be ready for use. Your surgeon will determine when it is OK to use it. Your nephrologist will continue to direct your dialysis. You can continue to use your Permcath until your new access is ready for use. ° °If you have any questions, please call the office at [phone]. ° °

## 2020-05-30 NOTE — Transfer of Care (Signed)
Immediate Anesthesia Transfer of Care Note  Patient: Logan Decker  Procedure(s) Performed: LEFT BRACHIOCEPHALIC Transposition of FISTULA . (Left Arm Upper)  Patient Location: PACU  Anesthesia Type:MAC  Level of Consciousness: awake, alert , oriented and patient cooperative  Airway & Oxygen Therapy: Patient Spontanous Breathing  Post-op Assessment: Report given to RN, Post -op Vital signs reviewed and stable and Patient moving all extremities X 4  Post vital signs: Reviewed and stable  Last Vitals:  Vitals Value Taken Time  BP    Temp    Pulse 70 05/30/20 0845  Resp 15 05/30/20 0845  SpO2 98 % 05/30/20 0845  Vitals shown include unvalidated device data.  Last Pain:  Vitals:   05/30/20 0634  TempSrc:   PainSc: 0-No pain      Patients Stated Pain Goal: 3 (69/62/95 2841)  Complications: No complications documented.

## 2020-05-30 NOTE — Interval H&P Note (Signed)
History and Physical Interval Note:  05/30/2020 7:14 AM  Logan Decker  has presented today for surgery, with the diagnosis of CHRONIC KIDNEY DISEASE STAGE 5.  The various methods of treatment have been discussed with the patient and family. After consideration of risks, benefits and other options for treatment, the patient has consented to  Procedure(s): LEFT BRACHIOCEPHALIC ARTERIOVENOUS FISTULA SUPERFICIALIZATION (Left) as a surgical intervention.  The patient's history has been reviewed, patient examined, no change in status, stable for surgery.  I have reviewed the patient's chart and labs.  Questions were answered to the patient's satisfaction.     Servando Snare

## 2020-05-30 NOTE — Anesthesia Postprocedure Evaluation (Signed)
Anesthesia Post Note  Patient: Logan Decker  Procedure(s) Performed: LEFT BRACHIOCEPHALIC Transposition of FISTULA . (Left Arm Upper)     Patient location during evaluation: PACU Anesthesia Type: MAC Level of consciousness: awake and alert Pain management: pain level controlled Vital Signs Assessment: post-procedure vital signs reviewed and stable Respiratory status: spontaneous breathing, nonlabored ventilation, respiratory function stable and patient connected to nasal cannula oxygen Cardiovascular status: stable and blood pressure returned to baseline Postop Assessment: no apparent nausea or vomiting Anesthetic complications: no   No complications documented.  Last Vitals:  Vitals:   05/30/20 0623 05/30/20 0845  BP: (!) 141/85   Pulse: 70   Resp: 18   Temp: 36.5 C 36.4 C  SpO2: 100%     Last Pain:  Vitals:   05/30/20 0845  TempSrc:   PainSc: 2                  Paislynn Hegstrom COKER

## 2020-05-30 NOTE — Anesthesia Preprocedure Evaluation (Signed)
Anesthesia Evaluation  Patient identified by MRN, date of birth, ID band Patient awake    Reviewed: Allergy & Precautions, NPO status , Patient's Chart, lab work & pertinent test results  Airway Mallampati: III  TM Distance: >3 FB Neck ROM: Full    Dental  (+) Edentulous Upper, Edentulous Lower   Pulmonary Current Smoker,    breath sounds clear to auscultation       Cardiovascular hypertension,  Rhythm:Regular Rate:Normal     Neuro/Psych    GI/Hepatic   Endo/Other    Renal/GU      Musculoskeletal   Abdominal   Peds  Hematology   Anesthesia Other Findings   Reproductive/Obstetrics                             Anesthesia Physical Anesthesia Plan  ASA: III  Anesthesia Plan: MAC   Post-op Pain Management:    Induction: Intravenous  PONV Risk Score and Plan: Ondansetron and Dexamethasone  Airway Management Planned: Natural Airway and Simple Face Mask  Additional Equipment:   Intra-op Plan:   Post-operative Plan:   Informed Consent: I have reviewed the patients History and Physical, chart, labs and discussed the procedure including the risks, benefits and alternatives for the proposed anesthesia with the patient or authorized representative who has indicated his/her understanding and acceptance.       Plan Discussed with: CRNA and Anesthesiologist  Anesthesia Plan Comments:         Anesthesia Quick Evaluation

## 2020-05-30 NOTE — Op Note (Signed)
    Patient name: Logan Decker MRN: [id number] DOB: [birth year] Sex: male  05/30/2020 Pre-operative Diagnosis: Chronic kidney disease stage V Post-operative diagnosis:  Same Surgeon:  Eda Paschal. Donzetta Matters, MD Assistant: Laurence Slate, PA Procedure Performed:  Revision of left arm cephalic vein AV fistula with transposition  Indications: 59 year old male with chronic kidney disease stage V has previously had a left brachial artery cephalic vein AV fistula created in his antecubital.  This is too deep for access does have 2 large branches and is indicated for revision.  Findings: Fistula had a very large branch just above the antecubitum this was divided.  There were also multiple small branches and one larger branch toward the upper arm that were divided.  The fistula was fully mobilized and transposed just deep to the skin at completion there was good thrill confirmed with Doppler and a palpable radial artery pulse at the wrist also confirmed with Doppler.   Procedure:  The patient was identified in the holding area and taken to the operating where is placed supine on the operating table and MAC anesthesia was induced.  He was sterilely prepped and draped in the left upper extremity in the usual fashion antibiotics were ministered and timeout was called.  Ultrasound was used to identify the cephalic vein fistula as well as the area of branching is were marked on the skin.  The area was anesthetized with 1% lidocaine with epinephrine.  A total of 2 longitudinal incisions were made on the skin.  The fistula was fully dissected out branches were divided between ties.  This was marked for orientation.  We then clamped it near the antecubitum and transected it.  We flushed with heparinized saline clamped and flushed again and tunneled it just deep to the skin.  We spatulated both ends and sewed them end-to-end with 6-0 Prolene suture.  Prior completion low flushing all directions.  Upon completion the fistula had  very good thrill.  We did mobilize some soft tissue in the tunneling tract.  Fistula site nicely in both incisions.  We closed some deep tissue with interrupted 3-0 Vicryl suture in the most cephalad incision.  Skin was then closed over the fistula with 4-0 Monocryl and Dermabond is placed at the level of the skin.  He was awakened from anesthesia having tolerated procedure without any complication.  All counts were correct at completion.  EBL: 25 cc   Logan Decker C. Donzetta Matters, MD [facility] Office: [phone] Pager: [phone]

## 2020-05-31 ENCOUNTER — Encounter (HOSPITAL_COMMUNITY): Payer: Self-pay | Admitting: Vascular Surgery

## 2020-06-23 ENCOUNTER — Other Ambulatory Visit: Payer: Self-pay

## 2020-06-23 ENCOUNTER — Ambulatory Visit (INDEPENDENT_AMBULATORY_CARE_PROVIDER_SITE_OTHER): Payer: Self-pay | Admitting: Physician Assistant

## 2020-06-23 VITALS — BP 140/89 | HR 75 | Temp 98.6°F | Resp 20 | Ht 66.0 in | Wt 224.2 lb

## 2020-06-23 DIAGNOSIS — N185 Chronic kidney disease, stage 5: Secondary | ICD-10-CM

## 2020-06-23 NOTE — Progress Notes (Signed)
°  POST OPERATIVE OFFICE NOTE    CC:  F/u for surgery  HPI:  This is a 59 y.o. male who is s/p left BC fistula transposition on 05/30/20 by Dr. Donzetta Matters.    Pt returns today for follow up.   He denise pain, loss of motor or loss of sensation.  He is not currently on HD.  He is followed by DR. Patel.  Allergies  Allergen Reactions   Ace Inhibitors Swelling    *Lotension    Zonisamide Rash    Current Outpatient Medications  Medication Sig Dispense Refill   allopurinol (ZYLOPRIM) 100 MG tablet Take 100 mg by mouth 2 (two) times daily.     amLODipine (NORVASC) 10 MG tablet Take 10 mg by mouth daily.     atenolol (TENORMIN) 100 MG tablet Take 100 mg by mouth daily.     atorvastatin (LIPITOR) 40 MG tablet Take 40 mg by mouth daily.      calcitRIOL (ROCALTROL) 0.25 MCG capsule Take 0.25 mcg by mouth daily.     Cholecalciferol (VITAMIN D-3) 125 MCG (5000 UT) TABS Take 5,000 mg by mouth daily.     ferrous sulfate 325 (65 FE) MG tablet Take 325 mg by mouth daily with breakfast.     furosemide (LASIX) 40 MG tablet Take 40 mg by mouth 2 (two) times daily.      levETIRAcetam (KEPPRA) 500 MG tablet Take 500 mg by mouth 2 (two) times daily.     mupirocin cream (BACTROBAN) 2 % Apply 1 application topically 2 (two) times daily.     oxyCODONE-acetaminophen (PERCOCET/ROXICET) 5-325 MG tablet Take 1 tablet by mouth every 6 (six) hours as needed. 10 tablet 0   No current facility-administered medications for this visit.     ROS:  See HPI  Physical Exam:    Incision:  Well healed incisions without erythema or openings in the skin Extremities:  Palpable radial pulse and thrill in the fistula throughout its length.  Grip 5/5 equal B, sensation intact. Lungs: non labored breathing   Assessment/Plan:  This is a 59 y.o. male who is s/p: Transposition left BC AV fistula    He is symptomatic for steal and is not yet on HD. He is followed by Dr. Posey Pronto for kidney function needs.  He will  f/u PRN.  I asked him to check the thrill in the fistula at least once a day.  Roxy Horseman PA-C [facility] [phone]  Clinic MD:  Donzetta Matters

## 2020-09-28 ENCOUNTER — Other Ambulatory Visit: Payer: Self-pay | Admitting: Urology

## 2020-09-28 DIAGNOSIS — D49512 Neoplasm of unspecified behavior of left kidney: Secondary | ICD-10-CM

## 2020-09-28 DIAGNOSIS — C641 Malignant neoplasm of right kidney, except renal pelvis: Secondary | ICD-10-CM

## 2020-10-17 ENCOUNTER — Other Ambulatory Visit: Payer: Self-pay

## 2020-10-17 ENCOUNTER — Ambulatory Visit (HOSPITAL_COMMUNITY)
Admission: RE | Admit: 2020-10-17 | Discharge: 2020-10-17 | Disposition: A | Discharge status: Home / Self Care | Payer: Medicaid Other | Source: Ambulatory Visit | Attending: Urology | Admitting: Urology

## 2020-10-17 DIAGNOSIS — D49512 Neoplasm of unspecified behavior of left kidney: Secondary | ICD-10-CM | POA: Insufficient documentation

## 2020-10-17 DIAGNOSIS — C641 Malignant neoplasm of right kidney, except renal pelvis: Secondary | ICD-10-CM | POA: Diagnosis present

## 2020-10-31 ENCOUNTER — Other Ambulatory Visit: Payer: Self-pay | Admitting: Urology

## 2020-11-08 NOTE — Patient Instructions (Addendum)
DUE TO COVID-19 ONLY ONE VISITOR IS ALLOWED TO COME WITH YOU AND STAY IN THE WAITING ROOM ONLY DURING PRE OP AND PROCEDURE DAY OF SURGERY. THE 1 VISITOR  MAY VISIT WITH YOU AFTER SURGERY IN YOUR PRIVATE ROOM DURING VISITING HOURS ONLY!  YOU NEED TO HAVE A COVID 19 TEST ON_1/20______ @2 :45_______, THIS TEST MUST BE DONE BEFORE SURGERY,  COVID TESTING SITE [place] [id number], IT IS ON THE RIGHT GOING OUT [address] APPROXIMATELY  2 MINUTES PAST [employer] ON THE RIGHT. ONCE YOUR COVID TEST IS COMPLETED,  PLEASE BEGIN THE QUARANTINE INSTRUCTIONS AS OUTLINED IN YOUR HANDOUT.                Logan Decker    Your procedure is scheduled on: 11/10/20   Report to [facility] Main  Entrance   Report to admitting at  12:00 PM      Call this number if you have problems the morning of surgery [phone]    Remember: Do not eat food after Midnight  .You may have clear liquids until 6:00 AM  . BRUSH YOUR TEETH MORNING OF SURGERY AND RINSE YOUR MOUTH OUT, NO CHEWING GUM CANDY OR MINTS.     Take these medicines the morning of surgery with A SIP OF WATER: Keppra, Atenolol, Amlodipine, Allopurinol                                You may not have any metal on your body including              piercings  Do not wear jewelry, lotions, powders or deodorant          .              Men may shave face and neck.   Do not bring valuables to the hospital. [place].  Contacts, dentures or bridgework may not be worn into surgery.      _____________________________________________________________________             [facility] - Preparing for Surgery Before surgery, you can play an important role.  Because skin is not sterile, your skin needs to be as free of germs as possible.  You can reduce the number of germs on your skin by washing with CHG (chlorahexidine gluconate) soap before surgery.  CHG is an  antiseptic cleaner which kills germs and bonds with the skin to continue killing germs even after washing. Please DO NOT use if you have an allergy to CHG or antibacterial soaps.  If your skin becomes reddened/irritated stop using the CHG and inform your nurse when you arrive at Short Stay.   You may shave your face/neck. Please follow these instructions carefully:  1.  Shower with CHG Soap the night before surgery and the  morning of Surgery.  2.  If you choose to wash your hair, wash your hair first as usual with your  normal  shampoo.  3.  After you shampoo, rinse your hair and body thoroughly to remove the  shampoo.                                        4.  Use  CHG as you would any other liquid soap.  You can apply chg directly  to the skin and wash                       Gently with a scrungie or clean washcloth.  5.  Apply the CHG Soap to your body ONLY FROM THE NECK DOWN.   Do not use on face/ open                           Wound or open sores. Avoid contact with eyes, ears mouth and genitals (private parts).                       Wash face,  Genitals (private parts) with your normal soap.             6.  Wash thoroughly, paying special attention to the area where your surgery  will be performed.  7.  Thoroughly rinse your body with warm water from the neck down.  8.  DO NOT shower/wash with your normal soap after using and rinsing off  the CHG Soap.                9.  Pat yourself dry with a clean towel.            10.  Wear clean pajamas.            11.  Place clean sheets on your bed the night of your first shower and do not  sleep with pets. Day of Surgery : Do not apply any lotions/deodorants the morning of surgery.  Please wear clean clothes to the hospital/surgery center.  FAILURE TO FOLLOW THESE INSTRUCTIONS MAY RESULT IN THE CANCELLATION OF YOUR SURGERY PATIENT SIGNATURE_________________________________  NURSE  SIGNATURE__________________________________  ________________________________________________________________________

## 2020-11-09 ENCOUNTER — Encounter (HOSPITAL_COMMUNITY)
Admission: RE | Admit: 2020-11-09 | Discharge: 2020-11-09 | Disposition: A | Discharge status: Home / Self Care | Payer: Medicaid Other | Source: Ambulatory Visit | Attending: Urology | Admitting: Urology

## 2020-11-09 ENCOUNTER — Other Ambulatory Visit: Payer: Self-pay

## 2020-11-09 ENCOUNTER — Encounter (HOSPITAL_COMMUNITY): Payer: Self-pay

## 2020-11-09 ENCOUNTER — Other Ambulatory Visit (HOSPITAL_COMMUNITY)
Admission: RE | Admit: 2020-11-09 | Discharge: 2020-11-09 | Disposition: A | Discharge status: Home / Self Care | Payer: Medicaid Other | Source: Ambulatory Visit | Attending: Urology | Admitting: Urology

## 2020-11-09 DIAGNOSIS — Z20822 Contact with and (suspected) exposure to covid-19: Secondary | ICD-10-CM | POA: Insufficient documentation

## 2020-11-09 DIAGNOSIS — Z01812 Encounter for preprocedural laboratory examination: Secondary | ICD-10-CM | POA: Insufficient documentation

## 2020-11-09 LAB — BASIC METABOLIC PANEL
Anion gap: 13 (ref 5–15)
BUN: 55 mg/dL — ABNORMAL HIGH (ref 6–20)
CO2: 24 mmol/L (ref 22–32)
Calcium: 9.8 mg/dL (ref 8.9–10.3)
Chloride: 105 mmol/L (ref 98–111)
Creatinine, Ser: 6.86 mg/dL — ABNORMAL HIGH (ref 0.61–1.24)
GFR, Estimated: 9 mL/min — ABNORMAL LOW (ref >60)
Glucose, Bld: 96 mg/dL (ref 70–99)
Potassium: 4.2 mmol/L (ref 3.5–5.1)
Sodium: 142 mmol/L (ref 135–145)

## 2020-11-09 LAB — SURGICAL PCR SCREEN
MRSA, PCR: NEGATIVE
Staphylococcus aureus: NEGATIVE

## 2020-11-09 LAB — CBC
HCT: 39.8 % (ref 39.0–52.0)
Hemoglobin: 12.9 g/dL — ABNORMAL LOW (ref 13.0–17.0)
MCH: 28.8 pg (ref 26.0–34.0)
MCHC: 32.4 g/dL (ref 30.0–36.0)
MCV: 88.8 fL (ref 80.0–100.0)
Platelets: 223 10*3/uL (ref 150–400)
RBC: 4.48 MIL/uL (ref 4.22–5.81)
RDW: 14.2 % (ref 11.5–15.5)
WBC: 12.5 10*3/uL — ABNORMAL HIGH (ref 4.0–10.5)
nRBC: 0 % (ref 0.0–0.2)

## 2020-11-09 NOTE — Progress Notes (Addendum)
COVID Vaccine Completed:Yes Date COVID Vaccine completed:01/31/20- Booster COVID vaccine manufacturer: Pfizer       PCP - Dr. Liana Crocker Cardiologist -no   Chest x-ray - no EKG - 03/23/20-epic Stress Test - no ECHO - no Cardiac Cath - no Pacemaker/ICD device last checked:NA  Sleep Study - no CPAP -   Fasting Blood Sugar - NA Checks Blood Sugar _____ times a day  Blood Thinner Instructions:NA Aspirin Instructions: Last Dose:  Anesthesia review:   Patient denies shortness of breath, fever, cough and chest pain at PAT appointment  yes Patient verbalized understanding of instructions that were given to them at the PAT appointment. 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 He has polycystic kidney disease and a partial nephrectomy on the Rt. an AV fistula was placed on the upper Lt arm in prep for dialysis post op. He reports no SOB with activities. He lives with his mother and uses medical transport services. His graft is healed well with a good bruit and thrill  Palpated.

## 2020-11-10 ENCOUNTER — Inpatient Hospital Stay (HOSPITAL_COMMUNITY): Payer: Medicaid Other | Admitting: Certified Registered Nurse Anesthetist

## 2020-11-10 ENCOUNTER — Other Ambulatory Visit (HOSPITAL_COMMUNITY): Payer: Self-pay | Admitting: Physician Assistant

## 2020-11-10 ENCOUNTER — Encounter (HOSPITAL_COMMUNITY)
Admission: RE | Disposition: A | Discharge status: Home / Self Care | Payer: Self-pay | Source: Home / Self Care | Attending: Urology

## 2020-11-10 ENCOUNTER — Other Ambulatory Visit: Payer: Self-pay

## 2020-11-10 ENCOUNTER — Inpatient Hospital Stay (HOSPITAL_COMMUNITY)
Admission: RE | Admit: 2020-11-10 | Discharge: 2020-11-14 | DRG: 656 | Disposition: A | Discharge status: Home / Self Care | Payer: Medicaid Other | Attending: Internal Medicine | Admitting: Internal Medicine

## 2020-11-10 ENCOUNTER — Encounter (HOSPITAL_COMMUNITY): Payer: Self-pay | Admitting: Urology

## 2020-11-10 DIAGNOSIS — Z992 Dependence on renal dialysis: Secondary | ICD-10-CM | POA: Diagnosis not present

## 2020-11-10 DIAGNOSIS — G40909 Epilepsy, unspecified, not intractable, without status epilepticus: Secondary | ICD-10-CM

## 2020-11-10 DIAGNOSIS — E785 Hyperlipidemia, unspecified: Secondary | ICD-10-CM | POA: Diagnosis present

## 2020-11-10 DIAGNOSIS — Z79899 Other long term (current) drug therapy: Secondary | ICD-10-CM

## 2020-11-10 DIAGNOSIS — N2889 Other specified disorders of kidney and ureter: Secondary | ICD-10-CM | POA: Diagnosis present

## 2020-11-10 DIAGNOSIS — C641 Malignant neoplasm of right kidney, except renal pelvis: Principal | ICD-10-CM | POA: Diagnosis present

## 2020-11-10 DIAGNOSIS — Z89011 Acquired absence of right thumb: Secondary | ICD-10-CM

## 2020-11-10 DIAGNOSIS — E8889 Other specified metabolic disorders: Secondary | ICD-10-CM | POA: Diagnosis present

## 2020-11-10 DIAGNOSIS — F17210 Nicotine dependence, cigarettes, uncomplicated: Secondary | ICD-10-CM | POA: Diagnosis present

## 2020-11-10 DIAGNOSIS — D631 Anemia in chronic kidney disease: Secondary | ICD-10-CM | POA: Diagnosis present

## 2020-11-10 DIAGNOSIS — I120 Hypertensive chronic kidney disease with stage 5 chronic kidney disease or end stage renal disease: Secondary | ICD-10-CM | POA: Diagnosis not present

## 2020-11-10 DIAGNOSIS — T82868A Thrombosis of vascular prosthetic devices, implants and grafts, initial encounter: Secondary | ICD-10-CM | POA: Diagnosis present

## 2020-11-10 DIAGNOSIS — T82818A Embolism of vascular prosthetic devices, implants and grafts, initial encounter: Secondary | ICD-10-CM | POA: Diagnosis not present

## 2020-11-10 DIAGNOSIS — N186 End stage renal disease: Secondary | ICD-10-CM | POA: Diagnosis not present

## 2020-11-10 DIAGNOSIS — M109 Gout, unspecified: Secondary | ICD-10-CM | POA: Diagnosis not present

## 2020-11-10 DIAGNOSIS — K432 Incisional hernia without obstruction or gangrene: Secondary | ICD-10-CM | POA: Diagnosis not present

## 2020-11-10 DIAGNOSIS — Y832 Surgical operation with anastomosis, bypass or graft as the cause of abnormal reaction of the patient, or of later complication, without mention of misadventure at the time of the procedure: Secondary | ICD-10-CM | POA: Diagnosis not present

## 2020-11-10 DIAGNOSIS — I10 Essential (primary) hypertension: Secondary | ICD-10-CM | POA: Diagnosis present

## 2020-11-10 DIAGNOSIS — Z888 Allergy status to other drugs, medicaments and biological substances status: Secondary | ICD-10-CM

## 2020-11-10 DIAGNOSIS — Z823 Family history of stroke: Secondary | ICD-10-CM

## 2020-11-10 DIAGNOSIS — E7800 Pure hypercholesterolemia, unspecified: Secondary | ICD-10-CM | POA: Diagnosis present

## 2020-11-10 DIAGNOSIS — T829XXS Unspecified complication of cardiac and vascular prosthetic device, implant and graft, sequela: Secondary | ICD-10-CM

## 2020-11-10 DIAGNOSIS — T829XXA Unspecified complication of cardiac and vascular prosthetic device, implant and graft, initial encounter: Secondary | ICD-10-CM

## 2020-11-10 DIAGNOSIS — Z20822 Contact with and (suspected) exposure to covid-19: Secondary | ICD-10-CM | POA: Diagnosis not present

## 2020-11-10 HISTORY — PX: LAPAROSCOPIC NEPHRECTOMY: SHX1930

## 2020-11-10 LAB — BASIC METABOLIC PANEL
Anion gap: 13 (ref 5–15)
BUN: 57 mg/dL — ABNORMAL HIGH (ref 6–20)
CO2: 24 mmol/L (ref 22–32)
Calcium: 9.2 mg/dL (ref 8.9–10.3)
Chloride: 105 mmol/L (ref 98–111)
Creatinine, Ser: 7.28 mg/dL — ABNORMAL HIGH (ref 0.61–1.24)
GFR, Estimated: 8 mL/min — ABNORMAL LOW (ref >60)
Glucose, Bld: 133 mg/dL — ABNORMAL HIGH (ref 70–99)
Potassium: 4.5 mmol/L (ref 3.5–5.1)
Sodium: 142 mmol/L (ref 135–145)

## 2020-11-10 LAB — TYPE AND SCREEN
ABO/RH(D): O POS
Antibody Screen: NEGATIVE

## 2020-11-10 LAB — HEMOGLOBIN AND HEMATOCRIT, BLOOD
HCT: 36.8 % — ABNORMAL LOW (ref 39.0–52.0)
Hemoglobin: 12 g/dL — ABNORMAL LOW (ref 13.0–17.0)

## 2020-11-10 LAB — SARS CORONAVIRUS 2 (TAT 6-24 HRS): SARS Coronavirus 2: NEGATIVE

## 2020-11-10 SURGERY — NEPHRECTOMY, RADICAL, LAPAROSCOPIC, ADULT
Anesthesia: General | Laterality: Left

## 2020-11-10 MED ORDER — MORPHINE SULFATE (PF) 4 MG/ML IV SOLN
2.0000 mg | INTRAVENOUS | Status: DC | PRN
  Administered 2020-11-11: 2 mg via INTRAVENOUS
  Filled 2020-11-10: qty 1

## 2020-11-10 MED ORDER — PROPOFOL 10 MG/ML IV BOLUS
INTRAVENOUS | Status: DC | PRN
  Administered 2020-11-10: 130 mg via INTRAVENOUS

## 2020-11-10 MED ORDER — HYDROMORPHONE HCL 1 MG/ML IJ SOLN: 0.2500 mg | INTRAMUSCULAR | Status: DC | PRN

## 2020-11-10 MED ORDER — PHENYLEPHRINE HCL-NACL 10-0.9 MG/250ML-% IV SOLN
INTRAVENOUS | Status: DC | PRN
  Administered 2020-11-10: 60 ug/min via INTRAVENOUS

## 2020-11-10 MED ORDER — HYDROMORPHONE HCL 1 MG/ML IJ SOLN
INTRAMUSCULAR | Status: DC | PRN
  Administered 2020-11-10: .5 mg via INTRAVENOUS

## 2020-11-10 MED ORDER — ROCURONIUM BROMIDE 100 MG/10ML IV SOLN
INTRAVENOUS | Status: DC | PRN
  Administered 2020-11-10: 60 mg via INTRAVENOUS
  Administered 2020-11-10: 40 mg via INTRAVENOUS

## 2020-11-10 MED ORDER — FENTANYL CITRATE (PF) 100 MCG/2ML IJ SOLN
INTRAMUSCULAR | Status: DC | PRN
  Administered 2020-11-10 (×2): 50 ug via INTRAVENOUS

## 2020-11-10 MED ORDER — CEFAZOLIN SODIUM-DEXTROSE 2-4 GM/100ML-% IV SOLN
2.0000 g | INTRAVENOUS | Status: AC
  Administered 2020-11-10: 2 g via INTRAVENOUS
  Filled 2020-11-10: qty 100

## 2020-11-10 MED ORDER — HYDROMORPHONE HCL 2 MG/ML IJ SOLN
INTRAMUSCULAR | Status: AC
  Filled 2020-11-10: qty 1

## 2020-11-10 MED ORDER — PHENYLEPHRINE HCL (PRESSORS) 10 MG/ML IV SOLN
INTRAVENOUS | Status: AC
  Filled 2020-11-10: qty 1

## 2020-11-10 MED ORDER — ALLOPURINOL 100 MG PO TABS
100.0000 mg | ORAL_TABLET | Freq: Two times a day (BID) | ORAL | Status: DC
  Administered 2020-11-11 – 2020-11-14 (×7): 100 mg via ORAL
  Filled 2020-11-10 (×8): qty 1

## 2020-11-10 MED ORDER — EPHEDRINE SULFATE-NACL 50-0.9 MG/10ML-% IV SOSY
PREFILLED_SYRINGE | INTRAVENOUS | Status: DC | PRN
  Administered 2020-11-10: 10 mg via INTRAVENOUS

## 2020-11-10 MED ORDER — FENTANYL CITRATE (PF) 100 MCG/2ML IJ SOLN
INTRAMUSCULAR | Status: DC | PRN
  Administered 2020-11-10: 100 ug via INTRAVENOUS

## 2020-11-10 MED ORDER — ONDANSETRON HCL 4 MG/2ML IJ SOLN
INTRAMUSCULAR | Status: DC | PRN
  Administered 2020-11-10: 4 mg via INTRAVENOUS

## 2020-11-10 MED ORDER — SUGAMMADEX SODIUM 200 MG/2ML IV SOLN
INTRAVENOUS | Status: DC | PRN
  Administered 2020-11-10: 350 mg via INTRAVENOUS

## 2020-11-10 MED ORDER — PROPOFOL 10 MG/ML IV BOLUS
INTRAVENOUS | Status: AC
  Filled 2020-11-10: qty 20

## 2020-11-10 MED ORDER — LACTATED RINGERS IR SOLN
Status: DC | PRN
  Administered 2020-11-10: 1000 mL

## 2020-11-10 MED ORDER — ATENOLOL 50 MG PO TABS
100.0000 mg | ORAL_TABLET | Freq: Every day | ORAL | Status: DC
  Administered 2020-11-11 – 2020-11-14 (×4): 100 mg via ORAL
  Filled 2020-11-10 (×2): qty 2
  Filled 2020-11-10: qty 1
  Filled 2020-11-10 (×2): qty 2
  Filled 2020-11-10: qty 1

## 2020-11-10 MED ORDER — DIPHENHYDRAMINE HCL 50 MG/ML IJ SOLN: 12.5000 mg | Freq: Four times a day (QID) | INTRAMUSCULAR | Status: DC | PRN

## 2020-11-10 MED ORDER — PHENYLEPHRINE 40 MCG/ML (10ML) SYRINGE FOR IV PUSH (FOR BLOOD PRESSURE SUPPORT)
PREFILLED_SYRINGE | INTRAVENOUS | Status: AC
  Filled 2020-11-10: qty 30

## 2020-11-10 MED ORDER — SODIUM CHLORIDE 0.9 % IV SOLN: INTRAVENOUS | Status: DC

## 2020-11-10 MED ORDER — DOCUSATE SODIUM 100 MG PO CAPS
100.0000 mg | ORAL_CAPSULE | Freq: Two times a day (BID) | ORAL | Status: DC
  Administered 2020-11-10 – 2020-11-14 (×8): 100 mg via ORAL
  Filled 2020-11-10 (×8): qty 1

## 2020-11-10 MED ORDER — MIDAZOLAM HCL 2 MG/2ML IJ SOLN
INTRAMUSCULAR | Status: AC
  Filled 2020-11-10: qty 2

## 2020-11-10 MED ORDER — DEXAMETHASONE SODIUM PHOSPHATE 10 MG/ML IJ SOLN
INTRAMUSCULAR | Status: DC | PRN
  Administered 2020-11-10: 8 mg via INTRAVENOUS

## 2020-11-10 MED ORDER — MIDAZOLAM HCL 5 MG/5ML IJ SOLN
INTRAMUSCULAR | Status: DC | PRN
  Administered 2020-11-10: 2 mg via INTRAVENOUS

## 2020-11-10 MED ORDER — STERILE WATER FOR IRRIGATION IR SOLN
Status: DC | PRN
  Administered 2020-11-10: 1000 mL

## 2020-11-10 MED ORDER — LIDOCAINE HCL (CARDIAC) PF 100 MG/5ML IV SOSY
PREFILLED_SYRINGE | INTRAVENOUS | Status: DC | PRN
  Administered 2020-11-10: 60 mg via INTRAVENOUS

## 2020-11-10 MED ORDER — ORAL CARE MOUTH RINSE
15.0000 mL | Freq: Two times a day (BID) | OROMUCOSAL | Status: DC
  Administered 2020-11-11 – 2020-11-13 (×4): 15 mL via OROMUCOSAL

## 2020-11-10 MED ORDER — ACETAMINOPHEN 10 MG/ML IV SOLN
1000.0000 mg | Freq: Four times a day (QID) | INTRAVENOUS | Status: AC
  Administered 2020-11-10 – 2020-11-11 (×4): 1000 mg via INTRAVENOUS
  Filled 2020-11-10 (×3): qty 100

## 2020-11-10 MED ORDER — BUPIVACAINE LIPOSOME 1.3 % IJ SUSP
20.0000 mL | Freq: Once | INTRAMUSCULAR | Status: AC
  Administered 2020-11-10: 20 mL
  Filled 2020-11-10: qty 20

## 2020-11-10 MED ORDER — LACTATED RINGERS IV SOLN: INTRAVENOUS | Status: DC

## 2020-11-10 MED ORDER — ACETAMINOPHEN 500 MG PO TABS
1000.0000 mg | ORAL_TABLET | Freq: Once | ORAL | Status: AC
  Administered 2020-11-10: 1000 mg via ORAL
  Filled 2020-11-10: qty 2

## 2020-11-10 MED ORDER — ORAL CARE MOUTH RINSE: 15.0000 mL | Freq: Once | OROMUCOSAL | Status: AC

## 2020-11-10 MED ORDER — LIDOCAINE HCL 2 % IJ SOLN
INTRAMUSCULAR | Status: AC
  Filled 2020-11-10: qty 20

## 2020-11-10 MED ORDER — ALBUMIN HUMAN 5 % IV SOLN
INTRAVENOUS | Status: AC
  Filled 2020-11-10: qty 250

## 2020-11-10 MED ORDER — LACTATED RINGERS IV SOLN: INTRAVENOUS | Status: DC | PRN

## 2020-11-10 MED ORDER — BUPIVACAINE-EPINEPHRINE 0.5% -1:200000 IJ SOLN
INTRAMUSCULAR | Status: DC | PRN
  Administered 2020-11-10: 10 mL

## 2020-11-10 MED ORDER — FUROSEMIDE 40 MG PO TABS
40.0000 mg | ORAL_TABLET | Freq: Two times a day (BID) | ORAL | Status: DC
  Administered 2020-11-11 (×2): 40 mg via ORAL
  Filled 2020-11-10 (×2): qty 1

## 2020-11-10 MED ORDER — DIPHENHYDRAMINE HCL 12.5 MG/5ML PO ELIX
12.5000 mg | ORAL_SOLUTION | Freq: Four times a day (QID) | ORAL | Status: DC | PRN
Start: 2020-11-10 — End: 2020-11-14
  Filled 2020-11-10: qty 5

## 2020-11-10 MED ORDER — LEVETIRACETAM 250 MG PO TABS
500.0000 mg | ORAL_TABLET | Freq: Two times a day (BID) | ORAL | Status: DC
Start: 2020-11-10 — End: 2020-11-14
  Administered 2020-11-10 – 2020-11-14 (×8): 500 mg via ORAL
  Filled 2020-11-10: qty 1
  Filled 2020-11-10 (×4): qty 2
  Filled 2020-11-10 (×3): qty 1

## 2020-11-10 MED ORDER — ONDANSETRON HCL 4 MG/2ML IJ SOLN
INTRAMUSCULAR | Status: AC
  Filled 2020-11-10: qty 2

## 2020-11-10 MED ORDER — BUPIVACAINE-EPINEPHRINE 0.5% -1:200000 IJ SOLN
INTRAMUSCULAR | Status: AC
  Filled 2020-11-10: qty 1

## 2020-11-10 MED ORDER — TRAMADOL HCL 50 MG PO TABS
50.0000 mg | ORAL_TABLET | Freq: Four times a day (QID) | ORAL | 0 refills | Status: DC | PRN
Start: 2020-11-10 — End: 2021-08-15

## 2020-11-10 MED ORDER — ROCURONIUM BROMIDE 10 MG/ML (PF) SYRINGE
PREFILLED_SYRINGE | INTRAVENOUS | Status: AC
  Filled 2020-11-10: qty 10

## 2020-11-10 MED ORDER — OXYCODONE HCL 5 MG PO TABS
5.0000 mg | ORAL_TABLET | ORAL | Status: DC | PRN
Start: 2020-11-10 — End: 2020-11-11

## 2020-11-10 MED ORDER — FENTANYL CITRATE (PF) 100 MCG/2ML IJ SOLN
INTRAMUSCULAR | Status: AC
  Filled 2020-11-10: qty 2

## 2020-11-10 MED ORDER — DEXAMETHASONE SODIUM PHOSPHATE 10 MG/ML IJ SOLN
INTRAMUSCULAR | Status: AC
  Filled 2020-11-10: qty 1

## 2020-11-10 MED ORDER — DEXTROSE-NACL 5-0.45 % IV SOLN: INTRAVENOUS | Status: DC

## 2020-11-10 MED ORDER — CHLORHEXIDINE GLUCONATE 0.12 % MT SOLN
15.0000 mL | Freq: Once | OROMUCOSAL | Status: AC
  Administered 2020-11-10: 15 mL via OROMUCOSAL

## 2020-11-10 MED ORDER — AMLODIPINE BESYLATE 5 MG PO TABS
10.0000 mg | ORAL_TABLET | Freq: Every day | ORAL | Status: DC
  Administered 2020-11-11 – 2020-11-14 (×4): 10 mg via ORAL
  Filled 2020-11-10 (×2): qty 1
  Filled 2020-11-10 (×2): qty 2

## 2020-11-10 MED ORDER — ONDANSETRON HCL 4 MG/2ML IJ SOLN
4.0000 mg | INTRAMUSCULAR | Status: DC | PRN
  Administered 2020-11-11 – 2020-11-12 (×2): 4 mg via INTRAVENOUS
  Filled 2020-11-10 (×2): qty 2

## 2020-11-10 MED ORDER — CEFAZOLIN SODIUM-DEXTROSE 1-4 GM/50ML-% IV SOLN
1.0000 g | Freq: Three times a day (TID) | INTRAVENOUS | Status: AC
  Administered 2020-11-10 – 2020-11-11 (×2): 1 g via INTRAVENOUS
  Filled 2020-11-10 (×2): qty 50

## 2020-11-10 MED ORDER — PHENYLEPHRINE 40 MCG/ML (10ML) SYRINGE FOR IV PUSH (FOR BLOOD PRESSURE SUPPORT)
PREFILLED_SYRINGE | INTRAVENOUS | Status: AC
  Filled 2020-11-10: qty 10

## 2020-11-10 MED ORDER — CALCITRIOL 0.25 MCG PO CAPS
0.2500 ug | ORAL_CAPSULE | Freq: Every day | ORAL | Status: DC
  Administered 2020-11-11 – 2020-11-14 (×4): 0.25 ug via ORAL
  Filled 2020-11-10 (×4): qty 1

## 2020-11-10 MED FILL — traMADol HCL 50 MG TABS: 50 | 2 days supply | Qty: 20 | Fill #0

## 2020-11-10 SURGICAL SUPPLY — 59 items
APPLICATOR ARISTA FLEXITIP XL (MISCELLANEOUS) ×2 IMPLANT
APPLICATOR SURGIFLO ENDO (HEMOSTASIS) IMPLANT
APPLIER CLIP ROT 10 11.4 M/L (STAPLE)
BAG LAPAROSCOPIC 12 15 PORT 16 (BASKET) ×1 IMPLANT
BAG RETRIEVAL 12/15 (BASKET) ×2
BAG ZIPLOCK 12X15 (MISCELLANEOUS) ×2 IMPLANT
BLADE EXTENDED COATED 6.5IN (ELECTRODE) IMPLANT
BLADE SURG SZ10 CARB STEEL (BLADE) IMPLANT
CHLORAPREP W/TINT 26 (MISCELLANEOUS) ×2 IMPLANT
CLIP APPLIE ROT 10 11.4 M/L (STAPLE) IMPLANT
CLIP VESOLOCK LG 6/CT PURPLE (CLIP) ×2 IMPLANT
CLIP VESOLOCK MED LG 6/CT (CLIP) ×2 IMPLANT
CLIP VESOLOCK XL 6/CT (CLIP) ×2 IMPLANT
COVER WAND RF STERILE (DRAPES) ×2 IMPLANT
CUTTER FLEX LINEAR 45M (STAPLE) ×2 IMPLANT
DECANTER SPIKE VIAL GLASS SM (MISCELLANEOUS) ×2 IMPLANT
DERMABOND ADVANCED (GAUZE/BANDAGES/DRESSINGS) ×1
DERMABOND ADVANCED .7 DNX12 (GAUZE/BANDAGES/DRESSINGS) ×1 IMPLANT
DRAPE INCISE IOBAN 66X45 STRL (DRAPES) ×2 IMPLANT
DRAPE WARM FLUID 44X44 (DRAPES) IMPLANT
ELECT PENCIL ROCKER SW 15FT (MISCELLANEOUS) ×2 IMPLANT
ELECT REM PT RETURN 15FT ADLT (MISCELLANEOUS) ×2 IMPLANT
GLOVE SURG ENC TEXT LTX SZ7.5 (GLOVE) ×2 IMPLANT
GOWN STRL REUS W/TWL LRG LVL3 (GOWN DISPOSABLE) ×4 IMPLANT
HEMOSTAT ARISTA ABSORB 3G PWDR (HEMOSTASIS) ×2 IMPLANT
HEMOSTAT SURGICEL 2X3 (HEMOSTASIS) ×2 IMPLANT
HEMOSTAT SURGICEL 4X8 (HEMOSTASIS) IMPLANT
IRRIG SUCT STRYKERFLOW 2 WTIP (MISCELLANEOUS) ×2
IRRIGATION SUCT STRKRFLW 2 WTP (MISCELLANEOUS) ×1 IMPLANT
KIT BASIN OR (CUSTOM PROCEDURE TRAY) ×2 IMPLANT
KIT TURNOVER KIT A (KITS) IMPLANT
MANIFOLD NEPTUNE II (INSTRUMENTS) ×2 IMPLANT
NEEDLE INSUFFLATION 14GA 120MM (NEEDLE) IMPLANT
NEEDLE SPNL 22GX3.5 QUINCKE BK (NEEDLE) IMPLANT
PAD POSITIONING PINK XL (MISCELLANEOUS) ×2 IMPLANT
PROTECTOR NERVE ULNAR (MISCELLANEOUS) ×4 IMPLANT
RELOAD 45 VASCULAR/THIN (ENDOMECHANICALS) ×4 IMPLANT
RELOAD STAPLE TA45 3.5 REG BLU (ENDOMECHANICALS) IMPLANT
SCISSORS LAP 5X35 DISP (ENDOMECHANICALS) IMPLANT
SET TUBE SMOKE EVAC HIGH FLOW (TUBING) ×2 IMPLANT
SHEARS HARMONIC ACE PLUS 36CM (ENDOMECHANICALS) ×2 IMPLANT
SLEEVE XCEL OPT CAN 5 100 (ENDOMECHANICALS) ×2 IMPLANT
SPONGE LAP 4X18 RFD (DISPOSABLE) IMPLANT
SUT MNCRL AB 4-0 PS2 18 (SUTURE) ×4 IMPLANT
SUT PDS AB 0 CT1 36 (SUTURE) ×2 IMPLANT
SUT VIC AB 2-0 CT1 27 (SUTURE) ×1
SUT VIC AB 2-0 CT1 27XBRD (SUTURE) IMPLANT
SUT VIC AB 2-0 CT1 TAPERPNT 27 (SUTURE) ×1 IMPLANT
SUT VIC AB 3-0 SH 27 (SUTURE) ×1
SUT VIC AB 3-0 SH 27XBRD (SUTURE) ×1 IMPLANT
SUT VICRYL 0 UR6 27IN ABS (SUTURE) ×2 IMPLANT
TAPE CLOTH 4X10 WHT NS (GAUZE/BANDAGES/DRESSINGS) IMPLANT
TOWEL OR 17X26 10 PK STRL BLUE (TOWEL DISPOSABLE) ×2 IMPLANT
TOWEL OR NON WOVEN STRL DISP B (DISPOSABLE) ×2 IMPLANT
TRAY FOLEY MTR SLVR 16FR STAT (SET/KITS/TRAYS/PACK) ×2 IMPLANT
TRAY LAPAROSCOPIC (CUSTOM PROCEDURE TRAY) ×2 IMPLANT
TROCAR BLADELESS OPT 5 100 (ENDOMECHANICALS) ×2 IMPLANT
TROCAR UNIVERSAL OPT 12M 100M (ENDOMECHANICALS) ×2 IMPLANT
TROCAR XCEL 12X100 BLDLESS (ENDOMECHANICALS) ×2 IMPLANT

## 2020-11-10 NOTE — Interval H&P Note (Signed)
History and Physical Interval Note:  11/10/2020 12:50 PM  Logan Decker  has presented today for surgery, with the diagnosis of LEFT RENAL MASS.  The various methods of treatment have been discussed with the patient and family. After consideration of risks, benefits and other options for treatment, the patient has consented to  Procedure(s): LEFT LAPAROSCOPIC RADICAL NEPHRECTOMY (Left) as a surgical intervention.  The patient's history has been reviewed, patient examined, no change in status, stable for surgery.  I have reviewed the patient's chart and labs.  Questions were answered to the patient's satisfaction.     Ardis Hughs

## 2020-11-10 NOTE — H&P (Signed)
f/u for kidney cancer  HPI: Logan Decker is a 60 year-old male established patient who is here for kidney cancer follow-up.  The patient is s/p right partial nephrectomy. His surgery was done 12/06/2017.   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 The resection margins were negative.   The results of the CT scan demonstrated MRI - 3/20 - slight interval progression of left lesions.   The patient had labs prior to his office visit today. Pertinent labs: April 2021: Cr - 4.6, GFR 13.   He is not having flank pain. He has not had blood in his urine recently. He has not recently had unwanted weight loss. He is having normal bowel movements. He does have a good appetite. He is not having pain in new locations. The patient isnot complaining of incisional hernia or pain.   MRI 12/2018:  Left side enhancing lesions -  #1 - left lower pole: complex cyst (previously [place] 69F) grown from 4.5x4.8x4.3 to 4.7x5.2x4.6.  #2 - left anterior upper pole: 1.7cm stable  #3- left posterior interpole 2.0 cm stable   10/17/20: Left lower pole: 9.1 x 8.0 x 5.7 cm lobulated mass, no evidence of metastatic disease   The patient was scheduled for left partial nephrectomy in May 2020 but was post-poned due to Covid. He was lost to follow-up.   Intv: Patient presents today for follow-up after obtaining a a MRI of his kidney. This demonstrates a significant increase in the size of his renal mass, nearly doubling in size. The patient rose by care, 5 miles. Continues to power through his fatigue. Scheduled to see his nephrologist next week. Denies any night sweats or unintended weight loss.     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, left arm-fistula placement 08/2020   NON-GU PSH: Brain Aneurysm Repr; Simple, 2003     GU PMH: Left renal neoplasm - 09/19/2020, - 01/04/2019 Renal cell carcinoma, right - 09/19/2020, - 01/04/2019, - 06/23/2018, - 2019 Benign Neo Kidney, Unspec - 2018, - 2018 Chronic kidney disease stage 4 (GFR 15-30)      PMH Notes: kidney failure   NON-GU PMH: Gout Hyperlipidemia, unspecified Hypertension Seizure disorder    FAMILY HISTORY: 3 daughters - Daughter 3 Son's - Son stroke - Father   SOCIAL HISTORY: Marital Status: Divorced Preferred Language: English; Ethnicity: Not Hispanic Or Latino; Race: Other Race Current Smoking Status: Patient smokes. Has smoked since 11/22/1987. Smokes less than 1/2 pack per day.   Tobacco Use Assessment Completed: Used Tobacco in last 30 days? Does not use smokeless tobacco. Has never drank.  Does not use drugs. Drinks 2 caffeinated drinks per day. Has not had a blood transfusion. Patient's occupation is/was Disabled.     Notes: Trying to quit smoking. Down to 1-2 cigarettes/day.   REVIEW OF SYSTEMS:    GU Review Male:   Patient denies frequent urination, hard  to postpone urination, burning/ pain with urination, get up at night to urinate, leakage of urine, stream starts and stops, trouble starting your stream, have to strain to urinate , erection problems, and penile pain.  Gastrointestinal (Upper):   Patient denies nausea, vomiting, and indigestion/ heartburn.  Gastrointestinal (Lower):   Patient denies diarrhea and constipation.  Constitutional:   Patient denies fever, night sweats, weight loss, and fatigue.  Skin:   Patient denies skin rash/ lesion and itching.  Eyes:   Patient denies blurred vision and double  vision.  Ears/ Nose/ Throat:   Patient denies sore throat and sinus problems.  Hematologic/Lymphatic:   Patient denies swollen glands and easy bruising.  Cardiovascular:   Patient denies leg swelling and chest pains.  Respiratory:   Patient denies shortness of breath and cough.  Endocrine:   Patient denies excessive thirst.  Musculoskeletal:   Patient denies back pain and joint pain.  Neurological:   Patient denies headaches and dizziness.  Psychologic:   Patient denies depression and anxiety.   VITAL SIGNS:      10/31/2020 08:24 AM  Weight 222 lb / 100.7 kg  Height 66 in / 167.64 cm  BP 151/90 mmHg  Pulse 82 /min  BMI 35.8 kg/m   MULTI-SYSTEM PHYSICAL EXAMINATION:    Constitutional: Well-nourished. No physical deformities. Normally developed. Good grooming.  Respiratory: Normal breath sounds. No labored breathing, no use of accessory muscles.   Cardiovascular: Regular rate and rhythm. No murmur, no gallop. Normal temperature, normal extremity pulses, no swelling, no varicosities.      Complexity of Data:  Records Review:   Previous Doctor Records, Previous Patient Records  Urine Test Review:   Urinalysis  X-Ray Review: MRI Abdomen: Reviewed Films. Discussed With Patient.     PROCEDURES:          Urinalysis w/Scope - 81001 Dipstick Dipstick Cont'd Micro  Color: Yellow Bilirubin: Neg WBC/hpf: 6 - 10/hpf  Appearance: Clear Ketones: Neg RBC/hpf: 0 - 2/hpf  Specific Gravity: 1.025 Blood: 1+ Bacteria: Few (10-25/hpf)  pH: 6.5 Protein: 3+ Cystals: NS (Not Seen)  Glucose: Neg Urobilinogen: 0.2 Casts: NS (Not Seen)    Nitrites: Neg Trichomonas: Not Present    Leukocyte Esterase: Neg Mucous: Not Present      Epithelial Cells: 0 - 5/hpf      Yeast: NS (Not Seen)      Sperm: Not Present    Notes:      ASSESSMENT:      ICD-10 Details  1 GU:   Left renal neoplasm - O67.672   2   Renal cell carcinoma, right - C64.1    PLAN:           Document Letter(s):  Created for  Patient: Clinical Summary    The patient and I talked at length about treatment options of kidney cancer. Etiologies of kidney cancer were discussed with the patient.   Treatment options were discussed with the patient including radical nephrectomy, partial nephrectomy, radical nephroureterectomy, and ablative procedures. The possible need for postoperative chemotherapy, radiation therapy, and immunotherapy were discussed. The pathological grades of kidney cancer, stages of kidney cancer including possible local nodal spread, local extension, renal vein involvement, and IVC involvement were discussed as well. The risks, benefits, and some of the possible complications of all of the above treatment options were discussed with the patient as well.   We discussed the possible future recurrence of kidney cancer. Alternative treatment options were discussed with the patient in detail. All  questions were answered.   The patient gave informed consent to proceed with a laparoscopic radical nephrectomy for treatment of the clinically localized kidney cancer. They understands that the surgery may need to be done through and open incision if the laparoscopic approach is not possible.        Notes:   The patient has had a significant increase in the size of the mass in the left kidney, which at this point has nearly doubled in size over the last 18 months. I recommended that we proceed with laparoscopic radical nephrectomy a soon as possible. This will certainly tip him into end-stage renal failure in require him to initiate dialysis. The patient has been anticipating this now for the last 10 years. He has a fistula created. He follows up with his nephrologist next week for further discussion about that. I anticipate that he would probably need his 1st dialysis postop. We will consult Nephrology at the time of his surgery for inpatient consultation.

## 2020-11-10 NOTE — Transfer of Care (Signed)
Immediate Anesthesia Transfer of Care Note  Patient: Logan Decker  Procedure(s) Performed: LEFT LAPAROSCOPIC RADICAL NEPHRECTOMY (Left )  Patient Location: PACU  Anesthesia Type:General  Level of Consciousness: awake  Airway & Oxygen Therapy: Patient Spontanous Breathing and Patient connected to face mask oxygen  Post-op Assessment: Report given to RN and Post -op Vital signs reviewed and stable  Post vital signs: Reviewed and stable  Last Vitals:  Vitals Value Taken Time  BP 127/87 11/10/20 1641  Temp    Pulse 70 11/10/20 1642  Resp 18 11/10/20 1643  SpO2 92 % 11/10/20 1642  Vitals shown include unvalidated device data.  Last Pain:  Vitals:   11/10/20 1006  TempSrc:   PainSc: 0-No pain         Complications: No complications documented.  Pt with little movement on left side- uncoordinated movement with R arm- pupils  equal and reactive- Dr Ola Spurr aware

## 2020-11-10 NOTE — Anesthesia Preprocedure Evaluation (Addendum)
Anesthesia Evaluation  Patient identified by MRN, date of birth, ID band Patient awake    Reviewed: Allergy & Precautions, H&P , NPO status , Patient's Chart, lab work & pertinent test results, reviewed documented beta blocker date and time   Airway Mallampati: III  TM Distance: >3 FB Neck ROM: Full    Dental no notable dental hx. (+) Edentulous Upper, Edentulous Lower, Dental Advisory Given   Pulmonary Current Smoker and Patient abstained from smoking.,    Pulmonary exam normal breath sounds clear to auscultation       Cardiovascular hypertension, Pt. on medications and Pt. on home beta blockers  Rhythm:Regular Rate:Normal     Neuro/Psych Seizures -, Well Controlled,  negative psych ROS   GI/Hepatic negative GI ROS, Neg liver ROS,   Endo/Other  Morbid obesity  Renal/GU Renal InsufficiencyRenal disease  negative genitourinary   Musculoskeletal  (+) Arthritis , Osteoarthritis,    Abdominal   Peds  Hematology negative hematology ROS (+)   Anesthesia Other Findings   Reproductive/Obstetrics negative OB ROS                            Anesthesia Physical Anesthesia Plan  ASA: III  Anesthesia Plan: General   Post-op Pain Management:    Induction: Intravenous  PONV Risk Score and Plan: 2 and Ondansetron, Dexamethasone and Midazolam  Airway Management Planned: Oral ETT  Additional Equipment:   Intra-op Plan:   Post-operative Plan: Extubation in OR  Informed Consent: I have reviewed the patients History and Physical, chart, labs and discussed the procedure including the risks, benefits and alternatives for the proposed anesthesia with the patient or authorized representative who has indicated his/her understanding and acceptance.     Dental advisory given  Plan Discussed with: CRNA  Anesthesia Plan Comments:         Anesthesia Quick Evaluation

## 2020-11-10 NOTE — Anesthesia Procedure Notes (Deleted)
Performed by: Rosaland Lao, CRNA

## 2020-11-10 NOTE — Anesthesia Procedure Notes (Addendum)
Procedure Name: Intubation Date/Time: 11/10/2020 1:18 PM Performed by: Rosaland Lao, CRNA Pre-anesthesia Checklist: Patient identified, Emergency Drugs available, Suction available and Patient being monitored Patient Re-evaluated:Patient Re-evaluated prior to induction Oxygen Delivery Method: Circle system utilized Preoxygenation: Pre-oxygenation with 100% oxygen Induction Type: IV induction Ventilation: Mask ventilation without difficulty Laryngoscope Size: Miller and 2 Grade View: Grade II Tube type: Oral Tube size: 7.5 mm Number of attempts: 1 Airway Equipment and Method: Stylet and Oral airway Placement Confirmation: ETT inserted through vocal cords under direct vision,  positive ETCO2 and breath sounds checked- equal and bilateral Secured at: 23 cm Tube secured with: Tape Dental Injury: Teeth and Oropharynx as per pre-operative assessment

## 2020-11-10 NOTE — Anesthesia Postprocedure Evaluation (Signed)
Anesthesia Post Note  Patient: Logan Decker  Procedure(s) Performed: LEFT LAPAROSCOPIC RADICAL NEPHRECTOMY (Left )     Patient location during evaluation: PACU Anesthesia Type: General Level of consciousness: awake and alert Pain management: pain level controlled Vital Signs Assessment: post-procedure vital signs reviewed and stable Respiratory status: spontaneous breathing, nonlabored ventilation, respiratory function stable and patient connected to nasal cannula oxygen Cardiovascular status: blood pressure returned to baseline and stable Postop Assessment: no apparent nausea or vomiting Anesthetic complications: no   No complications documented.  Last Vitals:  Vitals:   11/10/20 1715 11/10/20 1730  BP: (!) 139/93 (!) 123/92  Pulse: 70 67  Resp: 20 20  Temp:  36.6 C  SpO2: 95% 95%    Last Pain:  Vitals:   11/10/20 1730  TempSrc:   PainSc: 3                  Tehani Mersman,W. EDMOND

## 2020-11-10 NOTE — Op Note (Signed)
Preoperative diagnosis:  1. Leftt renal mass   Postoperative diagnosis:  1. same   Procedure: 1. Laparoscopic left radical nephrectomy  Surgeon: Ardis Hughs, MD Resident Assistant: Carmie Kanner, MD First Assistant: Debbrah Alar, PA-C  Anesthesia: General  Complications: None  Intraoperative findings:  #1 - large exophytic renal mass #2 - non- adrenal sparing  EBL: 274mL  Specimens:  Left kidney and proximal ureter  Indication: Logan Decker is a 60 y.o. patient with large lower pole left renal mass.  After reviewing the management options for treatment, he elected to proceed with the above surgical procedure(s). We have discussed the potential benefits and risks of the procedure, side effects of the proposed treatment, the likelihood of the patient achieving the goals of the procedure, and any potential problems that might occur during the procedure or recuperation. Informed consent has been obtained.  Description of procedure:  The case was performed hand and hand with my first assistant, PA Debbrah Alar, and I could not do the case without her. She was involved from placing the ports, intraoperative procedure, and closing of the port incisions.  A site was selected lateral to the umbilicus for placement of the camera port. This was placed using a standard open Hassan technique which allowed entry into the peritoneal cavity under direct vision and without difficulty. A 12 mm Hassan cannula was placed and a pneumoperitoneum established. The camera was then used to inspect the abdomen and there was no evidence of any intra-abdominal injuries or other abnormalities. The remaining abdominal ports were then placed. One 5 mm trocar was placed subcostal margin in the left upper quadrant, the second 5 mm trocar was placed laterally to the camera port so as to triangulate the kidney. An assistant port was then placed in between the camera and the left lateral port. The assistant port  was a 12 mm port.   The white line of Toldt was incised allowing the colon to be mobilized medially and the plane between the mesocolon and the anterior layer of Gerotas fascia to be developed and the kidney exposed. The ureter and gonadal vein were identified inferiorly and the ureter was lifted anteriorly off the psoas muscle. The gonadal vein was then dissected out inferior to the lower pole and 2 clips were placed both superiorly and inferiorly and then ligated. A second 5 mm port was placed in the left lower quadrant to help facilitate lifting up of the kidney. Dissection proceeded superiorly along the gonadal vein until the renal vein was identified. The renal hilum was then carefully isolated with a combination of blunt and sharp dissectiong allowing the renal arterial and venous structures to be separated and isolated.   The renal artery was isolated and ligated with a 45 mm Flex ETS stapler. The renal vein was then isolated and also ligated and divided with a 45 mm Flex ETS stapler.   Once the hilum had been ligated dissection ensued from the inferior pole of the kidney. The ureter was transected placing 2 clips on the stay side and one on the specimen side. The lateral attachments of the kidney were then freed. Our attention was then turned to the upper pole which was dissected off of the spleen and the splenic attachments. The pancreas and colon were noted to be well away from the structures. The remaining of the posterior aspect of the attachments was then ligated using a Harmonic Scalpel. Once the kidney was freed from its attachments it was shown to medially and  the vascular hilum was inspected and noted to be sufficiently hemostatic. There was slight bit of oozing from the adrenal gland which was cauterized and then Surgicel placed over top. Insufflation was then turned down to 8 mm mercury and the kidney bed was noted to be hemostatic.   80cc of Exparel was then injected into the left  anterior axillary line b/w the iliac crest and the twelfth rib under laparoscopic guidance. The layer between the tranversus abdominus and the internal oblique was targeted.   The kidney/ureter specimen was then placed into a 12 mm Endocatch II retrieval bag, this was passed through the port site of the assistant port and left lower quadrant. The trochars were then removed under visual guidance to ensure no ongoing port site bleeding was occurring. The extraction incision was extended from the 12 mm left lower quadrant port. The external oblique and internal oblique muscles were spread as best as possible with as little muscle fibers ligated as possible in order to safely extracted the specimen. The internal oblique flash of was then closed with 2-0 Vicryl in an interrupted figure-of-eight fashion. The external oblique fascia was closed with a 0 looped PDS. The camera port was then closed with 2-0 Vicryl the level of the fascia. All incisions were injected with Exparel and reapproximated at the skin with 4-0 monocryl sutures. Dermabond was applied to the skin. The patient tolerated the procedure well and without complications and was transferred to the recovery unit in satisfactory condition.

## 2020-11-10 NOTE — Anesthesia Procedure Notes (Signed)
Date/Time: 11/10/2020 4:28 PM Performed by: Cynda Familia, CRNA Oxygen Delivery Method: Simple face mask Placement Confirmation: positive ETCO2 and breath sounds checked- equal and bilateral Dental Injury: Teeth and Oropharynx as per pre-operative assessment

## 2020-11-11 ENCOUNTER — Encounter (HOSPITAL_COMMUNITY): Payer: Self-pay | Admitting: Urology

## 2020-11-11 LAB — BASIC METABOLIC PANEL
Anion gap: 15 (ref 5–15)
BUN: 61 mg/dL — ABNORMAL HIGH (ref 6–20)
CO2: 19 mmol/L — ABNORMAL LOW (ref 22–32)
Calcium: 9 mg/dL (ref 8.9–10.3)
Chloride: 102 mmol/L (ref 98–111)
Creatinine, Ser: 7.38 mg/dL — ABNORMAL HIGH (ref 0.61–1.24)
GFR, Estimated: 8 mL/min — ABNORMAL LOW (ref >60)
Glucose, Bld: 207 mg/dL — ABNORMAL HIGH (ref 70–99)
Potassium: 4.7 mmol/L (ref 3.5–5.1)
Sodium: 136 mmol/L (ref 135–145)

## 2020-11-11 LAB — URINE CULTURE: Culture: <10000 — AB

## 2020-11-11 LAB — HEMOGLOBIN AND HEMATOCRIT, BLOOD
HCT: 35.1 % — ABNORMAL LOW (ref 39.0–52.0)
Hemoglobin: 11.4 g/dL — ABNORMAL LOW (ref 13.0–17.0)

## 2020-11-11 MED ORDER — FUROSEMIDE 10 MG/ML IJ SOLN
60.0000 mg | Freq: Once | INTRAMUSCULAR | Status: AC
  Administered 2020-11-11: 60 mg via INTRAVENOUS
  Filled 2020-11-11: qty 6

## 2020-11-11 MED ORDER — OXYCODONE HCL 5 MG PO TABS
5.0000 mg | ORAL_TABLET | ORAL | Status: DC | PRN
  Administered 2020-11-11 – 2020-11-13 (×4): 10 mg via ORAL
  Filled 2020-11-11 (×4): qty 2

## 2020-11-11 MED ORDER — FUROSEMIDE 10 MG/ML IJ SOLN
60.0000 mg | Freq: Once | INTRAMUSCULAR | Status: AC
  Administered 2020-11-12: 60 mg via INTRAVENOUS
  Filled 2020-11-11: qty 6

## 2020-11-11 NOTE — Consult Note (Signed)
[facility] Renal Consultation Note  Requesting MD: Louis Meckel MD Indication for Consultation:  CKD   Chief complaint: kidney removed  HPI:  Logan Decker is a 60 y.o. male with a history of CKD V now with progression over the past year.  Cr 6.21 on 11/01/20 now is s/p nephrectomy for renal mass.  Nephrology is consulted for management of CKD.  He has an AVF in place which appears suitable for cannulation per charting.  States he got up and walked around and did just urinate.  A little short of breath after walking around.  He had some nausea earlier and thinks that is related to what he ate.  He was told would need to start HD after surgery and I agree.  He follows with Dr. Posey Pronto at our office. He's had quite a bit of discomfort post-op.   Creatinine, Ser  Date/Time Value Ref Range Status  11/11/2020 12:48 AM 7.38 (H) 0.61 - 1.24 mg/dL Final  11/10/2020 05:38 PM 7.28 (H) 0.61 - 1.24 mg/dL Final  11/09/2020 01:16 PM 6.86 (H) 0.61 - 1.24 mg/dL Final  05/30/2020 07:02 AM 5.60 (H) 0.61 - 1.24 mg/dL Final  03/23/2020 06:58 AM 5.90 (H) 0.61 - 1.24 mg/dL Final  12/08/2017 05:27 AM 3.34 (H) 0.61 - 1.24 mg/dL Final  12/07/2017 05:41 AM 3.51 (H) 0.61 - 1.24 mg/dL Final  12/06/2017 05:58 PM 3.73 (H) 0.61 - 1.24 mg/dL Final  12/06/2017 04:50 AM 3.35 (H) 0.61 - 1.24 mg/dL Final  12/01/2017 12:00 PM 2.44 (H) 0.61 - 1.24 mg/dL Final  08/11/2017 05:20 AM 2.04 (H) 0.61 - 1.24 mg/dL Final  08/10/2017 05:51 AM 2.15 (H) 0.61 - 1.24 mg/dL Final  08/09/2017 08:12 AM 2.21 (H) 0.61 - 1.24 mg/dL Final  08/08/2017 01:26 AM 1.94 (H) 0.61 - 1.24 mg/dL Final     PMHx:   Past Medical History:  Diagnosis Date  . Arthritis    "knees" (08/08/2017)  . Brain aneurysm    ruptured brain aneurysm, treated surgically, was very sick, 2002/01/20, "almost died"  . CKD (chronic kidney disease) stage 4, GFR 15-29 ml/min (HCC)   . Gout   . High cholesterol   . Hypertension   . Seizures ([place])    "because  of scaring on brain tissue after OR for ruptured aneursym; on daily RX; last sz was in 07/2017" (08/08/2017)  . Wears dentures     Past Surgical History:  Procedure Laterality Date  . AMPUTATION Right 08/08/2017   Procedure: AMPUTATION right tumb;  Surgeon: Milly Jakob, MD;  Location: [facility];  Service: Orthopedics;  Laterality: Right;  . AMPUTATION FINGER Right 08/08/2017   thumb  . AV FISTULA PLACEMENT Left 03/23/2020   Procedure: LEFT ARM ARTERIOVENOUS (AV) FISTULA CREATION;  Surgeon: Waynetta Sandy, MD;  Location: [facility];  Service: Vascular;  Laterality: Left;  . CEREBRAL ANEURYSM REPAIR  01-20-2002   [employer]  . COLONOSCOPY    . FISTULA SUPERFICIALIZATION Left 05/30/2020   Procedure: LEFT BRACHIOCEPHALIC Transposition of FISTULA .;  Surgeon: Waynetta Sandy, MD;  Location: [facility];  Service: Vascular;  Laterality: Left;  . LAPAROSCOPIC NEPHRECTOMY Left 11/10/2020   Procedure: LEFT LAPAROSCOPIC RADICAL NEPHRECTOMY;  Surgeon: Ardis Hughs, MD;  Location: [facility];  Service: Urology;  Laterality: Left;  [name] MULTIPLE TOOTH EXTRACTIONS    . ROBOTIC ASSITED PARTIAL NEPHRECTOMY Right 12/05/2017   Procedure: XI ROBOTIC ASSITED LAPAROSCOPIC RIGHT PARTIAL NEPHRECTOMY;  Surgeon: Ardis Hughs, MD;  Location: [facility];  Service: Urology;  Laterality: Right;  [name] VARICOSE VEIN SURGERY Bilateral    legs    Family Hx: History reviewed. No pertinent family history.  Social History:  reports that he has been smoking cigarettes. He has smoked for the past 35.00 years. He has never used smokeless tobacco. He reports that he does not drink alcohol and does not use drugs.  Allergies:  Allergies  Allergen Reactions  . Ace Inhibitors Swelling    *Lotension   . Zonisamide Rash    Medications: Prior to Admission medications   Medication Sig Start Date End Date Taking? Authorizing Provider  allopurinol (ZYLOPRIM) 100 MG tablet Take 100 mg by mouth 2 (two) times daily. 11/27/18  Yes  [provider]  amLODipine (NORVASC) 10 MG tablet Take 10 mg by mouth daily.   Yes [provider]  atenolol (TENORMIN) 100 MG tablet Take 100 mg by mouth daily.   Yes [provider]  atorvastatin (LIPITOR) 40 MG tablet Take 40 mg by mouth daily.    Yes [provider]  calcitRIOL (ROCALTROL) 0.25 MCG capsule Take 0.25 mcg by mouth daily. 12/18/18  Yes [provider]  Cholecalciferol (VITAMIN D-3) 125 MCG (5000 UT) TABS Take 5,000 mg by mouth daily.   Yes [provider]  ferrous sulfate 325 (65 FE) MG tablet Take 325 mg by mouth daily with breakfast.   Yes [provider]  furosemide (LASIX) 40 MG tablet Take 40 mg by mouth 2 (two) times daily.  11/29/19  Yes [provider]  levETIRAcetam (KEPPRA) 500 MG tablet Take 500 mg by mouth 2 (two) times daily. 12/08/18  Yes [provider]  traMADol (ULTRAM) 50 MG tablet Take 1-2 tablets (50-100 mg total) by mouth every 6 (six) hours as needed for moderate pain or severe pain. 11/10/20  Yes Dancy, Estill Bamberg, PA-C    I have reviewed the patient's current and reported prior to admission medications.  Labs:  BMP Latest Ref Rng & Units 11/11/2020 11/10/2020 11/09/2020  Glucose 70 - 99 mg/dL 207(H) 133(H) 96  BUN 6 - 20 mg/dL 61(H) 57(H) 55(H)  Creatinine 0.61 - 1.24 mg/dL 7.38(H) 7.28(H) 6.86(H)  Sodium 135 - 145 mmol/L 136 142 142  Potassium 3.5 - 5.1 mmol/L 4.7 4.5 4.2  Chloride 98 - 111 mmol/L 102 105 105  CO2 22 - 32 mmol/L 19(L) 24 24  Calcium 8.9 - 10.3 mg/dL 9.0 9.2 9.8    ROS:  Pertinent items noted in HPI and remainder of comprehensive ROS otherwise negative.  Physical Exam: Vitals:   11/11/20 0919 11/11/20 1432  BP: (!) 157/94 136/89  Pulse: 65 64  Resp:  16  Temp:  98.2 F (36.8 C)  SpO2:  97%     General:  Adult male in bed in discomfort that he relates to surgery HEENT: Normocephalic atraumatic Eyes: Extraocular movements intact sclera  anicteric Neck: Supple trachea midline Heart: S1-S2 no rub appreciated Lungs: Clear to auscultation bilaterally normal work of breathing at rest on room air Abdomen: Soft obese habitus slightly tender consistent with postop Extremities: No pitting edema Skin: No rash on extremities exposed Neuro: Alert and oriented x3 provides a history and follows commands Psych normal mood and affect Left upper extremity AV fistula with weak bruit  Assessment/Plan:  # 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 - He is being transferred to [facility] - Have ordered Hep B surface antigen  - HD SW is aware of the patient and is looking for a dialysis unit  -  He does have an AVF in place which I am hoping will work  - renal panel in AM - If somehow an outpatient dialysis spot becomes available before that it [facility] he is able to go home at that time - lasix once now and in AM  # HTN  - Continue home meds - lasix IV once  # Anemia CKD - Mild no acute need for ESA and note maligancy  # left renal mass - s/p left radical nephrectomy - pain control per primary team  - would avoid morphine or NSAID's  # metabolic bone disease - phos in am   If somehow an outpatient dialysis spot becomes available before he gets a bed at [facility] he is able to go home at that time.   Claudia Desanctis 11/11/2020,6:00 PM

## 2020-11-11 NOTE — Progress Notes (Signed)
Urology Inpatient Progress Report  Left renal mass [N28.89]  Procedure(s): LEFT LAPAROSCOPIC RADICAL NEPHRECTOMY  1 Day Post-Op   Intv/Subj: No acute events overnight. Patient is complaining of abdominal pain diffusely and nausea. He has not eaten anything, not really drinking either.  No flatus He has not walked.  Active Problems:   Left renal mass  Current Facility-Administered Medications  Medication Dose Route Frequency Provider Last Rate Last Admin  . acetaminophen (OFIRMEV) IV 1,000 mg  1,000 mg Intravenous Q6H Nikas, Chrstine, MD 400 mL/hr at 11/11/20 0644 1,000 mg at 11/11/20 0644  . allopurinol (ZYLOPRIM) tablet 100 mg  100 mg Oral BID Nikas, Chrstine, MD      . amLODipine (NORVASC) tablet 10 mg  10 mg Oral Daily Nikas, Chrstine, MD      . atenolol (TENORMIN) tablet 100 mg  100 mg Oral Daily Nikas, Chrstine, MD      . calcitRIOL (ROCALTROL) capsule 0.25 mcg  0.25 mcg Oral Daily Nikas, Chrstine, MD      . dextrose 5 %-0.45 % sodium chloride infusion   Intravenous Continuous Everlena Cooper, Chrstine, MD 150 mL/hr at 11/10/20 1819 New Bag at 11/10/20 1819  . diphenhydrAMINE (BENADRYL) injection 12.5 mg  12.5 mg Intravenous Q6H PRN Nikas, Chrstine, MD       Or  . diphenhydrAMINE (BENADRYL) 12.5 MG/5ML elixir 12.5 mg  12.5 mg Oral Q6H PRN Nikas, Chrstine, MD      . docusate sodium (COLACE) capsule 100 mg  100 mg Oral BID Everlena Cooper, Chrstine, MD   100 mg at 11/10/20 2203  . furosemide (LASIX) tablet 40 mg  40 mg Oral BID Everlena Cooper, Chrstine, MD      . levETIRAcetam (KEPPRA) tablet 500 mg  500 mg Oral BID Everlena Cooper, Chrstine, MD   500 mg at 11/10/20 2203  . MEDLINE mouth rinse  15 mL Mouth Rinse BID Ardis Hughs, MD      . morphine 4 MG/ML injection 2 mg  2 mg Intravenous Q4H PRN Delight Hoh, MD   2 mg at 11/11/20 0207  . ondansetron (ZOFRAN) injection 4 mg  4 mg Intravenous Q4H PRN Nikas, Chrstine, MD      . oxyCODONE (Oxy IR/ROXICODONE) immediate release tablet 5 mg  5 mg Oral Q4H PRN  Everlena Cooper, Chrstine, MD         Objective: Vital: Vitals:   11/10/20 1839 11/10/20 2007 11/11/20 0641 11/11/20 0642  BP:  114/81 (!) 152/96 (!) 152/89  Pulse:  67 64 68  Resp:    19  Temp:  97.7 F (36.5 C) 98.1 F (36.7 C) 98.1 F (36.7 C)  TempSrc:  Oral    SpO2:  97%  98%  Weight: 100.2 kg     Height: 5\' 6"  (1.676 m)      I/Os: I/O last 3 completed shifts: In: 52.1 [P.O.:25; I.V.:27.1] Out: 390 [Urine:150; Blood:240]  Physical Exam:  General: Patient is in no apparent distress Lungs: Normal respiratory effort, chest expands symmetrically. GI: Incisions are c/d/i. Abdomen is slightly distended and tender. Foley: scant, clear urine  Ext: lower extremities symmetric  Lab Results: Recent Labs    11/09/20 1316 11/10/20 1738 11/11/20 0048  WBC 12.5*  --   --   HGB 12.9* 12.0* 11.4*  HCT 39.8 36.8* 35.1*   Recent Labs    11/09/20 1316 11/10/20 1738 11/11/20 0048  NA 142 142 136  K 4.2 4.5 4.7  CL 105 105 102  CO2 24 24 19*  GLUCOSE 96 133* 207*  BUN 55* 57* 61*  CREATININE 6.86* 7.28* 7.38*  CALCIUM 9.8 9.2 9.0   No results for input(s): LABPT, INR in the last 72 hours. No results for input(s): LABURIN in the last 72 hours. Results for orders placed or performed during the hospital encounter of 11/09/20  SARS CORONAVIRUS 2 (TAT 6-24 HRS) Nasopharyngeal Nasopharyngeal Swab     Status: None   Collection Time: 11/09/20  5:48 PM   Specimen: Nasopharyngeal Swab  Result Value Ref Range Status   SARS Coronavirus 2 NEGATIVE NEGATIVE Final    Comment: (NOTE) SARS-CoV-2 target nucleic acids are NOT DETECTED.  The SARS-CoV-2 RNA is generally detectable in upper and lower respiratory specimens during the acute phase of infection. Negative results do not preclude SARS-CoV-2 infection, do not rule out co-infections with other pathogens, and should not be used as the sole basis for treatment or other patient management decisions. Negative results must be combined with  clinical observations, patient history, and epidemiological information. The expected result is Negative.  Fact Sheet for Patients: SugarRoll.be  Fact Sheet for Healthcare Providers: https://www.woods-mathews.com/  This test is not yet approved or cleared by the [place] FDA and  has been authorized for detection and/or diagnosis of SARS-CoV-2 by FDA under an Emergency Use Authorization (EUA). This EUA will remain  in effect (meaning this test can be used) for the duration of the COVID-19 declaration under Se ction 564(b)(1) of the Act, 21 U.S.C. section 360bbb-3(b)(1), unless the authorization is terminated or revoked sooner.  Performed at [facility], [place] [address] [place], [place] [address]     Studies/Results: No results found.  Assessment: Procedure(s): LEFT LAPAROSCOPIC RADICAL NEPHRECTOMY, 1 Day Post-Op  With poor pain control, otherwise stable and doing well.  Plan: IV tylenol for pain, transition to oral meds D/c foley HLIVF Ambulate and slowly advance diet Transfer to [facility] for HD.   Louis Meckel, MD Urology 11/11/2020, 7:19 AM

## 2020-11-12 LAB — RENAL FUNCTION PANEL
Albumin: 3.8 g/dL (ref 3.5–5.0)
Anion gap: 15 (ref 5–15)
BUN: 78 mg/dL — ABNORMAL HIGH (ref 6–20)
CO2: 18 mmol/L — ABNORMAL LOW (ref 22–32)
Calcium: 9.1 mg/dL (ref 8.9–10.3)
Chloride: 102 mmol/L (ref 98–111)
Creatinine, Ser: 9.61 mg/dL — ABNORMAL HIGH (ref 0.61–1.24)
GFR, Estimated: 6 mL/min — ABNORMAL LOW (ref >60)
Glucose, Bld: 95 mg/dL (ref 70–99)
Phosphorus: 5.8 mg/dL — ABNORMAL HIGH (ref 2.5–4.6)
Potassium: 4.3 mmol/L (ref 3.5–5.1)
Sodium: 135 mmol/L (ref 135–145)

## 2020-11-12 LAB — HEMOGLOBIN AND HEMATOCRIT, BLOOD
HCT: 34.1 % — ABNORMAL LOW (ref 39.0–52.0)
Hemoglobin: 11 g/dL — ABNORMAL LOW (ref 13.0–17.0)

## 2020-11-12 LAB — HEPATITIS B SURFACE ANTIGEN: Hepatitis B Surface Ag: NONREACTIVE

## 2020-11-12 MED ORDER — ONDANSETRON 4 MG PO TBDP: 4.0000 mg | ORAL_TABLET | Freq: Three times a day (TID) | ORAL | Status: DC | PRN

## 2020-11-12 MED ORDER — ACETAMINOPHEN 500 MG PO TABS
1000.0000 mg | ORAL_TABLET | Freq: Four times a day (QID) | ORAL | Status: DC
  Administered 2020-11-12 – 2020-11-14 (×9): 1000 mg via ORAL
  Filled 2020-11-12 (×9): qty 2

## 2020-11-12 MED ORDER — CHLORHEXIDINE GLUCONATE CLOTH 2 % EX PADS
6.0000 | MEDICATED_PAD | Freq: Every day | CUTANEOUS | Status: DC
  Administered 2020-11-13 – 2020-11-14 (×2): 6 via TOPICAL

## 2020-11-12 NOTE — Progress Notes (Addendum)
Urology Inpatient Progress Report  Left renal mass [N28.89]  Procedure(s): LEFT LAPAROSCOPIC RADICAL NEPHRECTOMY  2 Days Post-Op   Intv/Subj: No acute events overnight. Pain is well controlled this morning. Ambulated yesterday. Voiding spontaneously since foley removed. Tolerating diet. Ready to order breakfast this morning.   Active Problems:   Left renal mass  Current Facility-Administered Medications  Medication Dose Route Frequency Provider Last Rate Last Admin   allopurinol (ZYLOPRIM) tablet 100 mg  100 mg Oral BID Everlena Cooper, Chrstine, MD   100 mg at 11/12/20 0907   amLODipine (NORVASC) tablet 10 mg  10 mg Oral Daily Nikas, Chrstine, MD   10 mg at 11/12/20 0907   atenolol (TENORMIN) tablet 100 mg  100 mg Oral Daily Nikas, Chrstine, MD   100 mg at 11/12/20 0947   calcitRIOL (ROCALTROL) capsule 0.25 mcg  0.25 mcg Oral Daily Everlena Cooper, Chrstine, MD   0.25 mcg at 11/12/20 0962   diphenhydrAMINE (BENADRYL) injection 12.5 mg  12.5 mg Intravenous Q6H PRN Nikas, Chrstine, MD       Or   diphenhydrAMINE (BENADRYL) 12.5 MG/5ML elixir 12.5 mg  12.5 mg Oral Q6H PRN Nikas, Chrstine, MD       docusate sodium (COLACE) capsule 100 mg  100 mg Oral BID Everlena Cooper, Chrstine, MD   100 mg at 11/12/20 0907   levETIRAcetam (KEPPRA) tablet 500 mg  500 mg Oral BID Everlena Cooper, Chrstine, MD   500 mg at 11/12/20 8366   MEDLINE mouth rinse  15 mL Mouth Rinse BID Ardis Hughs, MD   15 mL at 11/12/20 0912   ondansetron (ZOFRAN) injection 4 mg  4 mg Intravenous Q4H PRN Everlena Cooper, Chrstine, MD   4 mg at 11/12/20 2947   oxyCODONE (Oxy IR/ROXICODONE) immediate release tablet 5-10 mg  5-10 mg Oral Q4H PRN Ardis Hughs, MD   10 mg at 11/12/20 6546     Objective: Vital: Vitals:   11/11/20 2106 11/12/20 0509 11/12/20 0906 11/12/20 0907  BP: (!) 155/95 (!) 135/97 (!) 148/89 (!) 149/89  Pulse: 68 69 75   Resp: 16 16    Temp: 97.7 F (36.5 C) 98.1 F (36.7 C)    TempSrc: Oral Oral    SpO2: 94% 92%     Weight:      Height:       I/Os: I/O last 3 completed shifts: In: 100 [IV Piggyback:100] Out: 1100 [Urine:1100]  Physical Exam:  General: Patient is in no apparent distress Lungs: Normal respiratory effort, chest expands symmetrically. GI: Incisions are c/d/i. Abdomen is slightly distended, non-tender. Ext: lower extremities symmetric  Lab Results: Recent Labs    11/09/20 1316 11/10/20 1738 11/11/20 0048 11/12/20 0841  WBC 12.5*  --   --   --   HGB 12.9* 12.0* 11.4* 11.0*  HCT 39.8 36.8* 35.1* 34.1*   Recent Labs    11/09/20 1316 11/10/20 1738 11/11/20 0048  NA 142 142 136  K 4.2 4.5 4.7  CL 105 105 102  CO2 24 24 19*  GLUCOSE 96 133* 207*  BUN 55* 57* 61*  CREATININE 6.86* 7.28* 7.38*  CALCIUM 9.8 9.2 9.0   No results for input(s): LABPT, INR in the last 72 hours. No results for input(s): LABURIN in the last 72 hours. Results for orders placed or performed during the hospital encounter of 11/09/20  SARS CORONAVIRUS 2 (TAT 6-24 HRS) Nasopharyngeal Nasopharyngeal Swab     Status: None   Collection Time: 11/09/20  5:48 PM   Specimen: Nasopharyngeal Swab  Result Value Ref Range Status   SARS Coronavirus 2 NEGATIVE NEGATIVE Final    Comment: (NOTE) SARS-CoV-2 target nucleic acids are NOT DETECTED.  The SARS-CoV-2 RNA is generally detectable in upper and lower respiratory specimens during the acute phase of infection. Negative results do not preclude SARS-CoV-2 infection, do not rule out co-infections with other pathogens, and should not be used as the sole basis for treatment or other patient management decisions. Negative results must be combined with clinical observations, patient history, and epidemiological information. The expected result is Negative.  Fact Sheet for Patients: SugarRoll.be  Fact Sheet for Healthcare Providers: https://www.woods-mathews.com/  This test is not yet approved or cleared by the  [place] FDA and  has been authorized for detection and/or diagnosis of SARS-CoV-2 by FDA under an Emergency Use Authorization (EUA). This EUA will remain  in effect (meaning this test can be used) for the duration of the COVID-19 declaration under Se ction 564(b)(1) of the Act, 21 U.S.C. section 360bbb-3(b)(1), unless the authorization is terminated or revoked sooner.  Performed at [facility], [place] [address], [place], [place] [address]     Studies/Results: No results found.  Assessment: Procedure(s): LEFT LAPAROSCOPIC RADICAL NEPHRECTOMY, 2 Days Post-Op  With poor pain control, otherwise stable and doing well.  Plan: - Still awaiting bed at [facility] for HD. Per nephrology, patient can discharge to home if outpatient dialysis chair becomes available prior to transfer to [facility]. Patient ready for discharge from a post operative perspective - Continue sched tylenol, prn oxycodone, bowel regimen - Regular diet - Ambulate daily  I have seen and examined the patient and agree with the above assessment and plan.  Pain controlled. No nausea or emesis. Tolerating diet. Afebrile. 1L UOP. Cr 9.6 from 7.3. K normal at 4.3.  -Awaiting bed availability at [facility] for HD.  -If outpatient dialysis becomes available prior to transfer, ok to discharge from nephrology standpoint -Stable for discharge once dialysis is arranged from GU standpoint -Diet as tolerated  [facility]  Pager: [phone]

## 2020-11-12 NOTE — Progress Notes (Signed)
[facility] Progress Note  Name: Logan Decker MRN: [id number] DOB: 08/20/1961  Chief Complaint:  Kidney mass  Subjective:  Still at [facility].  He doesn't have outpatient HD unit yet and per RN he now has a bed at [facility].  He had 1 liter UOP over 1/22 charted and 1 urine void.  States they told him AVF was good to use and this is c/w prior note.  Review of systems:  Denies shortness of breath or chest pain  nausea yesterday but better today; no vomiting Good appetite today   Intake/Output Summary (Last 24 hours) at 11/12/2020 1440 Last data filed at 11/12/2020 2263 Gross per 24 hour  Intake --  Output 1300 ml  Net -1300 ml    Vitals:  Vitals:   11/12/20 0509 11/12/20 0906 11/12/20 0907 11/12/20 1301  BP: (!) 135/97 (!) 148/89 (!) 149/89 137/84  Pulse: 69 75  70  Resp: 16   16  Temp: 98.1 F (36.7 C)   97.6 F (36.4 C)  TempSrc: Oral   Oral  SpO2: 92%   96%  Weight:      Height:         Physical Exam:  General:  Adult male in bed in discomfort that he relates to surgery HEENT: Normocephalic atraumatic Eyes: Extraocular movements intact sclera anicteric Neck: Supple trachea midline Heart: S1-S2 no rub appreciated Lungs: Clear to auscultation bilaterally normal work of breathing at rest on room air Abdomen: Soft obese habitus slightly tender consistent with postop Extremities: No pitting edema Skin: No rash on extremities exposed Neuro: Alert and oriented x3 provides a history and follows commands Psych normal mood and affect Left upper extremity AV fistula with weak bruit  Medications reviewed   Labs:  BMP Latest Ref Rng & Units 11/12/2020 11/11/2020 11/10/2020  Glucose 70 - 99 mg/dL 95 207(H) 133(H)  BUN 6 - 20 mg/dL 78(H) 61(H) 57(H)  Creatinine 0.61 - 1.24 mg/dL 9.61(H) 7.38(H) 7.28(H)  Sodium 135 - 145 mmol/L 135 136 142  Potassium 3.5 - 5.1 mmol/L 4.3 4.7 4.5  Chloride 98 - 111 mmol/L 102 102 105  CO2 22 - 32 mmol/L 18(L) 19(L) 24  Calcium  8.9 - 10.3 mg/dL 9.1 9.0 9.2     Assessment/Plan:   # 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 - He is being transferred to [facility] - HD SW is aware of the patient and is looking for an outpatient dialysis unit  - He does have an AVF in place which I am hoping will work  - HD on 1/24 as emergent staffing permits - We are waiting on an outpatient HD unit  # HTN  - Continue home meds  # Anemia CKD - Mild no acute need for ESA and note maligancy  # 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   Claudia Desanctis, MD 11/12/2020 2:56 PM

## 2020-11-12 NOTE — Progress Notes (Signed)
Report called to [facility]. Room 11 Report called to [facility] RN. We will need order to for transfer. Cont with plan of care

## 2020-11-12 NOTE — Progress Notes (Signed)
Spoke with on call doctor. She stated that Dr. Louis Meckel would be they transferring and receiving MD for this patient at this time.

## 2020-11-13 DIAGNOSIS — N2889 Other specified disorders of kidney and ureter: Secondary | ICD-10-CM

## 2020-11-13 DIAGNOSIS — E785 Hyperlipidemia, unspecified: Secondary | ICD-10-CM | POA: Diagnosis present

## 2020-11-13 DIAGNOSIS — N186 End stage renal disease: Secondary | ICD-10-CM | POA: Diagnosis present

## 2020-11-13 LAB — RENAL FUNCTION PANEL
Albumin: 3 g/dL — ABNORMAL LOW (ref 3.5–5.0)
Anion gap: 15 (ref 5–15)
BUN: 86 mg/dL — ABNORMAL HIGH (ref 6–20)
CO2: 20 mmol/L — ABNORMAL LOW (ref 22–32)
Calcium: 9.1 mg/dL (ref 8.9–10.3)
Chloride: 101 mmol/L (ref 98–111)
Creatinine, Ser: 11.67 mg/dL — ABNORMAL HIGH (ref 0.61–1.24)
GFR, Estimated: 5 mL/min — ABNORMAL LOW (ref >60)
Glucose, Bld: 93 mg/dL (ref 70–99)
Phosphorus: 8.5 mg/dL — ABNORMAL HIGH (ref 2.5–4.6)
Potassium: 4.2 mmol/L (ref 3.5–5.1)
Sodium: 136 mmol/L (ref 135–145)

## 2020-11-13 MED ORDER — BISACODYL 10 MG RE SUPP: 10.0000 mg | Freq: Every day | RECTAL | Status: DC | PRN

## 2020-11-13 MED ORDER — SENNA 8.6 MG PO TABS
1.0000 | ORAL_TABLET | Freq: Every day | ORAL | Status: DC
  Administered 2020-11-13 – 2020-11-14 (×2): 8.6 mg via ORAL
  Filled 2020-11-13 (×2): qty 1

## 2020-11-13 NOTE — Progress Notes (Signed)
Urology Inpatient Progress Report  Left renal mass [N28.89]  Procedure(s): LEFT LAPAROSCOPIC RADICAL NEPHRECTOMY  3 Days Post-Op   Intv/Subj: Patient transferred to [facility] last night. Feeling very well this morning. Denies pain. Unfortunately AVF clotted, unable to undergo HD this morning. Otherwise no complaints, in good spirits.   1.1L urine output  Active Problems:   Left renal mass  Current Facility-Administered Medications  Medication Dose Route Frequency Provider Last Rate Last Admin  . acetaminophen (TYLENOL) tablet 1,000 mg  1,000 mg Oral Q6H Khalel Alms, MD   1,000 mg at 11/13/20 0554  . allopurinol (ZYLOPRIM) tablet 100 mg  100 mg Oral BID Everlena Cooper, Kashius Dominic, MD   100 mg at 11/12/20 2209  . amLODipine (NORVASC) tablet 10 mg  10 mg Oral Daily Everlena Cooper, Nasri Boakye, MD   10 mg at 11/12/20 0907  . atenolol (TENORMIN) tablet 100 mg  100 mg Oral Daily Avriana Joo, MD   100 mg at 11/12/20 0907  . calcitRIOL (ROCALTROL) capsule 0.25 mcg  0.25 mcg Oral Daily Everlena Cooper, Develle Sievers, MD   0.25 mcg at 11/12/20 0907  . Chlorhexidine Gluconate Cloth 2 % PADS 6 each  6 each Topical Q0600 Claudia Desanctis, MD   6 each at 11/13/20 0602  . diphenhydrAMINE (BENADRYL) injection 12.5 mg  12.5 mg Intravenous Q6H PRN Quinterrius Errington, MD       Or  . diphenhydrAMINE (BENADRYL) 12.5 MG/5ML elixir 12.5 mg  12.5 mg Oral Q6H PRN Qualyn Oyervides, MD      . docusate sodium (COLACE) capsule 100 mg  100 mg Oral BID Everlena Cooper, Danecia Underdown, MD   100 mg at 11/12/20 2208  . levETIRAcetam (KEPPRA) tablet 500 mg  500 mg Oral BID Everlena Cooper, Izaias Krupka, MD   500 mg at 11/12/20 2208  . MEDLINE mouth rinse  15 mL Mouth Rinse BID Ardis Hughs, MD   15 mL at 11/12/20 2214  . ondansetron (ZOFRAN-ODT) disintegrating tablet 4 mg  4 mg Oral Q8H PRN Larenda Reedy, MD      . oxyCODONE (Oxy IR/ROXICODONE) immediate release tablet 5-10 mg  5-10 mg Oral Q4H PRN Ardis Hughs, MD   10 mg at 11/12/20 2211      Objective: Vital: Vitals:   11/12/20 1956 11/12/20 2317 11/13/20 0530 11/13/20 0806  BP: (!) 168/108 106/70 126/82 (!) 146/80  Pulse: 75 63 71 70  Resp:  20 20 18   Temp: 98.5 F (36.9 C) (!) 97.5 F (36.4 C) 98 F (36.7 C) (!) 97.3 F (36.3 C)  TempSrc: Oral Oral Oral Oral  SpO2: 94% 95% 94% 98%  Weight:      Height:       I/Os: I/O last 3 completed shifts: In: 340 [P.O.:340] Out: 1900 [Urine:1900]  Physical Exam:  General: Patient is in no apparent distress Lungs: Normal respiratory effort, chest expands symmetrically. GI: Incisions are c/d/i. Abdomen is slightly distended, non-tender. Ext: lower extremities symmetric  Lab Results: Recent Labs    11/10/20 1738 11/11/20 0048 11/12/20 0841  HGB 12.0* 11.4* 11.0*  HCT 36.8* 35.1* 34.1*   Recent Labs    11/10/20 1738 11/11/20 0048 11/12/20 0841  NA 142 136 135  K 4.5 4.7 4.3  CL 105 102 102  CO2 24 19* 18*  GLUCOSE 133* 207* 95  BUN 57* 61* 78*  CREATININE 7.28* 7.38* 9.61*  CALCIUM 9.2 9.0 9.1   No results for input(s): LABPT, INR in the last 72 hours. No results for input(s): LABURIN in the last 72 hours.  Results for orders placed or performed during the hospital encounter of 11/09/20  SARS CORONAVIRUS 2 (TAT 6-24 HRS) Nasopharyngeal Nasopharyngeal Swab     Status: None   Collection Time: 11/09/20  5:48 PM   Specimen: Nasopharyngeal Swab  Result Value Ref Range Status   SARS Coronavirus 2 NEGATIVE NEGATIVE Final    Comment: (NOTE) SARS-CoV-2 target nucleic acids are NOT DETECTED.  The SARS-CoV-2 RNA is generally detectable in upper and lower respiratory specimens during the acute phase of infection. Negative results do not preclude SARS-CoV-2 infection, do not rule out co-infections with other pathogens, and should not be used as the sole basis for treatment or other patient management decisions. Negative results must be combined with clinical observations, patient history, and  epidemiological information. The expected result is Negative.  Fact Sheet for Patients: SugarRoll.be  Fact Sheet for Healthcare Providers: https://www.woods-mathews.com/  This test is not yet approved or cleared by the [place] FDA and  has been authorized for detection and/or diagnosis of SARS-CoV-2 by FDA under an Emergency Use Authorization (EUA). This EUA will remain  in effect (meaning this test can be used) for the duration of the COVID-19 declaration under Se ction 564(b)(1) of the Act, 21 U.S.C. section 360bbb-3(b)(1), unless the authorization is terminated or revoked sooner.  Performed at [facility], [place] [address], [place] [employer], [place] [address]     Studies/Results: No results found.  Assessment: Procedure(s): LEFT LAPAROSCOPIC RADICAL NEPHRECTOMY, 3 Days Post-Op  With poor pain control, otherwise stable and doing well.  Plan: - IR consulted today for possible tunneled cath placement - Continue sched tylenol, prn oxycodone, bowel regimen. Add prn suppository - Regular diet - Ambulate daily - Stable for discharge from a post op perspective. Nephrology managing dialysis plans, will require inpatient dialysis prior to discharge

## 2020-11-13 NOTE — Progress Notes (Signed)
Report given by dialysis nurse that patient will be returned back to unit d/t AVF clotted. Patient returned via bed, Alert and in stable condition.

## 2020-11-13 NOTE — Progress Notes (Signed)
NURSING PROGRESS NOTE  Logan Decker [id number] Admission Data: 11/13/2020 5:15 AM Attending Provider: Ardis Hughs, MD HVF:MBBUY, Eulogio Ditch, PA-C Code Status: Full  Logan Decker is a 60 y.o. male patient admitted from ED:  -No acute distress noted.  -No complaints of shortness of breath.  -No complaints of chest pain.   Cardiac Monitoring: N/A  Blood pressure 106/70, pulse 63, temperature (!) 97.5 F (36.4 C), temperature source Oral, resp. rate 20, height 5\' 6"  (1.676 m), weight 100.2 kg, SpO2 95 %.   IV Fluids:  IV in place, occlusive dsg intact without redness, IV cath right AC and right UA, condition patent and no redness none.   Allergies:  Ace inhibitors and Zonisamide  Past Medical History:   has a past medical history of Arthritis, Brain aneurysm, CKD (chronic kidney disease) stage 4, GFR 15-29 ml/min ([place]), Gout, High cholesterol, Hypertension, Seizures ([place]), and Wears dentures.  Past Surgical History:   has a past surgical history that includes Amputation finger (Right, 08/08/2017); Cerebral aneurysm repair (2003); Varicose vein surgery (Bilateral); Amputation (Right, 08/08/2017); Robotic assited partial nephrectomy (Right, 12/05/2017); Multiple tooth extractions; Colonoscopy; AV fistula placement (Left, 03/23/2020); Fistula superficialization (Left, 05/30/2020); and Laparoscopic nephrectomy (Left, 11/10/2020).  Social History:   reports that he has been smoking cigarettes. He has smoked for the past 35.00 years. He has never used smokeless tobacco. He reports that he does not drink alcohol and does not use drugs.  Skin: Clean, dry and intact. 3 abdominal surgical incision sites with skin glue closure  Patient/Family orientated to room. Information packet given to patient/family. Admission inpatient armband information verified with patient/family to include name and date of birth and placed on patient arm. Side rails up x 2, fall assessment and education completed with  patient/family. Patient/family able to verbalize understanding of risk associated with falls and verbalized understanding to call for assistance before getting out of bed. Call light within reach. Patient/family able to voice and demonstrate understanding of unit orientation instructions.    Will continue to evaluate and treat per MD orders.

## 2020-11-13 NOTE — Progress Notes (Signed)
Patient has been accepted for outpatient HD treatment at [facility] on a TTS schedule with a seat time of 11:30am. He needs to arrive to his appointments at 11:10am.  On his first appointment, he needs to arrive at 10:30am to complete intake paperwork. I have arranged for Thursday 11/16/20 to be patient's first outpatient HD appointment as I see that he needs attention to his access prior to discharge and hope that he will be ready for discharge by 11/15/20. Patient aware and asked to schedule his Medicaid transportation for HD appointment on 11/16/20. He agrees. This can be canceled if plan falls through. Clinic updated.   Alphonzo Cruise, [place] [facility]igator [phone]

## 2020-11-13 NOTE — Progress Notes (Signed)
[facility] Progress Note  Name: Logan Decker MRN: [id number] DOB: 1961/04/05  Chief Complaint:  Kidney mass  Subjective: AVF clotted L arm, could not to HD this am.  No SOB, cough or jerking/ confusion. No n/v    Intake/Output Summary (Last 24 hours) at 11/13/2020 0747 Last data filed at 11/13/2020 0600 Gross per 24 hour  Intake 340 ml  Output 1100 ml  Net -760 ml    Vitals:  Vitals:   11/12/20 1833 11/12/20 1956 11/12/20 2317 11/13/20 0530  BP:  (!) 168/108 106/70 126/82  Pulse:  75 63 71  Resp:   20 20  Temp: 98.7 F (37.1 C) 98.5 F (36.9 C) (!) 97.5 F (36.4 C) 98 F (36.7 C)  TempSrc: Oral Oral Oral Oral  SpO2:  94% 95% 94%  Weight:      Height:         Physical Exam:   alert, nad   no jvd  Chest cta bilat  Cor reg no RG  Abd soft ntnd no ascites   Ext no LE edema   Alert, NF, ox3    LUA AVF no bruit   Medications reviewed   Labs:  BMP Latest Ref Rng & Units 11/12/2020 11/11/2020 11/10/2020  Glucose 70 - 99 mg/dL 95 207(H) 133(H)  BUN 6 - 20 mg/dL 78(H) 61(H) 57(H)  Creatinine 0.61 - 1.24 mg/dL 9.61(H) 7.38(H) 7.28(H)  Sodium 135 - 145 mmol/L 135 136 142  Potassium 3.5 - 5.1 mmol/L 4.3 4.7 4.5  Chloride 98 - 111 mmol/L 102 102 105  CO2 22 - 32 mmol/L 18(L) 19(L) 24  Calcium 8.9 - 10.3 mg/dL 9.1 9.0 9.2     Assessment/Plan:   # 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   Sol Blazing, MD 11/13/2020 7:47 AM

## 2020-11-13 NOTE — Consult Note (Addendum)
Chief Complaint: Patient was seen in consultation today for malfunctioning left upper arm fistula  Referring Physician(s): Dr. Jonnie Finner  Supervising Physician: Corrie Mckusick  Patient Status: [facility] - In-pt  History of Present Illness: Logan Decker is a 60 y.o. 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 He had a fistula created 05/2020. 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 He is s/p right partial nephrectomy 12/05/17 and left radical nephrectomy 11/10/20. He was scheduled for dialysis today but the HD team found his left upper arm fistula to be clotted.   Interventional Radiology has been asked to evaluate this patient for an image-guided left upper arm fistulogram with possible intervention.   Past Medical History:  Diagnosis Date  . Arthritis    "knees" (08/08/2017)  . Brain aneurysm    ruptured brain aneurysm, treated surgically, was very sick, 01-29-02, "almost died"  . CKD (chronic kidney disease) stage 4, GFR 15-29 ml/min (HCC)   . Gout   . High cholesterol   . Hypertension   . Seizures ([place])    "because of scaring on brain tissue after OR for ruptured aneursym; on daily RX; last sz was in 07/2017" (08/08/2017)  . Wears dentures     Past Surgical History:  Procedure Laterality Date  . AMPUTATION Right 08/08/2017   Procedure: AMPUTATION right tumb;  Surgeon: Milly Jakob, MD;  Location: [place];  Service: Orthopedics;  Laterality: Right;  . AMPUTATION FINGER Right 08/08/2017   thumb  . AV FISTULA PLACEMENT Left 03/23/2020   Procedure: LEFT ARM ARTERIOVENOUS (AV) FISTULA CREATION;  Surgeon: Waynetta Sandy, MD;  Location: [place];  Service: Vascular;  Laterality: Left;  . CEREBRAL ANEURYSM REPAIR  January 29, 2002   [employer]  . COLONOSCOPY    . FISTULA SUPERFICIALIZATION Left 05/30/2020   Procedure: LEFT BRACHIOCEPHALIC Transposition of FISTULA .;  Surgeon: Waynetta Sandy, MD;  Location: [place];  Service: Vascular;  Laterality: Left;  . LAPAROSCOPIC NEPHRECTOMY Left 11/10/2020   Procedure: LEFT LAPAROSCOPIC RADICAL NEPHRECTOMY;  Surgeon: Ardis Hughs, MD;  [facility];  Service: Urology;  Laterality: Left;  [name] MULTIPLE TOOTH EXTRACTIONS    . ROBOTIC ASSITED PARTIAL NEPHRECTOMY Right 12/05/2017   Procedure: XI ROBOTIC ASSITED LAPAROSCOPIC RIGHT PARTIAL NEPHRECTOMY;  Surgeon: Ardis Hughs, MD;  [facility];  Service: Urology;  Laterality: Right;  [name] VARICOSE VEIN SURGERY Bilateral    legs    Allergies: Ace inhibitors and Zonisamide  Medications: Prior to Admission medications   Medication Sig Start Date End Date Taking? Authorizing Provider  allopurinol (ZYLOPRIM) 100 MG tablet Take 100 mg by mouth 2 (two) times daily. 11/27/18  Yes [provider]  amLODipine (NORVASC) 10 MG tablet Take 10 mg by mouth daily.   Yes [provider]  atenolol (TENORMIN) 100 MG tablet Take 100 mg by mouth daily.   Yes [provider]  atorvastatin (LIPITOR) 40 MG tablet Take 40 mg by mouth daily.    Yes [provider]  calcitRIOL (ROCALTROL) 0.25 MCG capsule Take 0.25 mcg by mouth daily. 12/18/18  Yes [provider]  Cholecalciferol (VITAMIN D-3) 125 MCG (5000 UT) TABS Take 5,000 mg by mouth daily.   Yes [provider]  ferrous sulfate 325 (65 FE) MG tablet Take 325 mg by mouth daily with breakfast.   Yes [provider]  furosemide (LASIX) 40 MG tablet Take  40 mg by mouth 2 (two) times daily.  11/29/19  Yes [provider]  levETIRAcetam (KEPPRA) 500 MG tablet Take 500 mg by mouth 2 (two) times daily. 12/08/18  Yes [provider]  traMADol (ULTRAM) 50 MG tablet Take 1-2 tablets (50-100 mg total) by mouth every 6 (six) hours as needed for moderate pain or severe pain. 11/10/20  Yes Debbrah Alar, PA-C     History reviewed. No pertinent family history.  Social  History   Socioeconomic History  . Marital status: Divorced    Spouse name: Not on file  . Number of children: Not on file  . Years of education: Not on file  . Highest education level: Not on file  Occupational History  . Not on file  Tobacco Use  . Smoking status: Current Some Day Smoker    Years: 35.00    Types: Cigarettes  . Smokeless tobacco: Never Used  Vaping Use  . Vaping Use: Never used  Substance and Sexual Activity  . Alcohol use: No  . Drug use: No  . Sexual activity: Never  Other Topics Concern  . Not on file  Social History Narrative  . Not on file   Social Determinants of Health   Financial Resource Strain: Not on file  Food Insecurity: Not on file  Transportation Needs: Not on file  Physical Activity: Not on file  Stress: Not on file  Social Connections: Not on file    Review of Systems: A 12 point ROS discussed and pertinent positives are indicated in the HPI above.  All other systems are negative.  Review of Systems  Constitutional: Negative for appetite change and fatigue.  Respiratory: Positive for shortness of breath. Negative for cough.        Secondary to abdominal incisions  Cardiovascular: Negative for chest pain.  Gastrointestinal: Positive for abdominal pain and nausea. Negative for diarrhea and vomiting.       Abdominal incisions  Musculoskeletal: Negative for back pain.  Neurological: Negative for headaches.    Vital Signs: BP (!) 146/80 (BP Location: Right Arm)   Pulse 70   Temp (!) 97.3 F (36.3 C) (Oral)   Resp 18   Ht 5\' 6"  (1.676 m)   Wt 220 lb 14.4 oz (100.2 kg)   SpO2 98%   BMI 35.65 kg/m   Physical Exam Constitutional:      General: He is not in acute distress. HENT:     Mouth/Throat:     Mouth: Mucous membranes are moist.     Pharynx: Oropharynx is clear.  Cardiovascular:     Rate and Rhythm: Normal rate and regular rhythm.     Pulses: Normal pulses.     Heart sounds: Normal heart sounds.     Comments: Left  upper arm fistula, negative for thrill and bruit. Site is unremarkable.  Pulmonary:     Effort: Pulmonary effort is normal.     Breath sounds: Normal breath sounds.  Abdominal:     Palpations: Abdomen is soft.     Tenderness: There is abdominal tenderness.     Comments: Left lower abdomen incision, two left mid-abdomen incisions. All are clean and dry, well-approximated with skin glue. Right abdomen has several older scars from prior surgery.   Skin:    General: Skin is warm and dry.  Neurological:     Mental Status: He is alert and oriented to person, place, and time.     Imaging: MR ABDOMEN WO CONTRAST  Result Date: 10/17/2020  CLINICAL DATA:  Follow up left renal neoplasm. Reported partial right nephrectomy for renal cell carcinoma 12/05/2017. EXAM: MRI ABDOMEN WITHOUT CONTRAST TECHNIQUE: Multiplanar multisequence MR imaging was performed without the administration of intravenous contrast. COMPARISON:  Ultrasound 08/09/2017. MRI 09/08/2017, 07/01/2018 and 12/28/2018. FINDINGS: Lower chest:  The visualized lower chest appears unremarkable. Hepatobiliary: Stable tiny cysts within the left hepatic lobe. No suspicious liver lesions. No evidence of gallstones, gallbladder wall thickening or biliary dilatation. Pancreas: Unremarkable. No pancreatic ductal dilatation or surrounding inflammatory changes. Spleen: Normal in size without focal abnormality. Adrenals/Urinary Tract: Both adrenal glands appear normal. Right kidney: Postsurgical changes from previous partial nephrectomy. Dominant cystic lesion in the upper pole has enlarged, measuring up to 6.0 x 5.4 cm on image 17/4. This cystic lesion demonstrates low T1 and high T2 signal, without suspicious features on noncontrast imaging. There are scattered small T1 hyperintense lesions which are probable hemorrhagic cysts. No suspicious right renal masses on noncontrast imaging. Left kidney: Progressive enlargement of the dominant lobulated mass  involving the lower pole, now measuring 9.1 x 8.0 x 5.7 cm. This most recently measured 4.7 x 5.2 x 4.6 cm. This lesion is very complex with heterogeneous T1 and T2 signal and is highly suspicious for renal cell carcinoma on noncontrast imaging. In the upper pole of the left kidney, there is a T1 hyperintense lesion which has mildly enlarged, now measuring 2.5 x 2.1 cm on image 19/4. Complex lesion in the anterior interpolar region of the left kidney measures 1.6 cm on image 24/4 and appears stable. Complex lesion in the posterior lower pole of the left kidney measures 2.3 cm on image 38/9 and does not appear significantly changed. There are additional T1 hyperintense left renal lesions consistent with hemorrhagic cysts. Stomach/Bowel: No evidence of bowel wall thickening, distention or surrounding inflammatory change. Vascular/Lymphatic: There are no enlarged abdominal lymph nodes. Small retroperitoneal lymph nodes are stable. No significant vascular findings on noncontrast imaging. The renal veins and IVC appear patent. Other: No ascites or peritoneal nodularity. Musculoskeletal: No acute or significant osseous findings. IMPRESSION: 1. Significant further enlargement of complex mass involving the lower pole of the left kidney, highly suspicious for renal cell carcinoma on noncontrast imaging. New gadolinium guidelines allow post-contrast imaging with group two agents (such as Gadavist) in patients with renal insufficiency. Consider further characterization with post-contrast imaging or tissue sampling. 2. The other Bosniak 1 and 2 lesions have not significantly changed. Stable postsurgical changes from previous partial right nephrectomy. 3. No evidence of metastatic disease. Electronically Signed   By: Richardean Sale M.D.   On: 10/17/2020 09:59    Labs:  CBC: Recent Labs    11/09/20 1316 11/10/20 1738 11/11/20 0048 11/12/20 0841  WBC 12.5*  --   --   --   HGB 12.9* 12.0* 11.4* 11.0*  HCT 39.8 36.8*  35.1* 34.1*  PLT 223  --   --   --     COAGS: No results for input(s): INR, APTT in the last 8760 hours.  BMP: Recent Labs    11/09/20 1316 11/10/20 1738 11/11/20 0048 11/12/20 0841  NA 142 142 136 135  K 4.2 4.5 4.7 4.3  CL 105 105 102 102  CO2 24 24 19* 18*  GLUCOSE 96 133* 207* 95  BUN 55* 57* 61* 78*  CALCIUM 9.8 9.2 9.0 9.1  CREATININE 6.86* 7.28* 7.38* 9.61*  GFRNONAA 9* 8* 8* 6*    LIVER FUNCTION TESTS: Recent Labs    11/12/20 0841  ALBUMIN  3.8    TUMOR MARKERS: No results for input(s): AFPTM, CEA, CA199, CHROMGRNA in the last 8760 hours.  Assessment and Plan:  ESRD; malfunctioning left upper arm fistula: Logan Decker, 60 year old male, is tentatively scheduled 11/13/20 for a left upper arm fistulogram with possible intervention, possible placement of tunneled dialysis catheter. He has been NPO.   Risks and benefits discussed with the patient including, but not limited to bleeding, infection, vascular injury, pulmonary embolism, need for tunneled HD catheter placement or even death.  All of the patient's questions were answered, patient is agreeable to proceed.  Consent signed and in the IR control room.   Thank you for this interesting consult.  I greatly enjoyed meeting Logan Decker and look forward to participating in their care.  A copy of this report was sent to the requesting provider on this date.  Electronically Signed: Soyla Dryer, AGACNP-BC [phone] 11/13/2020, 11:10 AM   I spent a total of 20 Minutes    in face to face in clinical consultation, greater than 50% of which was counseling/coordinating care for left upper arm fistulogram with possible intervention.

## 2020-11-13 NOTE — Consult Note (Signed)
Medical Consultation   Logan Decker  DXI:[id number]  DOB: 1961-08-07  DOA: 11/10/2020  PCP: Robert Bellow, PA-C   Outpatient Specialists: [facility] - vascular surgery; [facility] - urology; [facility] - neurology    Requesting physician: [facility] - urology  Reason for consultation: Had nephrectomy with plan for ESRD.  Prolonged time for transfer due to bed availability.  Fistula clotted, needs initiation of HD.  Nephrology consulted IR for declotting vs. Placement of TDC.  He will then need HD.   History of Present Illness: Logan Decker is an 60 y.o. male with h/o seizure d/o s/p ruptured brain aneurysm; stage 4 CKD; HTN; HLD; and RCC s/p partial R nephrectomy who presented on 1/21 for radical L nephrectomy due to kidney cancer.  He was planned to initiate HD for ESRD although this has not yet started (delayed due to bed availability and then fistula access malfunction).  Patient reports that he is somewhat fatigued and SOB.  He has significant pain from his nephrectomy.  He thinks that the access issue may have occurred when a nurse unwittingly placed a BP cuff over his fistula.    Review of Systems:  ROS As per HPI otherwise 10 point review of systems negative.    Past Medical History: Past Medical History:  Diagnosis Date  . Arthritis    "knees" (08/08/2017)  . Brain aneurysm    ruptured brain aneurysm, treated surgically, was very sick, 09-Feb-2002, "almost died"  . CKD (chronic kidney disease) stage 4, GFR 15-29 ml/min (HCC)   . Gout   . High cholesterol   . Hypertension   . Seizures ([place])    "because of scaring on brain tissue after OR for ruptured aneursym; on daily RX; last sz was in 07/2017" (08/08/2017)  . Wears dentures     Past Surgical History: Past Surgical History:  Procedure Laterality Date  . AMPUTATION Right 08/08/2017   Procedure: AMPUTATION right tumb;  Surgeon: Milly Jakob, MD;  Location: [place];  Service: Orthopedics;  Laterality: Right;  .  AMPUTATION FINGER Right 08/08/2017   thumb  . AV FISTULA PLACEMENT Left 03/23/2020   Procedure: LEFT ARM ARTERIOVENOUS (AV) FISTULA CREATION;  Surgeon: Waynetta Sandy, MD;  Location: [place];  Service: Vascular;  Laterality: Left;  . CEREBRAL ANEURYSM REPAIR  2002-02-09   [employer]  . COLONOSCOPY    . FISTULA SUPERFICIALIZATION Left 05/30/2020   Procedure: LEFT BRACHIOCEPHALIC Transposition of FISTULA .;  Surgeon: Waynetta Sandy, MD;  Location: [place];  Service: Vascular;  Laterality: Left;  . LAPAROSCOPIC NEPHRECTOMY Left 11/10/2020   Procedure: LEFT LAPAROSCOPIC RADICAL NEPHRECTOMY;  Surgeon: Ardis Hughs, MD;  [facility];  Service: Urology;  Laterality: Left;  [name] MULTIPLE TOOTH EXTRACTIONS    . ROBOTIC ASSITED PARTIAL NEPHRECTOMY Right 12/05/2017   Procedure: XI ROBOTIC ASSITED LAPAROSCOPIC RIGHT PARTIAL NEPHRECTOMY;  Surgeon: Ardis Hughs, MD;  [facility];  Service: Urology;  Laterality: Right;  [name] VARICOSE VEIN SURGERY Bilateral    legs     Allergies:   Allergies  Allergen Reactions  . Ace Inhibitors Swelling    *Lotension   . Zonisamide Rash     Social History:  reports that he has been smoking cigarettes. He has smoked for the past 35.00 years. He has never used smokeless tobacco. He reports that he does not drink alcohol and does not use drugs.   Family History: History reviewed.  No pertinent family history.    Physical Exam: Vitals:   11/12/20 1956 11/12/20 2317 11/13/20 0530 11/13/20 0806  BP: (!) 168/108 106/70 126/82 (!) 146/80  Pulse: 75 63 71 70  Resp:  20 20 18   Temp: 98.5 F (36.9 C) (!) 97.5 F (36.4 C) 98 F (36.7 C) (!) 97.3 F (36.3 C)  TempSrc: Oral Oral Oral Oral  SpO2: 94% 95% 94% 98%  Weight:      Height:        Constitutional: Alert and awake, oriented x3, not in any acute distress. Eyes: EOMI, irises appear normal, anicteric sclera,  ENMT: external ears and nose appear normal, normal hearing, Lips appear  normal  Neck: neck appears normal, no masses, normal ROM, no thyromegaly, no JVD  CVS: S1-S2 clear, no murmur rubs or gallops Respiratory:  clear to auscultation bilaterally, no wheezing, rales or rhonchi. Respiratory effort normal. No accessory muscle use.  Abdomen: very tender s/p nephrectomy Musculoskeletal: : no cyanosis, clubbing or edema noted bilaterally Psych: judgement and insight appear normal, stable mood and affect, mental status Skin: no rashes or lesions or ulcers, no induration or nodules    Data reviewed:  I have personally reviewed the recent labs and imaging studies  Pertinent Labs:   CO2 18 BUN 78/Creatinine 9.1/GFR 6 Phosphorus 5.8 Hgb 11.0   Inpatient Medications:   Scheduled Meds: . acetaminophen  1,000 mg Oral Q6H  . allopurinol  100 mg Oral BID  . amLODipine  10 mg Oral Daily  . atenolol  100 mg Oral Daily  . calcitRIOL  0.25 mcg Oral Daily  . Chlorhexidine Gluconate Cloth  6 each Topical Q0600  . docusate sodium  100 mg Oral BID  . levETIRAcetam  500 mg Oral BID  . mouth rinse  15 mL Mouth Rinse BID  . senna  1 tablet Oral Daily   Continuous Infusions:   Radiological Exams on Admission: No results found.  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 -He was subsequently found to have a large L renal mass -He is s/p L radical nephrectomy -Pathology is pending but appears likely to be cancer -Urology was following the patient at [facility] but he is now HD-dependent -He is cleared for d/c from a urology standpoint and outpatient f/u has been arranged (see urology d/c summary)  New ESRD -Patient with prior CKD -Now that is s/p L nephrectomy and partial R nephrectomy, he will be HD dependent -He has a  fistula but it is not currently functional -IR has been consulted to either declot the fistula or place a TDC -Needs HD prior to d/c -Will need clipping -Management as per nephrology -Continue Calcitriol -Continue Allopurinol for gout prevention  HTN -Continue Norvasc, Atenolol -He may need medication adjustment after starting HD  Seizure d/o -Continue Keppra  HLD -Continue Lipitor    Thank you for this consultation.  Our [facility] hospitalist team will assume care of the patient at this time.   Time Spent: 50 minutes  Karmen Bongo M.D. [facility] 11/13/2020, 3:49 PM

## 2020-11-13 NOTE — Discharge Instructions (Signed)
1.  Activity:  You are encouraged to ambulate frequently (about every hour during waking hours) to help prevent blood clots from forming in your legs or lungs.  However, you should not engage in any heavy lifting (> 10-15 lbs), strenuous activity, or straining. 2. Diet: You should advance your diet as instructed by your physician.  It will be normal to have some bloating, nausea, and abdominal discomfort intermittently. 3. Prescriptions:  You will be provided a prescription for pain medication to take as needed.  If your pain is not severe enough to require the prescription pain medication, you may take extra strength Tylenol instead which will have less side effects.  You should also take a prescribed stool softener to avoid straining with bowel movements as the prescription pain medication may constipate you. 4. Incisions:  You have some special tissue glue at each of the incision sites.  You may start showering (but not soaking or bathing in water) the 2nd day after surgery and the incisions simply need to be patted dry after the shower.  No additional care is needed. 5. What to call us about: You should call the office [phone]) if you develop fever > 101 or develop persistent vomiting.

## 2020-11-13 NOTE — Discharge Summary (Shared)
Date of admission: 11/10/2020  Date of discharge: 11/13/2020  Admission diagnosis: left renal mass  Discharge diagnosis: Left renal mass  Secondary diagnoses: CKD  History and Physical: For full details, please see admission history and physical. Briefly, Logan Decker is a 60 y.o. year old patient with history of CKD, RCC s/p right partial nephrectomy in 2019, with large left renal mass who presents for left radical nephrectomy.[name]   Hospital Course: Patient underwent left laparascopic radical nephrectomy on 11/10/20. He tolerated the procedure well. Foley catheter was removed on POD1 and patient was able to void spontaneously. By POD1 he was ambulating the halls and pain was well controlled with oral medications. Given patient's preop renal function, he had met with nephrology prior to surgery and was aware of the likely need to initiate dialysis post operatively. Dialysis was delayed secondary to bed status at [facility], patient was transferred from [facility] to [facility] on POD2. On POD3, it was determined that patient's AV fistula had clotted, so interventional radiology was consulted.  ***  Laboratory values: Recent Labs    11/10/20 1738 11/11/20 0048 11/12/20 0841  HGB 12.0* 11.4* 11.0*  HCT 36.8* 35.1* 34.1*   Recent Labs    11/11/20 0048 11/12/20 0841  CREATININE 7.38* 9.61*    Disposition: Home  Discharge instruction: The patient was instructed to be ambulatory but told to refrain from heavy lifting, strenuous activity, or driving.   Discharge medications:  Allergies as of 11/13/2020      Reactions   Ace Inhibitors Swelling   *Lotension    Zonisamide Rash      Medication List    TAKE these medications   allopurinol 100 MG tablet Commonly known as: ZYLOPRIM Take 100 mg by mouth 2 (two) times daily.   amLODipine 10 MG tablet Commonly known as: NORVASC Take 10 mg by mouth daily.   atenolol 100 MG tablet Commonly known as: TENORMIN Take 100 mg by mouth  daily.   atorvastatin 40 MG tablet Commonly known as: LIPITOR Take 40 mg by mouth daily.   calcitRIOL 0.25 MCG capsule Commonly known as: ROCALTROL Take 0.25 mcg by mouth daily.   ferrous sulfate 325 (65 FE) MG tablet Take 325 mg by mouth daily with breakfast.   furosemide 40 MG tablet Commonly known as: LASIX Take 40 mg by mouth 2 (two) times daily.   levETIRAcetam 500 MG tablet Commonly known as: KEPPRA Take 500 mg by mouth 2 (two) times daily.   traMADol 50 MG tablet Commonly known as: Ultram Take 1-2 tablets (50-100 mg total) by mouth every 6 (six) hours as needed for moderate pain or severe pain.   Vitamin D-3 125 MCG (5000 UT) Tabs Take 5,000 mg by mouth daily.       Followup:   Follow-up Information    Karen Kays, NP On 11/24/2020.   Specialty: Nurse Practitioner Why: at 2:00 Contact information: [address] [place] [address] [phone]

## 2020-11-13 NOTE — Progress Notes (Addendum)
Notified Md on-call provider that patient had safely transported from [place] to [facility].   Also made her aware that the patient's left upper arm fistula didn't appear to have a bruit and felt a slight thrill when assessed.  She said they are aware that it has a weak bruit but hopes it will function well for dialysis on 11/13/20.  Patient has a restricted arm bracelet and we are taking BPs on right lower arm.   Will continue to monitor for patient safety.

## 2020-11-14 ENCOUNTER — Inpatient Hospital Stay (HOSPITAL_COMMUNITY): Payer: Medicaid Other

## 2020-11-14 DIAGNOSIS — T82868A Thrombosis of vascular prosthetic devices, implants and grafts, initial encounter: Secondary | ICD-10-CM | POA: Diagnosis present

## 2020-11-14 DIAGNOSIS — N2889 Other specified disorders of kidney and ureter: Secondary | ICD-10-CM | POA: Diagnosis not present

## 2020-11-14 HISTORY — PX: IR PERC TUN PERIT CATH WO PORT S&I /IMAG: IMG2327

## 2020-11-14 HISTORY — PX: IR US GUIDE VASC ACCESS RIGHT: IMG2390

## 2020-11-14 LAB — RENAL FUNCTION PANEL
Albumin: 3.2 g/dL — ABNORMAL LOW (ref 3.5–5.0)
Anion gap: 18 — ABNORMAL HIGH (ref 5–15)
BUN: 96 mg/dL — ABNORMAL HIGH (ref 6–20)
CO2: 16 mmol/L — ABNORMAL LOW (ref 22–32)
Calcium: 9.3 mg/dL (ref 8.9–10.3)
Chloride: 99 mmol/L (ref 98–111)
Creatinine, Ser: 12.53 mg/dL — ABNORMAL HIGH (ref 0.61–1.24)
GFR, Estimated: 4 mL/min — ABNORMAL LOW (ref >60)
Glucose, Bld: 97 mg/dL (ref 70–99)
Phosphorus: 9.2 mg/dL — ABNORMAL HIGH (ref 2.5–4.6)
Potassium: 5 mmol/L (ref 3.5–5.1)
Sodium: 133 mmol/L — ABNORMAL LOW (ref 135–145)

## 2020-11-14 MED ORDER — MIDAZOLAM HCL 2 MG/2ML IJ SOLN
INTRAMUSCULAR | Status: AC | PRN
  Administered 2020-11-14 (×2): 1 mg via INTRAVENOUS

## 2020-11-14 MED ORDER — FENTANYL CITRATE (PF) 100 MCG/2ML IJ SOLN
INTRAMUSCULAR | Status: AC
  Filled 2020-11-14: qty 2

## 2020-11-14 MED ORDER — GELATIN ABSORBABLE 12-7 MM EX MISC
CUTANEOUS | Status: AC
  Filled 2020-11-14: qty 1

## 2020-11-14 MED ORDER — FENTANYL CITRATE (PF) 100 MCG/2ML IJ SOLN
INTRAMUSCULAR | Status: AC | PRN
  Administered 2020-11-14 (×2): 50 ug via INTRAVENOUS

## 2020-11-14 MED ORDER — HEPARIN SODIUM (PORCINE) 1000 UNIT/ML IJ SOLN
INTRAMUSCULAR | Status: AC
  Filled 2020-11-14: qty 1

## 2020-11-14 MED ORDER — CEFAZOLIN SODIUM-DEXTROSE 2-4 GM/100ML-% IV SOLN: 2.0000 g | Freq: Once | INTRAVENOUS | Status: AC

## 2020-11-14 MED ORDER — MIDAZOLAM HCL 2 MG/2ML IJ SOLN
INTRAMUSCULAR | Status: AC
  Filled 2020-11-14: qty 2

## 2020-11-14 MED ORDER — LIDOCAINE-EPINEPHRINE 1 %-1:100000 IJ SOLN
INTRAMUSCULAR | Status: AC
  Filled 2020-11-14: qty 1

## 2020-11-14 MED ORDER — CEFAZOLIN SODIUM-DEXTROSE 2-4 GM/100ML-% IV SOLN
INTRAVENOUS | Status: AC
  Administered 2020-11-14: 2 g via INTRAVENOUS
  Filled 2020-11-14: qty 100

## 2020-11-14 MED ORDER — ATORVASTATIN CALCIUM 40 MG PO TABS
40.0000 mg | ORAL_TABLET | Freq: Every day | ORAL | Status: DC
Start: 2020-11-14 — End: 2020-11-14
  Administered 2020-11-14: 40 mg via ORAL
  Filled 2020-11-14: qty 1

## 2020-11-14 NOTE — Progress Notes (Signed)
PROGRESS NOTE    Logan Decker  OIZ:[id number] DOB: [birth year] DOA: 11/10/2020 PCP: Robert Bellow, PA-C    Brief Narrative:  Logan Decker is a 60 year old male with past medical history significant for seizure disorder s/p ruptured brain aneurysm, CKD stage IV, essential hypertension, hyperlipidemia, history of renal cell carcinoma s/p partial right nephrectomy who presented to [facility] on 11/10/2020 for radical left nephrectomy due to kidney cancer.  He was planned to start hemodialysis for ESRD but this was delayed due to bed availability and then fistula access malfunction.  Patient was transferred to [facility] on 11/13/2020 for hemodialysis and IR intervention for clotted AV fistula.   Assessment & Plan:   Principal Problem:   Left renal mass Active Problems:   Essential hypertension   Seizure disorder (HCC)   ESRD (end stage renal disease) (HCC)   Dyslipidemia   Left renal mass Patient presenting to [place] [facility] hospital under urology service for left renal mass status post laparoscopic radical nephrectomy on 11/10/2020 by Dr. Louis Meckel.  History of renal cell carcinoma with partial right nephrectomy. --Pathology: Pending --Outpatient follow-up with urology  ESRD on HD Patient now dialysis dependent with recent left nephrectomy and partial right nephrectomy in the past.  Patient with left AV fistula, but apparently clotted off.  Renal navigator has set up outpatient HD at [facility] on a T/T/S schedule with seat time 1130 to start on 11/17/2019. --Nephrology following --Thrombectomy/thrombolysis AV fistula need for possible [facility] placement --Per nephrology, okay to discharge after [facility] placed, if AVF is declotted they would like him to remain here to try dialysis before discharge.  Essential hypertension BP 124/75 this morning, well controlled. --Amlodipine 10 mg p.o. daily --Atenolol 100 mg p.o. daily  Seizure disorder --Keppra 500 mg p.o. twice  daily  Hyperlipidemia --Continue home atorvastatin 40 mg p.o. daily  Weakness/gait disturbance: Patient states needs to utilize walker and feels off balance. --PT evaluation  DVT prophylaxis: SCDs/ambulation   Code Status: Full Code Family Communication: Updated patient extensively at bedside  Disposition Plan:  Level of care: Med-Surg Status is: Inpatient  Remains inpatient appropriate because:Ongoing diagnostic testing needed not appropriate for outpatient work up, Unsafe d/c plan and Inpatient level of care appropriate due to severity of illness   Dispo: The patient is from: Home              Anticipated d/c is to: Home              Anticipated d/c date is: 1 day              Patient currently is not medically stable to d/c.   Difficult to place patient No   Consultants:   Urology, Dr. Louis Meckel  Nephrology  Interventional radiology  Procedures:   Laparoscopic radical nephrectomy, 11/10/2020, Dr. Louis Meckel  Antimicrobials:   Perioperative cefazolin    Subjective: Patient seen and examined at bedside, sitting at edge of bed.  States he feels "wobbly" while ambulating to the bathroom this morning.  Ready to get his fistula declotted so he can go home.  No other complaints or questions at this time.  Denies headache, no chest pain, palpitations, no shortness of breath, no abdominal pain.  No acute events reported overnight per nursing staff.  Objective: Vitals:   11/13/20 1949 11/13/20 2313 11/14/20 0341 11/14/20 0802  BP: (!) 146/80 128/83 124/75 125/82  Pulse: 74 73 72 (!) 59  Resp: 18 20 20 18   Temp: (!) 97.5  F (36.4 C) 98.4 F (36.9 C) 98 F (36.7 C) 98.4 F (36.9 C)  TempSrc: Oral Oral Oral Oral  SpO2: 97% 97% 95% 99%  Weight:      Height:        Intake/Output Summary (Last 24 hours) at 11/14/2020 1004 Last data filed at 11/13/2020 1858 Gross per 24 hour  Intake --  Output 800 ml  Net -800 ml   Filed Weights   11/10/20 1839  Weight: 100.2 kg     Examination:  General exam: Appears calm and comfortable  Respiratory system: Clear to auscultation. Respiratory effort normal.  On room air Cardiovascular system: S1 & S2 heard, RRR. No JVD, murmurs, rubs, gallops or clicks. No pedal edema. Gastrointestinal system: Abdomen is nondistended, soft and nontender. No organomegaly or masses felt. Normal bowel sounds heard. Central nervous system: Alert and oriented. No focal neurological deficits. Extremities: Symmetric 5 x 5 power.  Left AVF noted, no bruit Skin: No rashes, lesions or ulcers Psychiatry: Judgement and insight appear normal. Mood & affect appropriate.     Data Reviewed: I have personally reviewed following labs and imaging studies  CBC: Recent Labs  Lab 11/09/20 1316 11/10/20 1738 11/11/20 0048 11/12/20 0841  WBC 12.5*  --   --   --   HGB 12.9* 12.0* 11.4* 11.0*  HCT 39.8 36.8* 35.1* 34.1*  MCV 88.8  --   --   --   PLT 223  --   --   --    Basic Metabolic Panel: Recent Labs  Lab 11/10/20 1738 11/11/20 0048 11/12/20 0841 11/13/20 0621 11/14/20 0553  NA 142 136 135 136 133*  K 4.5 4.7 4.3 4.2 5.0  CL 105 102 102 101 99  CO2 24 19* 18* 20* 16*  GLUCOSE 133* 207* 95 93 97  BUN 57* 61* 78* 86* 96*  CREATININE 7.28* 7.38* 9.61* 11.67* 12.53*  CALCIUM 9.2 9.0 9.1 9.1 9.3  PHOS  --   --  5.8* 8.5* 9.2*   GFR: Estimated Creatinine Clearance: 7.2 mL/min (A) (by C-G formula based on SCr of 12.53 mg/dL (H)). Liver Function Tests: Recent Labs  Lab 11/12/20 0841 11/13/20 0621 11/14/20 0553  ALBUMIN 3.8 3.0* 3.2*   No results for input(s): LIPASE, AMYLASE in the last 168 hours. No results for input(s): AMMONIA in the last 168 hours. Coagulation Profile: No results for input(s): INR, PROTIME in the last 168 hours. Cardiac Enzymes: No results for input(s): CKTOTAL, CKMB, CKMBINDEX, TROPONINI in the last 168 hours. BNP (last 3 results) No results for input(s): PROBNP in the last 8760 hours. HbA1C: No  results for input(s): HGBA1C in the last 72 hours. CBG: No results for input(s): GLUCAP in the last 168 hours. Lipid Profile: No results for input(s): CHOL, HDL, LDLCALC, TRIG, CHOLHDL, LDLDIRECT in the last 72 hours. Thyroid Function Tests: No results for input(s): TSH, T4TOTAL, FREET4, T3FREE, THYROIDAB in the last 72 hours. Anemia Panel: No results for input(s): VITAMINB12, FOLATE, FERRITIN, TIBC, IRON, RETICCTPCT in the last 72 hours. Sepsis Labs: No results for input(s): PROCALCITON, LATICACIDVEN in the last 168 hours.  Recent Results (from the past 240 hour(s))  Surgical pcr screen     Status: None   Collection Time: 11/09/20  1:11 PM   Specimen: Nasal Mucosa; Nasal Swab  Result Value Ref Range Status   MRSA, PCR NEGATIVE NEGATIVE Final   Staphylococcus aureus NEGATIVE NEGATIVE Final    Comment: (NOTE) The Xpert SA Assay (FDA approved for NASAL specimens in  patients 69 years of age and older), is one component of a comprehensive surveillance program. It is not intended to diagnose infection nor to guide or monitor treatment. Performed at [facility], [place] [address], [place], [place] [address]   Urine culture     Status: Abnormal   Collection Time: 11/09/20  1:16 PM   Specimen: Urine, Clean Catch  Result Value Ref Range Status   Specimen Description   Final    URINE, CLEAN CATCH Performed at [facility], [place] [address] [place], [place] [address]    Special Requests   Final    NONE Performed at [facility], [place] [address], [place], [place] [address]    Culture (A)  Final    <10,000 COLONIES/mL INSIGNIFICANT GROWTH Performed at [place] [address] [place], [place] [address]    Report Status 11/11/2020 FINAL  Final  SARS CORONAVIRUS 2 (TAT 6-24 HRS) Nasopharyngeal Nasopharyngeal Swab     Status: None   Collection Time: 11/09/20  5:48 PM   Specimen: Nasopharyngeal Swab  Result Value Ref Range  Status   SARS Coronavirus 2 NEGATIVE NEGATIVE Final    Comment: (NOTE) SARS-CoV-2 target nucleic acids are NOT DETECTED.  The SARS-CoV-2 RNA is generally detectable in upper and lower respiratory specimens during the acute phase of infection. Negative results do not preclude SARS-CoV-2 infection, do not rule out co-infections with other pathogens, and should not be used as the sole basis for treatment or other patient management decisions. Negative results must be combined with clinical observations, patient history, and epidemiological information. The expected result is Negative.  Fact Sheet for Patients: SugarRoll.be  Fact Sheet for Healthcare Providers: https://www.woods-mathews.com/  This test is not yet approved or cleared by the [place] FDA and  has been authorized for detection and/or diagnosis of SARS-CoV-2 by FDA under an Emergency Use Authorization (EUA). This EUA will remain  in effect (meaning this test can be used) for the duration of the COVID-19 declaration under Se ction 564(b)(1) of the Act, 21 U.S.C. section 360bbb-3(b)(1), unless the authorization is terminated or revoked sooner.  Performed at [facility], [place] [address], [place], [place] [address]          Radiology Studies: No results found.      Scheduled Meds: . acetaminophen  1,000 mg Oral Q6H  . allopurinol  100 mg Oral BID  . amLODipine  10 mg Oral Daily  . atenolol  100 mg Oral Daily  . atorvastatin  40 mg Oral Daily  . calcitRIOL  0.25 mcg Oral Daily  . Chlorhexidine Gluconate Cloth  6 each Topical Q0600  . docusate sodium  100 mg Oral BID  . levETIRAcetam  500 mg Oral BID  . mouth rinse  15 mL Mouth Rinse BID  . senna  1 tablet Oral Daily   Continuous Infusions:   LOS: 4 days    Time spent: 36 minutes spent on chart review, discussion with nursing staff, consultants, updating family and interview/physical exam; more than  50% of that time was spent in counseling and/or coordination of care.    Vishruth Seoane J [place] DO [facility] Available via Epic secure chat 7am-7pm After these hours, please refer to coverage provider listed on [email or web address] 11/14/2020, 10:04 AM

## 2020-11-14 NOTE — Progress Notes (Signed)
Evaluation after Contrast Extravasation  Patient seen and examined immediately after contrast extravasation while in IR. Per RN, approximately 150 cc of combination of both Acenf and Saline infiltrated into RUE (upper arm) at site of IV.  Exam: There is moderate swelling at the RUE area.  There is no erythema. There is no discoloration. There are no blisters. There are no signs of decreased perfusion of the skin.  It is warm to touch.  The patient has normal ROM in fingers.  Radial pulse 2+ bilaterally.  Per contrast extravasation protocol, I have instructed the patient to keep an ice pack on the area for 20-60 minutes at a time for about 48 hours.   Keep arm elevated as much as possible.   The patient understands to head to the ED if there is: - increase in pain or swelling - changed or altered sensation - ulceration or blistering - increasing redness - warmth or increasing firmness - decreased tissue perfusion as noted by decreased capillary refill or discoloration of skin - decreased pulses peripheral to site   Earley Abide PA-C 11/14/2020 3:55 PM

## 2020-11-14 NOTE — Discharge Summary (Signed)
Physician Discharge Summary  Logan Decker ZJI:[id number] DOB: 10-25-1960 DOA: 11/10/2020  PCP: Robert Bellow, PA-C  Admit date: 11/10/2020 Discharge date: 11/14/2020  Admitted From: Home Disposition: Home  Recommendations for Outpatient Follow-up:  1. Follow up with PCP in 1-2 weeks 2. Follow-up with vascular surgery, Dr. Donzetta Matters in 1-2 weeks for evaluation of a thrombosed AV fistula 3. Outpatient follow-up with urology as scheduled 4. Patient to receive HD at [facility] clinic on a TTS schedule seat time 1130am 5. Please follow up on the following pending results: Pathology from left radical nephrectomy  Home Health: PT Equipment/Devices: Walker  Discharge Condition: Stable CODE STATUS: Full code Diet recommendation: Renal diet  History of present illness:  Logan Decker is a 60 year old male with past medical history significant for seizure disorder s/p ruptured brain aneurysm, CKD stage IV, essential hypertension, hyperlipidemia, history of renal cell carcinoma s/p partial right nephrectomy who presented to [facility] on 11/10/2020 for radical left nephrectomy due to kidney cancer.  He was planned to start hemodialysis for ESRD but this was delayed due to bed availability and then fistula access malfunction.  Patient was transferred to [facility] on 11/13/2020 for hemodialysis and IR intervention for clotted AV fistula.  Hospital course:  Left renal mass Patient presenting to [place] [facility] hospital under urology service for left renal mass status post laparoscopic radical nephrectomy on 11/10/2020 by Dr. Louis Meckel.  History of renal cell carcinoma with partial right nephrectomy.  Pathology pending at time of discharge.  Outpatient follow-up with urology as scheduled.  ESRD on HD Thrombosed left upper extremity AV fistula Patient now dialysis dependent with recent left nephrectomy and partial right nephrectomy in the past.  Patient with left AV fistula, but apparently  clotted off.  Renal navigator has set up outpatient HD at [facility] on a T/T/S schedule with seat time 1130 to start on 11/17/2019.  Patient underwent attempted thrombectomy/thrombolysis of his AV fistula thrombosis by IR on 11/14/2020; unsuccessful as his left upper extremity AV fistula is completely thrombosed from the AV anastomosis to the cephalic arch.  A right IJ HD catheter was placed by IR on 11/14/2020.  Will need close follow-up with vascular surgery, Dr. Donzetta Matters for evaluation and new access.  Per nephrology, Dr. Johnney Ou okay for discharge home with planned HD on Thursday, 11/16/2020 at [facility] with seat time of 1130am.  Essential hypertension BP 124/75 this morning, well controlled.  Continue amlodipine 10 mg p.o. daily and atenolol 100 mg p.o. daily  Seizure disorder Continue Keppra 500 mg p.o. twice daily  Hyperlipidemia Continue home atorvastatin 40 mg p.o. daily  Weakness/gait disturbance: Patient states needs to utilize walker and feels off balance.  Seen by PT with recommendations of home health.  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[place])    Discharge Instructions  Discharge Instructions    Call MD for:  difficulty breathing, headache or visual disturbances   Complete by: As directed    Call MD for:  extreme fatigue   Complete by: As directed    Call MD for:  persistant dizziness or light-headedness   Complete by: As directed    Call MD for:  persistant nausea and vomiting   Complete by: As directed    Call MD for:  redness, tenderness, or signs  of infection (pain, swelling, redness, odor or green/yellow discharge around incision site)   Complete by: As directed    Call MD for:  severe uncontrolled pain   Complete by: As directed    Call MD for:  temperature >100.4   Complete by: As  directed    Diet - low sodium heart healthy   Complete by: As directed    Increase activity slowly   Complete by: As directed    No wound care   Complete by: As directed      Allergies as of 11/14/2020      Reactions   Ace Inhibitors Swelling   *Lotension    Zonisamide Rash      Medication List    TAKE these medications   allopurinol 100 MG tablet Commonly known as: ZYLOPRIM Take 100 mg by mouth 2 (two) times daily.   amLODipine 10 MG tablet Commonly known as: NORVASC Take 10 mg by mouth daily.   atenolol 100 MG tablet Commonly known as: TENORMIN Take 100 mg by mouth daily.   atorvastatin 40 MG tablet Commonly known as: LIPITOR Take 40 mg by mouth daily.   calcitRIOL 0.25 MCG capsule Commonly known as: ROCALTROL Take 0.25 mcg by mouth daily.   ferrous sulfate 325 (65 FE) MG tablet Take 325 mg by mouth daily with breakfast.   furosemide 40 MG tablet Commonly known as: LASIX Take 40 mg by mouth 2 (two) times daily.   levETIRAcetam 500 MG tablet Commonly known as: KEPPRA Take 500 mg by mouth 2 (two) times daily.   traMADol 50 MG tablet Commonly known as: Ultram Take 1-2 tablets (50-100 mg total) by mouth every 6 (six) hours as needed for moderate pain or severe pain.   Vitamin D-3 125 MCG (5000 UT) Tabs Take 5,000 mg by mouth daily.            Durable Medical Equipment  (From admission, onward)         Start     Ordered   11/14/20 0816  For home use only DME Walker rolling  Once       Question Answer Comment  Walker: With 5 Inch Wheels   Patient needs a walker to treat with the following condition Gait disorder      11/14/20 0816          Follow-up Information    Karen Kays, NP On 11/24/2020.   Specialty: Nurse Practitioner Why: at 2:00 Contact information: [address] [place] [address] [place] [address] [phone]        Robert Bellow, PA-C. Schedule an appointment as soon as possible for a visit in 1 week(s).    Specialty: Internal Medicine Contact information: [place] [address] [place] [address] [phone]        Waynetta Sandy, MD. Schedule an appointment as soon as possible for a visit in 1 week(s).   Specialties: Vascular Surgery, Cardiology Contact information: [address] [place] [address] [phone]              Allergies  Allergen Reactions  . Ace Inhibitors Swelling    *Lotension   . Zonisamide Rash    Consultations:  Nephrology  Urology  Interventional radiology   Procedures/Studies: MR ABDOMEN WO CONTRAST  Result Date: 10/17/2020 CLINICAL DATA:  Follow up left renal neoplasm. Reported partial right nephrectomy for renal cell carcinoma 12/05/2017. EXAM: MRI ABDOMEN WITHOUT CONTRAST TECHNIQUE: Multiplanar multisequence MR imaging was performed without the  administration of intravenous contrast. COMPARISON:  Ultrasound 08/09/2017. MRI 09/08/2017, 07/01/2018 and 12/28/2018. FINDINGS: Lower chest:  The visualized lower chest appears unremarkable. Hepatobiliary: Stable tiny cysts within the left hepatic lobe. No suspicious liver lesions. No evidence of gallstones, gallbladder wall thickening or biliary dilatation. Pancreas: Unremarkable. No pancreatic ductal dilatation or surrounding inflammatory changes. Spleen: Normal in size without focal abnormality. Adrenals/Urinary Tract: Both adrenal glands appear normal. Right kidney: Postsurgical changes from previous partial nephrectomy. Dominant cystic lesion in the upper pole has enlarged, measuring up to 6.0 x 5.4 cm on image 17/4. This cystic lesion demonstrates low T1 and high T2 signal, without suspicious features on noncontrast imaging. There are scattered small T1 hyperintense lesions which are probable hemorrhagic cysts. No suspicious right renal masses on noncontrast imaging. Left kidney: Progressive enlargement of the dominant lobulated mass involving the lower pole, now measuring 9.1  x 8.0 x 5.7 cm. This most recently measured 4.7 x 5.2 x 4.6 cm. This lesion is very complex with heterogeneous T1 and T2 signal and is highly suspicious for renal cell carcinoma on noncontrast imaging. In the upper pole of the left kidney, there is a T1 hyperintense lesion which has mildly enlarged, now measuring 2.5 x 2.1 cm on image 19/4. Complex lesion in the anterior interpolar region of the left kidney measures 1.6 cm on image 24/4 and appears stable. Complex lesion in the posterior lower pole of the left kidney measures 2.3 cm on image 38/9 and does not appear significantly changed. There are additional T1 hyperintense left renal lesions consistent with hemorrhagic cysts. Stomach/Bowel: No evidence of bowel wall thickening, distention or surrounding inflammatory change. Vascular/Lymphatic: There are no enlarged abdominal lymph nodes. Small retroperitoneal lymph nodes are stable. No significant vascular findings on noncontrast imaging. The renal veins and IVC appear patent. Other: No ascites or peritoneal nodularity. Musculoskeletal: No acute or significant osseous findings. IMPRESSION: 1. Significant further enlargement of complex mass involving the lower pole of the left kidney, highly suspicious for renal cell carcinoma on noncontrast imaging. New gadolinium guidelines allow post-contrast imaging with group two agents (such as Gadavist) in patients with renal insufficiency. Consider further characterization with post-contrast imaging or tissue sampling. 2. The other Bosniak 1 and 2 lesions have not significantly changed. Stable postsurgical changes from previous partial right nephrectomy. 3. No evidence of metastatic disease. Electronically Signed   By: Richardean Sale M.D.   On: 10/17/2020 09:59      Subjective: Patient seen and examined following return from IR after HD catheter placement.  IR noted AV fistula completely thrombosed recommend follow-up with vascular surgery, Dr. Donzetta Matters.  Discussed with  Dr. Johnney Ou, okay for discharge home with planned HD on Thursday.  Patient without any complaints or concerns at this time.  Denies headache, no chest pain, no palpitations, no shortness of breath, no abdominal pain.  No acute concerns per nursing staff.  Discharge Exam: Vitals:   11/14/20 1530 11/14/20 1550  BP: (!) 158/95 (!) 177/99  Pulse: 73 81  Resp: 20 18  Temp:    SpO2: 99% 97%   Vitals:   11/14/20 1411 11/14/20 1525 11/14/20 1530 11/14/20 1550  BP: (!) 160/87 (!) 159/94 (!) 158/95 (!) 177/99  Pulse: 69 75 73 81  Resp: 17 (!) 22 20 18   Temp:      TempSrc:      SpO2: 97% 98% 99% 97%  Weight:      Height:        General: Pt is alert, awake, not  in acute distress Cardiovascular: RRR, S1/S2 +, no rubs, no gallops, right IJ HD catheter noted with dressing in place, clean/dry/intact Respiratory: CTA bilaterally, no wheezing, no rhonchi Abdominal: Soft, NT, ND, bowel sounds + Extremities: no edema, no cyanosis    The results of significant diagnostics from this hospitalization (including imaging, microbiology, ancillary and laboratory) are listed below for reference.     Microbiology: Recent Results (from the past 240 hour(s))  Surgical pcr screen     Status: None   Collection Time: 11/09/20  1:11 PM   Specimen: Nasal Mucosa; Nasal Swab  Result Value Ref Range Status   MRSA, PCR NEGATIVE NEGATIVE Final   Staphylococcus aureus NEGATIVE NEGATIVE Final    Comment: (NOTE) The Xpert SA Assay (FDA approved for NASAL specimens in patients 69 years of age and older), is one component of a comprehensive surveillance program. It is not intended to diagnose infection nor to guide or monitor treatment. Performed at [facility], [place] [address] [place], [place] [address]   Urine culture     Status: Abnormal   Collection Time: 11/09/20  1:16 PM   Specimen: Urine, Clean Catch  Result Value Ref Range Status   Specimen Description   Final    URINE, CLEAN  CATCH Performed at [facility], [place] [address], [place], [place] [address]    Special Requests   Final    NONE Performed at [facility], [place] [address], [place], [place] [address]    Culture (A)  Final    <10,000 COLONIES/mL INSIGNIFICANT GROWTH Performed at [place] [address] [place], [place] [address]    Report Status 11/11/2020 FINAL  Final  SARS CORONAVIRUS 2 (TAT 6-24 HRS) Nasopharyngeal Nasopharyngeal Swab     Status: None   Collection Time: 11/09/20  5:48 PM   Specimen: Nasopharyngeal Swab  Result Value Ref Range Status   SARS Coronavirus 2 NEGATIVE NEGATIVE Final    Comment: (NOTE) SARS-CoV-2 target nucleic acids are NOT DETECTED.  The SARS-CoV-2 RNA is generally detectable in upper and lower respiratory specimens during the acute phase of infection. Negative results do not preclude SARS-CoV-2 infection, do not rule out co-infections with other pathogens, and should not be used as the sole basis for treatment or other patient management decisions. Negative results must be combined with clinical observations, patient history, and epidemiological information. The expected result is Negative.  Fact Sheet for Patients: SugarRoll.be  Fact Sheet for Healthcare Providers: https://www.woods-mathews.com/  This test is not yet approved or cleared by the [place] FDA and  has been authorized for detection and/or diagnosis of SARS-CoV-2 by FDA under an Emergency Use Authorization (EUA). This EUA will remain  in effect (meaning this test can be used) for the duration of the COVID-19 declaration under Se ction 564(b)(1) of the Act, 21 U.S.C. section 360bbb-3(b)(1), unless the authorization is terminated or revoked sooner.  Performed at [facility], [place] [address], [place], [place] [address]      Labs: BNP (last 3 results) No results for input(s): BNP in the last  8760 hours. Basic Metabolic Panel: Recent Labs  Lab 11/10/20 1738 11/11/20 0048 11/12/20 0841 11/13/20 0621 11/14/20 0553  NA 142 136 135 136 133*  K 4.5 4.7 4.3 4.2 5.0  CL 105 102 102 101 99  CO2 24 19* 18* 20* 16*  GLUCOSE 133* 207* 95 93 97  BUN 57* 61* 78* 86* 96*  CREATININE 7.28* 7.38* 9.61*  11.67* 12.53*  CALCIUM 9.2 9.0 9.1 9.1 9.3  PHOS  --   --  5.8* 8.5* 9.2*   Liver Function Tests: Recent Labs  Lab 11/12/20 0841 11/13/20 0621 11/14/20 0553  ALBUMIN 3.8 3.0* 3.2*   No results for input(s): LIPASE, AMYLASE in the last 168 hours. No results for input(s): AMMONIA in the last 168 hours. CBC: Recent Labs  Lab 11/09/20 1316 11/10/20 1738 11/11/20 0048 11/12/20 0841  WBC 12.5*  --   --   --   HGB 12.9* 12.0* 11.4* 11.0*  HCT 39.8 36.8* 35.1* 34.1*  MCV 88.8  --   --   --   PLT 223  --   --   --    Cardiac Enzymes: No results for input(s): CKTOTAL, CKMB, CKMBINDEX, TROPONINI in the last 168 hours. BNP: Invalid input(s): POCBNP CBG: No results for input(s): GLUCAP in the last 168 hours. D-Dimer No results for input(s): DDIMER in the last 72 hours. Hgb A1c No results for input(s): HGBA1C in the last 72 hours. Lipid Profile No results for input(s): CHOL, HDL, LDLCALC, TRIG, CHOLHDL, LDLDIRECT in the last 72 hours. Thyroid function studies No results for input(s): TSH, T4TOTAL, T3FREE, THYROIDAB in the last 72 hours.  Invalid input(s): FREET3 Anemia work up No results for input(s): VITAMINB12, FOLATE, FERRITIN, TIBC, IRON, RETICCTPCT in the last 72 hours. Urinalysis No results found for: COLORURINE, APPEARANCEUR, [place], [place], GLUCOSEU, [place], [place], [place], PROTEINUR, UROBILINOGEN, NITRITE, LEUKOCYTESUR Sepsis Labs Invalid input(s): PROCALCITONIN,  WBC,  LACTICIDVEN Microbiology Recent Results (from the past 240 hour(s))  Surgical pcr screen     Status: None   Collection Time: 11/09/20  1:11 PM   Specimen: Nasal Mucosa; Nasal Swab   Result Value Ref Range Status   MRSA, PCR NEGATIVE NEGATIVE Final   Staphylococcus aureus NEGATIVE NEGATIVE Final    Comment: (NOTE) The Xpert SA Assay (FDA approved for NASAL specimens in patients 31 years of age and older), is one component of a comprehensive surveillance program. It is not intended to diagnose infection nor to guide or monitor treatment. Performed at [facility], [place] [address] [place], [place] [address]   Urine culture     Status: Abnormal   Collection Time: 11/09/20  1:16 PM   Specimen: Urine, Clean Catch  Result Value Ref Range Status   Specimen Description   Final    URINE, CLEAN CATCH Performed at [facility], [place] [address], [place], [place] [address]    Special Requests   Final    NONE Performed at [facility], [place] [address] [place], [place] [address]    Culture (A)  Final    <10,000 COLONIES/mL INSIGNIFICANT GROWTH Performed at [place] [address] [place], [place] [address]    Report Status 11/11/2020 FINAL  Final  SARS CORONAVIRUS 2 (TAT 6-24 HRS) Nasopharyngeal Nasopharyngeal Swab     Status: None   Collection Time: 11/09/20  5:48 PM   Specimen: Nasopharyngeal Swab  Result Value Ref Range Status   SARS Coronavirus 2 NEGATIVE NEGATIVE Final    Comment: (NOTE) SARS-CoV-2 target nucleic acids are NOT DETECTED.  The SARS-CoV-2 RNA is generally detectable in upper and lower respiratory specimens during the acute phase of infection. Negative results do not preclude SARS-CoV-2 infection, do not rule out co-infections with other pathogens, and should not be used as the sole basis for treatment or other patient management decisions. Negative results must be combined with clinical observations,  patient history, and epidemiological information. The expected result is Negative.  Fact Sheet for Patients: SugarRoll.be  Fact Sheet for Healthcare  Providers: https://www.woods-mathews.com/  This test is not yet approved or cleared by the [place] FDA and  has been authorized for detection and/or diagnosis of SARS-CoV-2 by FDA under an Emergency Use Authorization (EUA). This EUA will remain  in effect (meaning this test can be used) for the duration of the COVID-19 declaration under Se ction 564(b)(1) of the Act, 21 U.S.C. section 360bbb-3(b)(1), unless the authorization is terminated or revoked sooner.  Performed at [facility], [place] [address] [place], [place] [address]      Time coordinating discharge: Over 30 minutes  SIGNED:   Jumar Greenstreet J [place] DO  [facility] 11/14/2020, 4:06 PM

## 2020-11-14 NOTE — Sedation Documentation (Signed)
PA Ally at bedside to discuss with patient signs and symptoms to look for in the right bicep due to IV infiltration.  Patient had swelling in the right bicep following the procedure.  Area was swollen, no redness noted on assessment.  Ice applied to the area to assist with swelling.  PA discussed with the patient signs and symptoms to look for and also this RN discussed with the RN Caryl Pina.  Patient was to observe for numbness, additional swelling, redness, unable to move arm and skin irritation.

## 2020-11-14 NOTE — Evaluation (Signed)
Physical Therapy Evaluation Patient Details Name: Logan Decker MRN: [id number] DOB: May 14, 1961 Today's Date: 11/14/2020   History of Present Illness  Pt is a 60 y/o male with PMH significant for seizure disorder s/p ruptured brain aneurysm, CKD stage IV, essential HTN, history of renal cell carcinoma s/p partial right nephrectomy who presented to [facility] hospital on 11/10/2020 for radical left nephrectomy due to kidney cancer.  He was planned to start HD for ESRD but this was delayed due to bed availability and then fistula access malfunction. Patient was transferred to [facility] on 11/13/2020 for hemodialysis and IR intervention for clotted AV fistula.  Clinical Impression  Pt admitted with above diagnosis. At the time of PT eval pt complaining of mild pain. As pt has residual mobility deficits from prior aneurysm rupture, pt likely near baseline of function. He was able to mobilize with light min guard assist with SPC and no overt LOB. Pt currently with functional limitations due to the deficits listed below (see PT Problem List). Pt will benefit from skilled PT to increase their independence and safety with mobility to allow discharge to the venue listed below.       Follow Up Recommendations Home health PT;Supervision for mobility/OOB    Equipment Recommendations  None recommended by PT    Recommendations for Other Services       Precautions / Restrictions Precautions Precautions: Fall Precaution Comments: Residual L side deficits from aneurysm rupture Restrictions Weight Bearing Restrictions: No      Mobility  Bed Mobility Overal bed mobility: Needs Assistance Bed Mobility: Supine to Sit     Supine to sit: Min guard     General bed mobility comments: Increased time due to pain. Close guard for safety.    Transfers Overall transfer level: Needs assistance Equipment used: Rolling walker (2 wheeled) Transfers: Sit to/from Stand Sit to Stand: Min guard          General transfer comment: Close guard for safety as pt powered up to full stand. VC's for proper hand placement on seated surface for safety.  Ambulation/Gait Ambulation/Gait assistance: Min guard Gait Distance (Feet): 300 Feet Assistive device: Rolling walker (2 wheeled);Straight cane Gait Pattern/deviations: Step-through pattern;Decreased dorsiflexion - left;Decreased weight shift to left Gait velocity: Decreased Gait velocity interpretation: <1.8 ft/sec, indicate of risk for recurrent falls General Gait Details: Initially with RW and progressed to [facility] use. Pt appears more comfortable with the SPC and without overt LOB.  Stairs            Wheelchair Mobility    Modified Rankin (Stroke Patients Only)       Balance Overall balance assessment: Needs assistance Sitting-balance support: No upper extremity supported;Feet supported Sitting balance-Leahy Scale: Fair     Standing balance support: Bilateral upper extremity supported;No upper extremity supported Standing balance-Leahy Scale: Poor                               Pertinent Vitals/Pain Pain Assessment: Faces Faces Pain Scale: Hurts even more Pain Location: L side with movement Pain Descriptors / Indicators: Operative site guarding;Discomfort;Sore Pain Intervention(s): Limited activity within patient's tolerance;Monitored during session;Repositioned    Home Living Family/patient expects to be discharged to:: Private residence Living Arrangements: Parent Available Help at Discharge: Family;Available 24 hours/day Type of Home: House Home Access: Level entry     Home Layout: One level Home Equipment: Grab bars - tub/shower;Cane - single point;Tub bench  Prior Function Level of Independence: Independent with assistive device(s)         Comments: SPC at times. Does not drive.     Hand Dominance        Extremity/Trunk Assessment   Upper Extremity Assessment Upper Extremity  Assessment: Defer to OT evaluation    Lower Extremity Assessment Lower Extremity Assessment: LLE deficits/detail LLE Deficits / Details: Decreased strength, muscular endurance, and decreased ankle DF due to residual deficits from aneurysm rupture.    Cervical / Trunk Assessment Cervical / Trunk Assessment: Normal  Communication   Communication: No difficulties  Cognition Arousal/Alertness: Awake/alert Behavior During Therapy: WFL for tasks assessed/performed Overall Cognitive Status: Impaired/Different from baseline Area of Impairment: Orientation;Safety/judgement;Awareness;Problem solving                 Orientation Level: Disoriented to;Situation       Safety/Judgement: Decreased awareness of safety Awareness: Emergent Problem Solving: Slow processing;Requires verbal cues General Comments: Appeared confused at times with situation but grossly oriented otherwise. For example pt asking why they would intubate him for surgery when he "already had a tube down his throat from his brain surgery" several years ago. Reoriented to situation.      General Comments      Exercises     Assessment/Plan    PT Assessment Patient needs continued PT services  PT Problem List Decreased strength;Decreased activity tolerance;Decreased balance;Decreased mobility;Decreased knowledge of use of DME;Decreased safety awareness;Decreased knowledge of precautions;Pain       PT Treatment Interventions DME instruction;Gait training;Functional mobility training;Therapeutic activities;Therapeutic exercise;Neuromuscular re-education;Patient/family education    PT Goals (Current goals can be found in the Care Plan section)  Acute Rehab PT Goals Patient Stated Goal: Home at d/c PT Goal Formulation: With patient Time For Goal Achievement: 11/21/20 Potential to Achieve Goals: Good    Frequency Min 3X/week   Barriers to discharge        Co-evaluation               AM-PAC PT "6  Clicks" Mobility  Outcome Measure Help needed turning from your back to your side while in a flat bed without using bedrails?: None Help needed moving from lying on your back to sitting on the side of a flat bed without using bedrails?: A Little Help needed moving to and from a bed to a chair (including a wheelchair)?: A Little Help needed standing up from a chair using your arms (e.g., wheelchair or bedside chair)?: A Little Help needed to walk in hospital room?: A Little Help needed climbing 3-5 steps with a railing? : A Little 6 Click Score: 19    End of Session Equipment Utilized During Treatment: Gait belt Activity Tolerance: Patient tolerated treatment well Patient left: Other (comment) (In bathroom having a BM - RN aware) Nurse Communication: Mobility status PT Visit Diagnosis: Unsteadiness on feet (R26.81);Pain Pain - Right/Left: Left Pain - part of body:  (trunk)    Time: 1660-6301 PT Time Calculation (min) (ACUTE ONLY): 17 min   Charges:   PT Evaluation $PT Eval Moderate Complexity: 1 Mod          Rolinda Roan, PT, DPT Acute Rehabilitation Services Pager: [phone] Office: [phone]   Thelma Comp 11/14/2020, 2:15 PM

## 2020-11-14 NOTE — Progress Notes (Signed)
[facility] Progress Note  Name: Logan Decker MRN: [id number] DOB: 10-Mar-1961  Chief Complaint:  Kidney mass  Subjective: AVF clotted L arm, could not to yesterday am.  IR consulted for declot vs catheter today.   No SOB, cough or jerking/ confusion. No n/v. UOP yesterday 1.1L.    Intake/Output Summary (Last 24 hours) at 11/14/2020 0813 Last data filed at 11/13/2020 1858 Gross per 24 hour  Intake -  Output 1050 ml  Net -1050 ml    Vitals:  Vitals:   11/13/20 1949 11/13/20 2313 11/14/20 0341 11/14/20 0802  BP: (!) 146/80 128/83 124/75 125/82  Pulse: 74 73 72 (!) 59  Resp: 18 20 20 18   Temp: (!) 97.5 F (36.4 C) 98.4 F (36.9 C) 98 F (36.7 C) 98.4 F (36.9 C)  TempSrc: Oral Oral Oral Oral  SpO2: 97% 97% 95% 99%  Weight:      Height:         Physical Exam:   alert, nad   no jvd  Chest cta bilat  Cor reg no RG  Abd soft ntnd no ascites   Ext no LE edema   Alert, NF, ox3, no asterixis    LUA AVF no bruit   Medications reviewed   Labs:  BMP Latest Ref Rng & Units 11/14/2020 11/13/2020 11/12/2020  Glucose 70 - 99 mg/dL 97 93 95  BUN 6 - 20 mg/dL 96(H) 86(H) 78(H)  Creatinine 0.61 - 1.24 mg/dL 12.53(H) 11.67(H) 9.61(H)  Sodium 135 - 145 mmol/L 133(L) 136 135  Potassium 3.5 - 5.1 mmol/L 5.0 4.2 4.3  Chloride 98 - 111 mmol/L 99 101 102  CO2 22 - 32 mmol/L 16(L) 20(L) 18(L)  Calcium 8.9 - 10.3 mg/dL 9.3 9.1 9.1     Assessment/Plan:   # 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 - CLIP to High Pt start date 1/27.   - OK to d/c after [facility] placed; if AVF is declotted I'd like him to remain here to try it prior to discharge.    # HTN  - Continue home meds  # Anemia CKD - Mild no acute need for ESA and note maligancy  # left renal mass - s/p left radical nephrectomy - pain control per primary team  - would avoid morphine or NSAID's  # metabolic bone disease - hyperphos - up to 9.8, renal diet and start renvela.Porum Kruska, MD 11/14/2020 8:13 AM

## 2020-11-14 NOTE — Plan of Care (Signed)
Pt given D/C instructions with verbal understanding. Rx was sent to the pharmacy by MD. Pt's incision is clean and dry with no sign of infection. Pt's IV was removed prior to D/C. Notified TOC of Home Health orders but no Home Health was available d/t his insurance. Pt received RW from [employer] per MD order. Pt D/C'd home via wheelchair per MD order. Pt is stable @ D/C and has no other needs at this time. Holli Humbles, RN

## 2020-11-14 NOTE — Procedures (Signed)
Interventional Radiology Procedure Note  Procedure: RT IJ HD CATH    Complications: None  Estimated Blood Loss:  MIN  Findings: LUE AVF is completely thrombosed from the AV anastomosis to nearly the cephalic arch and has never been used.  Needs vas surgery eval and new access (his vasc surg is Dr Donzetta Matters)  HD cath tip SVCRA    M. Daryll Brod, MD

## 2020-11-15 ENCOUNTER — Other Ambulatory Visit (HOSPITAL_COMMUNITY): Payer: Self-pay | Admitting: Nephrology

## 2020-11-15 ENCOUNTER — Telehealth: Payer: Self-pay | Admitting: Nephrology

## 2020-11-15 DIAGNOSIS — T829XXS Unspecified complication of cardiac and vascular prosthetic device, implant and graft, sequela: Secondary | ICD-10-CM

## 2020-11-15 DIAGNOSIS — T829XXA Unspecified complication of cardiac and vascular prosthetic device, implant and graft, initial encounter: Secondary | ICD-10-CM

## 2020-11-15 NOTE — Telephone Encounter (Signed)
Transition of care contact from inpatient facility  Date of Discharge: 11/14/20 Date of Contact: 11/15/20- attempt Method of contact: Phone  Attempted to contact patient to discuss transition of care from inpatient admission. Patient did not answer the phone. Message was left on the patient's voicemail that will call at another time or follow up at dialysis center.

## 2020-11-16 LAB — SURGICAL PATHOLOGY

## 2021-01-13 ENCOUNTER — Other Ambulatory Visit: Payer: Self-pay

## 2021-01-13 DIAGNOSIS — N186 End stage renal disease: Secondary | ICD-10-CM

## 2021-01-19 ENCOUNTER — Ambulatory Visit: Payer: Medicaid Other | Admitting: Vascular Surgery

## 2021-01-19 ENCOUNTER — Encounter (HOSPITAL_COMMUNITY): Payer: Medicaid Other

## 2021-01-19 ENCOUNTER — Other Ambulatory Visit (HOSPITAL_COMMUNITY): Payer: Medicaid Other

## 2021-08-15 ENCOUNTER — Emergency Department (HOSPITAL_COMMUNITY): Payer: Medicare HMO

## 2021-08-15 ENCOUNTER — Observation Stay (HOSPITAL_COMMUNITY): Payer: Medicare HMO

## 2021-08-15 ENCOUNTER — Other Ambulatory Visit: Payer: Self-pay

## 2021-08-15 ENCOUNTER — Inpatient Hospital Stay (HOSPITAL_COMMUNITY)
Admission: EM | Admit: 2021-08-15 | Discharge: 2021-08-19 | DRG: 312 | Disposition: A | Discharge status: Home / Self Care | Payer: Medicare HMO | Attending: Internal Medicine | Admitting: Internal Medicine

## 2021-08-15 DIAGNOSIS — S42291A Other displaced fracture of upper end of right humerus, initial encounter for closed fracture: Secondary | ICD-10-CM | POA: Diagnosis not present

## 2021-08-15 DIAGNOSIS — G9389 Other specified disorders of brain: Secondary | ICD-10-CM | POA: Diagnosis present

## 2021-08-15 DIAGNOSIS — N186 End stage renal disease: Secondary | ICD-10-CM | POA: Diagnosis not present

## 2021-08-15 DIAGNOSIS — R32 Unspecified urinary incontinence: Secondary | ICD-10-CM | POA: Diagnosis present

## 2021-08-15 DIAGNOSIS — E669 Obesity, unspecified: Secondary | ICD-10-CM | POA: Diagnosis present

## 2021-08-15 DIAGNOSIS — E875 Hyperkalemia: Secondary | ICD-10-CM | POA: Diagnosis present

## 2021-08-15 DIAGNOSIS — Z992 Dependence on renal dialysis: Secondary | ICD-10-CM

## 2021-08-15 DIAGNOSIS — Z89011 Acquired absence of right thumb: Secondary | ICD-10-CM

## 2021-08-15 DIAGNOSIS — Z6833 Body mass index (BMI) 33.0-33.9, adult: Secondary | ICD-10-CM

## 2021-08-15 DIAGNOSIS — I120 Hypertensive chronic kidney disease with stage 5 chronic kidney disease or end stage renal disease: Secondary | ICD-10-CM | POA: Diagnosis present

## 2021-08-15 DIAGNOSIS — R5381 Other malaise: Secondary | ICD-10-CM | POA: Diagnosis present

## 2021-08-15 DIAGNOSIS — E7800 Pure hypercholesterolemia, unspecified: Secondary | ICD-10-CM | POA: Diagnosis present

## 2021-08-15 DIAGNOSIS — Q613 Polycystic kidney, unspecified: Secondary | ICD-10-CM

## 2021-08-15 DIAGNOSIS — Z905 Acquired absence of kidney: Secondary | ICD-10-CM

## 2021-08-15 DIAGNOSIS — D631 Anemia in chronic kidney disease: Secondary | ICD-10-CM | POA: Diagnosis present

## 2021-08-15 DIAGNOSIS — E785 Hyperlipidemia, unspecified: Secondary | ICD-10-CM | POA: Diagnosis present

## 2021-08-15 DIAGNOSIS — I951 Orthostatic hypotension: Secondary | ICD-10-CM | POA: Diagnosis not present

## 2021-08-15 DIAGNOSIS — G40909 Epilepsy, unspecified, not intractable, without status epilepticus: Secondary | ICD-10-CM

## 2021-08-15 DIAGNOSIS — R159 Full incontinence of feces: Secondary | ICD-10-CM | POA: Diagnosis present

## 2021-08-15 DIAGNOSIS — F17210 Nicotine dependence, cigarettes, uncomplicated: Secondary | ICD-10-CM | POA: Diagnosis present

## 2021-08-15 DIAGNOSIS — S42201A Unspecified fracture of upper end of right humerus, initial encounter for closed fracture: Secondary | ICD-10-CM | POA: Diagnosis not present

## 2021-08-15 DIAGNOSIS — R55 Syncope and collapse: Secondary | ICD-10-CM | POA: Diagnosis present

## 2021-08-15 DIAGNOSIS — I959 Hypotension, unspecified: Secondary | ICD-10-CM | POA: Diagnosis present

## 2021-08-15 DIAGNOSIS — M1990 Unspecified osteoarthritis, unspecified site: Secondary | ICD-10-CM | POA: Diagnosis present

## 2021-08-15 DIAGNOSIS — Z20822 Contact with and (suspected) exposure to covid-19: Secondary | ICD-10-CM | POA: Diagnosis present

## 2021-08-15 DIAGNOSIS — I10 Essential (primary) hypertension: Secondary | ICD-10-CM | POA: Diagnosis present

## 2021-08-15 DIAGNOSIS — I371 Nonrheumatic pulmonary valve insufficiency: Secondary | ICD-10-CM | POA: Diagnosis present

## 2021-08-15 DIAGNOSIS — I517 Cardiomegaly: Secondary | ICD-10-CM | POA: Diagnosis present

## 2021-08-15 DIAGNOSIS — Z79899 Other long term (current) drug therapy: Secondary | ICD-10-CM

## 2021-08-15 DIAGNOSIS — N2581 Secondary hyperparathyroidism of renal origin: Secondary | ICD-10-CM | POA: Diagnosis present

## 2021-08-15 DIAGNOSIS — I953 Hypotension of hemodialysis: Secondary | ICD-10-CM | POA: Diagnosis present

## 2021-08-15 DIAGNOSIS — D72829 Elevated white blood cell count, unspecified: Secondary | ICD-10-CM | POA: Diagnosis present

## 2021-08-15 DIAGNOSIS — W010XXA Fall on same level from slipping, tripping and stumbling without subsequent striking against object, initial encounter: Secondary | ICD-10-CM | POA: Diagnosis present

## 2021-08-15 DIAGNOSIS — S42214A Unspecified nondisplaced fracture of surgical neck of right humerus, initial encounter for closed fracture: Secondary | ICD-10-CM | POA: Diagnosis present

## 2021-08-15 DIAGNOSIS — Z888 Allergy status to other drugs, medicaments and biological substances status: Secondary | ICD-10-CM

## 2021-08-15 DIAGNOSIS — M898X9 Other specified disorders of bone, unspecified site: Secondary | ICD-10-CM | POA: Diagnosis present

## 2021-08-15 LAB — CBC WITH DIFFERENTIAL/PLATELET
Abs Immature Granulocytes: 0.06 10*3/uL (ref 0.00–0.07)
Basophils Absolute: 0 10*3/uL (ref 0.0–0.1)
Basophils Relative: 0 %
Eosinophils Absolute: 0.2 10*3/uL (ref 0.0–0.5)
Eosinophils Relative: 2 %
HCT: 35 % — ABNORMAL LOW (ref 39.0–52.0)
Hemoglobin: 11.4 g/dL — ABNORMAL LOW (ref 13.0–17.0)
Immature Granulocytes: 1 %
Lymphocytes Relative: 5 %
Lymphs Abs: 0.6 10*3/uL — ABNORMAL LOW (ref 0.7–4.0)
MCH: 31.8 pg (ref 26.0–34.0)
MCHC: 32.6 g/dL (ref 30.0–36.0)
MCV: 97.8 fL (ref 80.0–100.0)
Monocytes Absolute: 0.9 10*3/uL (ref 0.1–1.0)
Monocytes Relative: 8 %
Neutro Abs: 9.6 10*3/uL — ABNORMAL HIGH (ref 1.7–7.7)
Neutrophils Relative %: 84 %
Platelets: 188 10*3/uL (ref 150–400)
RBC: 3.58 MIL/uL — ABNORMAL LOW (ref 4.22–5.81)
RDW: 13.9 % (ref 11.5–15.5)
WBC: 11.3 10*3/uL — ABNORMAL HIGH (ref 4.0–10.5)
nRBC: 0 % (ref 0.0–0.2)

## 2021-08-15 LAB — RESP PANEL BY RT-PCR (FLU A&B, COVID) ARPGX2
Influenza A by PCR: NEGATIVE
Influenza B by PCR: NEGATIVE
SARS Coronavirus 2 by RT PCR: NEGATIVE

## 2021-08-15 LAB — BASIC METABOLIC PANEL
Anion gap: 13 (ref 5–15)
BUN: 22 mg/dL — ABNORMAL HIGH (ref 6–20)
CO2: 35 mmol/L — ABNORMAL HIGH (ref 22–32)
Calcium: 9.6 mg/dL (ref 8.9–10.3)
Chloride: 92 mmol/L — ABNORMAL LOW (ref 98–111)
Creatinine, Ser: 6.85 mg/dL — ABNORMAL HIGH (ref 0.61–1.24)
GFR, Estimated: 9 mL/min — ABNORMAL LOW (ref >60)
Glucose, Bld: 87 mg/dL (ref 70–99)
Potassium: 3.9 mmol/L (ref 3.5–5.1)
Sodium: 140 mmol/L (ref 135–145)

## 2021-08-15 LAB — TROPONIN I (HIGH SENSITIVITY): Troponin I (High Sensitivity): 9 ng/L (ref <18)

## 2021-08-15 LAB — CBG MONITORING, ED: Glucose-Capillary: 114 mg/dL — ABNORMAL HIGH (ref 70–99)

## 2021-08-15 MED ORDER — FENTANYL CITRATE PF 50 MCG/ML IJ SOSY
50.0000 ug | PREFILLED_SYRINGE | Freq: Once | INTRAMUSCULAR | Status: AC
  Administered 2021-08-15: 50 ug via INTRAVENOUS
  Filled 2021-08-15: qty 1

## 2021-08-15 MED ORDER — SODIUM CHLORIDE 0.9 % IV BOLUS
500.0000 mL | Freq: Once | INTRAVENOUS | Status: AC
  Administered 2021-08-15: 500 mL via INTRAVENOUS

## 2021-08-15 MED ORDER — HYDROCODONE-ACETAMINOPHEN 5-325 MG PO TABS: 1.0000 | ORAL_TABLET | Freq: Four times a day (QID) | ORAL | 0 refills | Status: AC | PRN

## 2021-08-15 NOTE — ED Notes (Signed)
Pt called out stating "I feel like I'm going to pass out again". NT went into pts room and pt was in a cold sweat with BP in the 80's. NT placed a cold rag on pts head, retook his BP and notified the RN of pts condition. RN in pts room now.

## 2021-08-15 NOTE — ED Notes (Signed)
Pt was for discharge. Called staff and pt was diaphoretic, clammy and reported dizziness. Hypotensive at 70/49. MD notified and was at bedside. Pt will now be admitted. IVF initiated. Cardiac monitoring in place. Pt is alert and oriented x 4. Will continue to monitor.

## 2021-08-15 NOTE — ED Provider Notes (Signed)
[facility] EMERGENCY DEPARTMENT Provider Note  CSN: [id number] Arrival date & time: 08/15/21 1451    History Chief Complaint  Patient presents with  . Loss of Consciousness  . Shoulder Injury    Logan Decker is a 60 y.o. male with history of ESRD on HD MWF went to dialysis like usual this morning without difficulty. He reports after he got home he ate some lunch and sat down in his recliner to take a nap. He got up quickly to answer the door and had a sudden loss of consciousness. He did not his his head but landed hard on his R shoulder. He was incontinent of stool immediately after and was having too much pain to get up off the floor. EMS was called to bring him in. He has had some low BPs with dialysis recently, has had medications adjusted. He is complaining of R shoulder pain, moderate to severe aching and worse with movement. But otherwise feels back to baseline.    Past Medical History:  Diagnosis Date  . Arthritis    "knees" (08/08/2017)  . Brain aneurysm    ruptured brain aneurysm, treated surgically, was very sick, 28-Jan-2002, "almost died"  . CKD (chronic kidney disease) stage 4, GFR 15-29 ml/min (HCC)   . Gout   . High cholesterol   . Hypertension   . Seizures ([place])    "because of scaring on brain tissue after OR for ruptured aneursym; on daily RX; last sz was in 07/2017" (08/08/2017)  . Wears dentures     Past Surgical History:  Procedure Laterality Date  . AMPUTATION Right 08/08/2017   Procedure: AMPUTATION right tumb;  Surgeon: Milly Jakob, MD;  Location: [facility];  Service: Orthopedics;  Laterality: Right;  . AMPUTATION FINGER Right 08/08/2017   thumb  . AV FISTULA PLACEMENT Left 03/23/2020   Procedure: LEFT ARM ARTERIOVENOUS (AV) FISTULA CREATION;  Surgeon: Waynetta Sandy, MD;  Location: [facility];  Service: Vascular;  Laterality: Left;  . CEREBRAL ANEURYSM REPAIR  01/28/02   [employer]  . COLONOSCOPY    . FISTULA SUPERFICIALIZATION Left 05/30/2020   Procedure:  LEFT BRACHIOCEPHALIC Transposition of FISTULA .;  Surgeon: Waynetta Sandy, MD;  Location: [facility] OR;  Service: Vascular;  Laterality: Left;  . IR PERC TUN PERIT CATH WO PORT S&I D[facility]a [facility]  11/14/2020  . IR US GUIDE VASC ACCESS RIGHT  11/14/2020  . LAPAROSCOPIC NEPHRECTOMY Left 11/10/2020   Procedure: LEFT LAPAROSCOPIC RADICAL NEPHRECTOMY;  Surgeon: Ardis Hughs, MD;  Location: [facility];  Service: Urology;  Laterality: Left;  [name] MULTIPLE TOOTH EXTRACTIONS    . ROBOTIC ASSITED PARTIAL NEPHRECTOMY Right 12/05/2017   Procedure: XI ROBOTIC ASSITED LAPAROSCOPIC RIGHT PARTIAL NEPHRECTOMY;  Surgeon: Ardis Hughs, MD;  Location: [facility];  Service: Urology;  Laterality: Right;  [name] VARICOSE VEIN SURGERY Bilateral    legs    No family history on file.  Social History   Tobacco Use  . Smoking status: Some Days    Years: 35.00    Types: Cigarettes  . Smokeless tobacco: Never  Vaping Use  . Vaping Use: Never used  Substance Use Topics  . Alcohol use: No  . Drug use: No     Home Medications Prior to Admission medications   Medication Sig Start Date End Date Taking? Authorizing Provider  HYDROcodone-acetaminophen (NORCO/VICODIN) 5-325 MG tablet Take 1 tablet by mouth every 6 (six) hours as needed for severe pain. 08/15/21  Yes Truddie Hidden, MD  allopurinol Jackelyn Hoehn)  100 MG tablet Take 100 mg by mouth 2 (two) times daily. 11/27/18   [provider]  amLODipine (NORVASC) 10 MG tablet Take 10 mg by mouth daily.    [provider]  atenolol (TENORMIN) 100 MG tablet Take 100 mg by mouth daily.    [provider]  atorvastatin (LIPITOR) 40 MG tablet Take 40 mg by mouth daily.     [provider]  calcitRIOL (ROCALTROL) 0.25 MCG capsule Take 0.25 mcg by mouth daily. 12/18/18   [provider]  Cholecalciferol (VITAMIN D-3) 125 MCG (5000 UT) TABS Take 5,000 mg by mouth daily.    [provider]  ferrous sulfate 325 (65 FE) MG tablet Take  325 mg by mouth daily with breakfast.    [provider]  furosemide (LASIX) 40 MG tablet Take 40 mg by mouth 2 (two) times daily.  11/29/19   [provider]  levETIRAcetam (KEPPRA) 500 MG tablet Take 500 mg by mouth 2 (two) times daily. 12/08/18   [provider]     Allergies    Ace inhibitors and Zonisamide   Review of Systems   Review of Systems A comprehensive review of systems was completed and negative except as noted in HPI.    Physical Exam BP 97/65   Pulse 73   Temp 97.6 F (36.4 C) (Oral)   Resp (!) 24   Ht 5\' 5"  (1.651 m)   Wt 91.2 kg   SpO2 97%   BMI 33.45 kg/m   Physical Exam Vitals and nursing note reviewed.  Constitutional:      Appearance: Normal appearance.  HENT:     Head: Normocephalic and atraumatic.     Comments: Abrasion to scalp from recent shaving cut, no acute injury    Nose: Nose normal.     Mouth/Throat:     Mouth: Mucous membranes are moist.  Eyes:     Extraocular Movements: Extraocular movements intact.     Conjunctiva/sclera: Conjunctivae normal.  Cardiovascular:     Rate and Rhythm: Normal rate.  Pulmonary:     Effort: Pulmonary effort is normal.     Breath sounds: Normal breath sounds.  Abdominal:     Palpations: Abdomen is soft.     Tenderness: There is no abdominal tenderness. There is no guarding.  Musculoskeletal:        General: Tenderness (R AC joint, no obvious deformity) present. No swelling. Normal range of motion.     Cervical back: Neck supple.     Comments: Dialysis access in RUE with palpable thrill  Skin:    General: Skin is warm and dry.  Neurological:     General: No focal deficit present.     Mental Status: He is alert and oriented to person, place, and time.     Cranial Nerves: No cranial nerve deficit.     Sensory: No sensory deficit.     Motor: No weakness.  Psychiatric:        Mood and Affect: Mood normal.     ED Results / Procedures / Treatments   Labs (all labs ordered  are listed, but only abnormal results are displayed) Labs Reviewed  BASIC METABOLIC PANEL - Abnormal; Notable for the following components:      Result Value   Chloride 92 (*)    CO2 35 (*)    BUN 22 (*)    Creatinine, Ser 6.85 (*)    GFR, Estimated 9 (*)    All other components within  normal limits  CBC WITH DIFFERENTIAL/PLATELET - Abnormal; Notable for the following components:   WBC 11.3 (*)    RBC 3.58 (*)    Hemoglobin 11.4 (*)    HCT 35.0 (*)    Neutro Abs 9.6 (*)    Lymphs Abs 0.6 (*)    All other components within normal limits  TROPONIN I (HIGH SENSITIVITY)    EKG EKG Interpretation  Date/Time:  Wednesday August 15 2021 15:00:07 EDT Ventricular Rate:  74 PR Interval:  236 QRS Duration: 89 QT Interval:  415 QTC Calculation: 461 R Axis:   12 Text Interpretation: Sinus rhythm Prolonged PR interval Probable left atrial enlargement ST elevation, consider lateral injury No significant change since last tracing EARLIER SAME DATE Confirmed by Calvert Cantor [phone]) on 08/15/2021 3:04:28 PM  Radiology DG Shoulder Right  Result Date: 08/15/2021 CLINICAL DATA:  fall, R shoulder pain EXAM: RIGHT SHOULDER - 2+ VIEW COMPARISON:  None. FINDINGS: There is cortical regularity surrounding the right humeral head with a suspected fracture line visible only on the axillary Y-view. Findings suggest a minimally displaced fracture of the proximal humerus. However definite characterization is suboptimal on provided radiographs. IMPRESSION: High suspicion for minimally displaced fracture of the proximal humerus. A CT of the right shoulder is recommended for more definite characterization. Electronically Signed   By: Albin Felling M.D.   On: 08/15/2021 16:09    Procedures Procedures  Medications Ordered in the ED Medications  fentaNYL (SUBLIMAZE) injection 50 mcg (50 mcg Intravenous Given 08/15/21 1517)     MDM Rules/Calculators/A&P MDM Patient with syncope after standing up too  fast, likely orthostatic, has also injured his R shoulder. Will check labs and imaging. Monitor in the ED. Fentanyl for pain.   ED Course  I have reviewed the triage vital signs and the nursing notes.  Pertinent labs & imaging results that were available during my care of the patient were reviewed by me and considered in my medical decision making (see chart for details).  Clinical Course as of 08/15/21 1906  Wed Aug 15, 2021  1541 CBC with mild anemia and mild leukocytosis, otherwise unremarkable.  [CS]  1623 BMP consistent with ESRD on, hyperkalemia. Trop #1 is normal.  [CS]  4315 Patient feeling better. No chest pain or EKG changes, repeat Trop not necessary. Will check orthostatics and if asymptomatic, plan discharge home with norco for pain and Ortho follow up [CS]  4008 Patient's BP is borderline low, but not orthostatic with standing. As above, he has had similar in the past and is comfortable managing that at home. He was advised to hold his BP meds and follow up with PCP. Rx for Norco and referral to Ortho. He is going to go stay with his nephew while he recovers.  [CS]  6761 Patient provided with sling [CS]  9509 Prior to going home, patient had another episode of diaphoresis, dizziness and his BP dropped. He was lying in bed at the time. Will give a 500cc fluid bolus and reassess. Plan admission for further management. Ortho and Nephrology also will be consulted.  [CS]  1905 Spoke with Dr. Roel Cluck, Hospitalist who will admit. Spoke with Dr. Sammuel Hines, Ortho who requests CT and he will consult. Dr. Posey Pronto Nephrology also aware of patient and will follow.  [CS]    Clinical Course User Index [CS] Truddie Hidden, MD    Final Clinical Impression(s) / ED Diagnoses Final diagnoses:  Syncope, unspecified syncope type  Closed fracture of proximal end of  right humerus, unspecified fracture morphology, initial encounter    Rx / DC Orders ED Discharge Orders          Ordered     HYDROcodone-acetaminophen (NORCO/VICODIN) 5-325 MG tablet  Every 6 hours PRN        08/15/21 1716             Truddie Hidden, MD 08/15/21 [phone]

## 2021-08-15 NOTE — H&P (Addendum)
Logan Decker NGE:[id number] DOB: 10-26-1960 DOA: 08/15/2021   PCP: Robert Bellow, PA-C   [facility]  NEphrology: Dr.Webb   Lenora Boys, Emeline General, MD Patient arrived to ER on 08/15/21 at 1451 Referred by Attending Truddie Hidden, MD   Patient coming from: home Lives   With family    Chief Complaint:   Chief Complaint  Patient presents with   Loss of Consciousness   Shoulder Injury    HPI: Logan Decker is a 60 y.o. male with medical history significant of  ESRD on HD MWF, HLD, hx of Seizure DO, history of brain aneurysm    Presented with  a fall due to syncope  On HD MWF,went to HD today He was sitting talking to some friends and felt lightheaded he sat back down dried to walk to his room and fainted passed out hit the right shoulder He could not get up due to pain urinated and defecated on himself Felt diaphoretic Denies hitting head He woke up fast Has been taking his seizure meds Feels it was not a seizure  Has hx of hypotension after HD Was trying to get off some his BP meds  Has  been vaccinated against COVID  and boosted   Initial COVID TEST  NEGATIVE   Lab Results  Component Value Date   [place] 08/15/2021   [place] NEGATIVE 11/09/2020   [place] NEGATIVE 05/29/2020   [place] NEGATIVE 03/21/2020     Regarding pertinent Chronic problems:    Hyperlipidemia -  on statins lipitor Lipid Panel  No results found for: CHOL, TRIG, HDL, CHOLHDL, VLDL, LDLCALC, LDLDIRECT, LABVLDL  Seizure DO on Keppra   HTN on NOrvasc, atenolol   ESRD-    Lab Results  Component Value Date   CREATININE 6.85 (H) 08/15/2021   CREATININE 12.53 (H) 11/14/2020   CREATININE 11.67 (H) 11/13/2020     Chronic anemia - baseline hg Hemoglobin & Hematocrit  Recent Labs    11/11/20 0048 11/12/20 0841 08/15/21 1522  HGB 11.4* 11.0* 11.4*    While in ER: Initially plan was for patient to be able to be discharged to home as blood  pressure stabilized upon patient being discharged blood pressure dropped again at this point he was asked to stay for observation.  Nephrology was consulted will see patient in a.m. we will continue to hold home medications and allow permissive hypertension for tonight  Imaging showed right proximal humerus fracture which is nonoperative but orthopedics have been under rotated and aware will see in the morning   ED Triage Vitals  Enc Vitals Group     BP 08/15/21 1458 118/61     Pulse Rate 08/15/21 1458 76     Resp 08/15/21 1458 20     Temp 08/15/21 1458 97.6 F (36.4 C)     Temp Source 08/15/21 1458 Oral     SpO2 08/15/21 1458 100 %     Weight 08/15/21 1459 201 lb (91.2 kg)     Height 08/15/21 1459 5\' 5"  (1.651 m)     Head Circumference --      Peak Flow --      Pain Score 08/15/21 1459 6     Pain Loc --      Pain Edu? --      Excl. in [place]? --   TMAX(24)@     _________________________________________ Significant initial  Findings: Abnormal Labs Reviewed  BASIC METABOLIC PANEL - Abnormal; Notable for the following components:  Result Value   Chloride 92 (*)    CO2 35 (*)    BUN 22 (*)    Creatinine, Ser 6.85 (*)    GFR, Estimated 9 (*)    All other components within normal limits  CBC WITH DIFFERENTIAL/PLATELET - Abnormal; Notable for the following components:   WBC 11.3 (*)    RBC 3.58 (*)    Hemoglobin 11.4 (*)    HCT 35.0 (*)    Neutro Abs 9.6 (*)    Lymphs Abs 0.6 (*)    All other components within normal limits  CBG MONITORING, ED - Abnormal; Notable for the following components:   Glucose-Capillary 114 (*)    All other components within normal limits   ____________________________________________ Ordered    CXR - High suspicion for minimally displaced fracture of the proximal humerus. A CT of the right shoulder is recommended for more definite characterization.    CT right shoulder Nondisplaced fracture of the humeral neck. No dislocation.    _________________________ Troponin 9 ECG: Ordered Personally reviewed by me showing: HR : 74 Rhythm:  NSR,    no evidence of ischemic changes QTC 461    The recent clinical data is shown below. Vitals:   08/15/21 1600 08/15/21 1700 08/15/21 1800 08/15/21 1820  BP: 97/65 (!) 85/58 (!) 70/49 (!) 85/65  Pulse: 73 80 67 68  Resp: (!) 24 18 (!) 25 19  Temp:      TempSrc:      SpO2: 97% 99% 97% 97%  Weight:      Height:      WBC     Component Value Date/Time   WBC 11.3 (H) 08/15/2021 1522   LYMPHSABS 0.6 (L) 08/15/2021 1522   MONOABS 0.9 08/15/2021 1522   EOSABS 0.2 08/15/2021 1522   BASOSABS 0.0 08/15/2021 1522       Results for orders placed or performed during the hospital encounter of 08/15/21  Resp Panel by RT-PCR (Flu A&B, Covid) Nasopharyngeal Swab     Status: None   Collection Time: 08/15/21 10:30 PM   Specimen: Nasopharyngeal Swab; Nasopharyngeal(NP) swabs in vial transport medium  Result Value Ref Range Status   SARS Coronavirus 2 by RT PCR NEGATIVE NEGATIVE Final         Influenza A by PCR NEGATIVE NEGATIVE Final   Influenza B by PCR NEGATIVE NEGATIVE Final         _______________________________________________________ ER Provider Called:   Nephrology  Dr.Pattel They Recommend admit to medicine  Will see in AM    _______________________________________________________ ER Provider Called:   orthopedics Dr. Sammuel Hines They Recommend admit to medicine  Will see in AM   _______________________________________________ Hospitalist was called for admission for hypotensive episode  The following Work up has been ordered so far:  Orders Placed This Encounter  Procedures   DG Shoulder Right   Basic metabolic panel   CBC with Differential   Orthostatic vital signs   Consult to orthopedic surgery   Consult for Unassigned Medical Admission   Consult to nephrology   CBG monitoring, ED   EKG 12-Lead   EKG 12-Lead     Following Medications were ordered in  ER: Medications  fentaNYL (SUBLIMAZE) injection 50 mcg (50 mcg Intravenous Given 08/15/21 1517)  sodium chloride 0.9 % bolus 500 mL (0 mLs Intravenous Stopped 08/15/21 1839)  fentaNYL (SUBLIMAZE) injection 50 mcg (50 mcg Intravenous Given 08/15/21 1842)     OTHER Significant initial  Findings:  labs showing:    Recent  Labs  Lab 08/15/21 1522  NA 140  K 3.9  CO2 35*  GLUCOSE 87  BUN 22*  CREATININE 6.85*  CALCIUM 9.6    Cr   stable,   Lab Results  Component Value Date   CREATININE 6.85 (H) 08/15/2021   CREATININE 12.53 (H) 11/14/2020   CREATININE 11.67 (H) 11/13/2020    No results for input(s): AST, ALT, ALKPHOS, BILITOT, PROT, ALBUMIN in the last 168 hours. Lab Results  Component Value Date   CALCIUM 9.6 08/15/2021   PHOS 9.2 (H) 11/14/2020          Plt: Lab Results  Component Value Date   PLT 188 08/15/2021    Recent Labs  Lab 08/15/21 1522  WBC 11.3*  NEUTROABS 9.6*  HGB 11.4*  HCT 35.0*  MCV 97.8  PLT 188    HG/HCT  stable,       Component Value Date/Time   HGB 11.4 (L) 08/15/2021 1522   HCT 35.0 (L) 08/15/2021 1522   MCV 97.8 08/15/2021 1522         Cultures:    Component Value Date/Time   SDES  11/09/2020 1316    URINE, CLEAN CATCH Performed at [facility], [place] [address], [place], [place] [address]    SPECREQUEST  11/09/2020 1316    NONE Performed at [facility], [place] [address], [place], [place] [address]    CULT (A) 11/09/2020 1316    <10,000 COLONIES/mL INSIGNIFICANT GROWTH Performed at [facility] [address] [place], [place] [address]    REPTSTATUS 11/11/2020 FINAL 11/09/2020 1316     Radiological Exams on Admission: DG Shoulder Right  Result Date: 08/15/2021 CLINICAL DATA:  fall, R shoulder pain EXAM: RIGHT SHOULDER - 2+ VIEW COMPARISON:  None. FINDINGS: There is cortical regularity surrounding the right humeral head with a suspected fracture line visible only on the  axillary Y-view. Findings suggest a minimally displaced fracture of the proximal humerus. However definite characterization is suboptimal on provided radiographs. IMPRESSION: High suspicion for minimally displaced fracture of the proximal humerus. A CT of the right shoulder is recommended for more definite characterization. Electronically Signed   By: Albin Felling M.D.   On: 08/15/2021 16:09   _______________________________________________________________________________________________________ Latest  Blood pressure (!) 85/65, pulse 68, temperature 97.6 F (36.4 C), temperature source Oral, resp. rate 19, height 5\' 5"  (1.651 m), weight 91.2 kg, SpO2 97 %.   Review of Systems:    Pertinent positives include:  fatigue, syncope dizziness,    Constitutional:  No weight loss, night sweats, Fevers, chills,  weight loss  HEENT:  No headaches, Difficulty swallowing,Tooth/dental problems,Sore throat,  No sneezing, itching, ear ache, nasal congestion, post nasal drip,  Cardio-vascular:  No chest pain, Orthopnea, PND, anasarca,palpitations.no Bilateral lower extremity swelling  GI:  No heartburn, indigestion, abdominal pain, nausea, vomiting, diarrhea, change in bowel habits, loss of appetite, melena, blood in stool, hematemesis Resp:  no shortness of breath at rest. No dyspnea on exertion, No excess mucus, no productive cough, No non-productive cough, No coughing up of blood.No change in color of mucus.No wheezing. Skin:  no rash or lesions. No jaundice GU:  no dysuria, change in color of urine, no urgency or frequency. No straining to urinate.  No flank pain.  Musculoskeletal:  No joint pain or no joint swelling. No decreased range of motion. No back pain.  Psych:  No change in mood or affect. No depression or anxiety. No memory loss.  Neuro:  no localizing neurological complaints, no tingling, no weakness, no double vision, no gait abnormality, no slurred speech, no confusion  All systems  reviewed and apart from [place] all are negative _______________________________________________________________________________________________ Past Medical History:   Past Medical History:  Diagnosis Date   Arthritis    "knees" (08/08/2017)   Brain aneurysm    ruptured brain aneurysm, treated surgically, was very sick, 01-29-02, "almost died"   CKD (chronic kidney disease) stage 4, GFR 15-29 ml/min (HCC)    Gout    High cholesterol    Hypertension    Seizures ([place])    "because of scaring on brain tissue after OR for ruptured aneursym; on daily RX; last sz was in 07/2017" (08/08/2017)   Wears dentures       Past Surgical History:  Procedure Laterality Date   AMPUTATION Right 08/08/2017   Procedure: AMPUTATION right tumb;  Surgeon: Milly Jakob, MD;  Location: [place];  Service: Orthopedics;  Laterality: Right;   AMPUTATION FINGER Right 08/08/2017   thumb   AV FISTULA PLACEMENT Left 03/23/2020   Procedure: LEFT ARM ARTERIOVENOUS (AV) FISTULA CREATION;  Surgeon: Waynetta Sandy, MD;  Location: [place];  Service: Vascular;  Laterality: Left;   CEREBRAL ANEURYSM REPAIR  01/29/2002   [employer]   COLONOSCOPY     FISTULA SUPERFICIALIZATION Left 05/30/2020   Procedure: LEFT BRACHIOCEPHALIC Transposition of FISTULA .;  Surgeon: Waynetta Sandy, MD;  Location: [place];  Service: Vascular;  Laterality: Left;   IR PERC TUN PERIT CATH WO PORT S&I /IMAG  11/14/2020   IR US GUIDE VASC ACCESS RIGHT  11/14/2020   LAPAROSCOPIC NEPHRECTOMY Left 11/10/2020   Procedure: LEFT LAPAROSCOPIC RADICAL NEPHRECTOMY;  Surgeon: Ardis Hughs, MD;  Location: [facility];  Service: Urology;  Laterality: Left;   MULTIPLE TOOTH EXTRACTIONS     ROBOTIC ASSITED PARTIAL NEPHRECTOMY Right 12/05/2017   Procedure: XI ROBOTIC ASSITED LAPAROSCOPIC RIGHT PARTIAL NEPHRECTOMY;  Surgeon: Ardis Hughs, MD;  Location: [facility];  Service: Urology;  Laterality: Right;   VARICOSE VEIN SURGERY Bilateral    legs    Social  History:  Ambulatory   cane,    reports that he has been smoking cigarettes. He has never used smokeless tobacco. He reports that he does not drink alcohol and does not use drugs.   Family History:    Family History  Problem Relation Age of Onset   Renal Cyst Other    ______________________________________________________________________________________________ Allergies: Allergies  Allergen Reactions   Ace Inhibitors Swelling    *Lotension    Zonisamide Rash     Prior to Admission medications   Medication Sig Start Date End Date Taking? Authorizing Provider  HYDROcodone-acetaminophen (NORCO/VICODIN) 5-325 MG tablet Take 1 tablet by mouth every 6 (six) hours as needed for severe pain. 08/15/21  Yes Truddie Hidden, MD  allopurinol (ZYLOPRIM) 100 MG tablet Take 100 mg by mouth 2 (two) times daily. 11/27/18   [provider]  amLODipine (NORVASC) 10 MG tablet Take 10 mg by mouth daily.    [provider]  atenolol (TENORMIN) 100 MG tablet Take 100 mg by mouth daily.    [provider]  atorvastatin (LIPITOR) 40 MG tablet Take 40 mg by mouth daily.     [provider]  calcitRIOL (ROCALTROL) 0.25 MCG capsule Take 0.25 mcg by mouth daily. 12/18/18   [provider]  Cholecalciferol (VITAMIN D-3) 125 MCG (5000 UT) TABS Take 5,000 mg by mouth daily.    [provider]  ferrous sulfate 325 (65 FE) MG tablet Take 325 mg by mouth daily with breakfast.    [provider]  furosemide (LASIX) 40 MG tablet Take 40 mg by mouth 2 (two) times daily.  11/29/19   [provider]  levETIRAcetam (KEPPRA) 500 MG tablet Take 500 mg by mouth 2 (two) times daily. 12/08/18   [provider]    ___________________________________________________________________________________________________ Physical Exam: Vitals with BMI 08/15/2021 08/15/2021 08/15/2021  Height - - -  Weight - - -  BMI - - -  Systolic 85 70 85  Diastolic  65 49 58  Pulse 68 67 80     1. General:  in No  Acute distress   Chronically ill   -appearing 2. Psychological: Alert and  Oriented 3. Head/ENT:    Dry Mucous Membranes                          Head Non traumatic, neck supple                          Normal  Dentition 4. SKIN: normal  Skin turgor,  Skin clean Dry and intact no rash 5. Heart: Regular rate and rhythm no  Murmur, no Rub or gallop 6. Lungs:  Clear to auscultation bilaterally, no wheezes or crackles   7. Abdomen: Soft,  non-tender, Non distended    obese  bowel sounds present 8. Lower extremities: no clubbing, cyanosis, no  edema 9. Neurologically Grossly intact, moving all 4 extremities equally   10. MSK: Normal range of motion    Chart has been reviewed  ______________________________________________________________________________________________  Assessment/Plan  60 y.o. male with medical history significant of  ESRD on HD MWF, HLD, hx of Seizure DO, history of brain aneurysm   Admitted for syncope due to hypotension  Present on Admission:  Syncope -    obtain CE, monitor on tele and obtain carotid dopplers, Echo, hold BP meds    Essential hypertension - hold BP meds will need adjust home meds   Seizure disorder -  restart home medication   ESRD (end stage renal disease) ([place]) -nephrology is aware no indication for emergent hemodialysis tonight   Dyslipidemia -chronic stable restart home medications when able to tolerate   Hypotension post hemodialysis allow permissive hypertension.  Patient reports his blood pressure has significantly went down after starting hemodialysis.  Probably will need to have adjustment of his home medications may need to initiate midodrine  Proximal humerus fracture appreciate Orthopedics consult Supportive management nonoperative  Other plan as per orders.  DVT prophylaxis:  SCD        Code Status:    Code Status: Prior FULL CODE    per patient   I had personally  discussed CODE STATUS with patient      Family Communication:   Family not at  Bedside    Disposition Plan:        To home once workup is complete and patient is stable   Following barriers for discharge:                            Electrolytes corrected                               Anemia corrected  Pain controlled with PO medications                                          Will need consultants to evaluate patient prior to discharge                       Would benefit from PT/OT eval prior to DC  Ordered                                       Consults called: Nephrology aware orthopedics aware  Admission status:  ED Disposition     ED Disposition  [place]: [place] [[id number]]  Level of Care: Progressive [102]  Admit to Progressive based on following criteria: CARDIOVASCULAR & THORACIC of moderate stability with acute coronary syndrome symptoms/low risk myocardial infarction/hypertensive urgency/arrhythmias/heart failure potentially compromising stability and stable post cardiovascular intervention patients.  May place patient in observation at [facility] or [place] if equivalent level of care is available:: No  Covid Evaluation: Asymptomatic Screening Protocol (No Symptoms)  Diagnosis: Syncope [206001]  Admitting Physician: Toy Baker [[id number]]  Attending Physician: Toy Baker [[id number]]           Obs      Level of care   progressive tele indefinitely please discontinue once patient no longer qualifies COVID-19 Labs    Lab Results  Component Value Date   [place] 08/15/2021     Precautions: admitted as   Covid Negative      PPE: Used by the provider:   N95  eye Goggles,  Gloves     Tu Bayle 08/16/2021, 2:39 AM    [facility]     after 2 AM please page floor coverage PA If 7AM-7PM, please contact the day team taking  care of the patient using [email or web address]   Patient was evaluated in the context of the global COVID-19 pandemic, which necessitated consideration that the patient might be at risk for infection with the SARS-CoV-2 virus that causes COVID-19. Institutional protocols and algorithms that pertain to the evaluation of patients at risk for COVID-19 are in a state of rapid change based on information released by regulatory bodies including the CDC and federal and state organizations. These policies and algorithms were followed during the patient's care.

## 2021-08-15 NOTE — ED Notes (Signed)
Orthostatic Vitals Preformed  Pt tolerated activity well.  Pt denies feeling lightheaded, dizzy or faint.  Pt reports feeling weaker than baseline.   Results reported to EDP

## 2021-08-15 NOTE — ED Triage Notes (Signed)
Patient brought in via EMS from home, Patient is a dialysis patient MWF. Went this morning, but has had low BP for the past week that his PCP is aware of and was told to cut back meds. Patient got out of chair to answer door today and had syncopal episode hitting right shoulder on floor. Now c/o shoulder pain.

## 2021-08-15 NOTE — Discharge Instructions (Addendum)
     Discharge Instructions    Attending Surgeon: Vanetta Mulders, MD Office Phone Number: [phone]   Diagnosis:    Right proximal humeral fracture  Discharge Plan:    Activity:  Keep sling  in place untl your follow up visit in Physical Therapy. Non weight bearing right arm

## 2021-08-16 ENCOUNTER — Encounter (HOSPITAL_COMMUNITY): Payer: Self-pay | Admitting: Internal Medicine

## 2021-08-16 ENCOUNTER — Observation Stay (HOSPITAL_COMMUNITY): Payer: Medicare HMO

## 2021-08-16 ENCOUNTER — Inpatient Hospital Stay (HOSPITAL_COMMUNITY): Payer: Medicare HMO

## 2021-08-16 DIAGNOSIS — D631 Anemia in chronic kidney disease: Secondary | ICD-10-CM | POA: Diagnosis present

## 2021-08-16 DIAGNOSIS — W010XXA Fall on same level from slipping, tripping and stumbling without subsequent striking against object, initial encounter: Secondary | ICD-10-CM | POA: Diagnosis present

## 2021-08-16 DIAGNOSIS — S42201A Unspecified fracture of upper end of right humerus, initial encounter for closed fracture: Secondary | ICD-10-CM | POA: Diagnosis not present

## 2021-08-16 DIAGNOSIS — E875 Hyperkalemia: Secondary | ICD-10-CM | POA: Diagnosis present

## 2021-08-16 DIAGNOSIS — I959 Hypotension, unspecified: Secondary | ICD-10-CM | POA: Diagnosis present

## 2021-08-16 DIAGNOSIS — Z6833 Body mass index (BMI) 33.0-33.9, adult: Secondary | ICD-10-CM | POA: Diagnosis not present

## 2021-08-16 DIAGNOSIS — Z79899 Other long term (current) drug therapy: Secondary | ICD-10-CM | POA: Diagnosis not present

## 2021-08-16 DIAGNOSIS — Z20822 Contact with and (suspected) exposure to covid-19: Secondary | ICD-10-CM | POA: Diagnosis present

## 2021-08-16 DIAGNOSIS — M1990 Unspecified osteoarthritis, unspecified site: Secondary | ICD-10-CM | POA: Diagnosis present

## 2021-08-16 DIAGNOSIS — N2581 Secondary hyperparathyroidism of renal origin: Secondary | ICD-10-CM | POA: Diagnosis present

## 2021-08-16 DIAGNOSIS — S42291A Other displaced fracture of upper end of right humerus, initial encounter for closed fracture: Secondary | ICD-10-CM

## 2021-08-16 DIAGNOSIS — G9389 Other specified disorders of brain: Secondary | ICD-10-CM | POA: Diagnosis present

## 2021-08-16 DIAGNOSIS — I951 Orthostatic hypotension: Secondary | ICD-10-CM | POA: Diagnosis present

## 2021-08-16 DIAGNOSIS — M898X9 Other specified disorders of bone, unspecified site: Secondary | ICD-10-CM | POA: Diagnosis present

## 2021-08-16 DIAGNOSIS — F17210 Nicotine dependence, cigarettes, uncomplicated: Secondary | ICD-10-CM | POA: Diagnosis present

## 2021-08-16 DIAGNOSIS — R55 Syncope and collapse: Secondary | ICD-10-CM

## 2021-08-16 DIAGNOSIS — R32 Unspecified urinary incontinence: Secondary | ICD-10-CM | POA: Diagnosis present

## 2021-08-16 DIAGNOSIS — G40909 Epilepsy, unspecified, not intractable, without status epilepticus: Secondary | ICD-10-CM | POA: Diagnosis present

## 2021-08-16 DIAGNOSIS — D72829 Elevated white blood cell count, unspecified: Secondary | ICD-10-CM | POA: Diagnosis present

## 2021-08-16 DIAGNOSIS — I120 Hypertensive chronic kidney disease with stage 5 chronic kidney disease or end stage renal disease: Secondary | ICD-10-CM | POA: Diagnosis present

## 2021-08-16 DIAGNOSIS — I953 Hypotension of hemodialysis: Secondary | ICD-10-CM

## 2021-08-16 DIAGNOSIS — I10 Essential (primary) hypertension: Secondary | ICD-10-CM | POA: Diagnosis not present

## 2021-08-16 DIAGNOSIS — S42214A Unspecified nondisplaced fracture of surgical neck of right humerus, initial encounter for closed fracture: Secondary | ICD-10-CM | POA: Diagnosis present

## 2021-08-16 DIAGNOSIS — R159 Full incontinence of feces: Secondary | ICD-10-CM | POA: Diagnosis present

## 2021-08-16 DIAGNOSIS — Z905 Acquired absence of kidney: Secondary | ICD-10-CM | POA: Diagnosis not present

## 2021-08-16 DIAGNOSIS — N186 End stage renal disease: Secondary | ICD-10-CM | POA: Diagnosis present

## 2021-08-16 DIAGNOSIS — I371 Nonrheumatic pulmonary valve insufficiency: Secondary | ICD-10-CM | POA: Diagnosis present

## 2021-08-16 DIAGNOSIS — E669 Obesity, unspecified: Secondary | ICD-10-CM | POA: Diagnosis present

## 2021-08-16 DIAGNOSIS — Z992 Dependence on renal dialysis: Secondary | ICD-10-CM | POA: Diagnosis not present

## 2021-08-16 DIAGNOSIS — E7800 Pure hypercholesterolemia, unspecified: Secondary | ICD-10-CM | POA: Diagnosis present

## 2021-08-16 LAB — TSH: TSH: 0.634 u[IU]/mL (ref 0.350–4.500)

## 2021-08-16 LAB — ECHOCARDIOGRAM COMPLETE
Area-P 1/2: 3.5 cm2
Height: 65 in
S' Lateral: 3.5 cm
Weight: 3216 oz

## 2021-08-16 LAB — COMPREHENSIVE METABOLIC PANEL
ALT: 34 U/L (ref 0–44)
AST: 28 U/L (ref 15–41)
Albumin: 3.4 g/dL — ABNORMAL LOW (ref 3.5–5.0)
Alkaline Phosphatase: 118 U/L (ref 38–126)
Anion gap: 9 (ref 5–15)
BUN: 31 mg/dL — ABNORMAL HIGH (ref 6–20)
CO2: 31 mmol/L (ref 22–32)
Calcium: 8.9 mg/dL (ref 8.9–10.3)
Chloride: 95 mmol/L — ABNORMAL LOW (ref 98–111)
Creatinine, Ser: 8.35 mg/dL — ABNORMAL HIGH (ref 0.61–1.24)
GFR, Estimated: 7 mL/min — ABNORMAL LOW (ref >60)
Glucose, Bld: 123 mg/dL — ABNORMAL HIGH (ref 70–99)
Potassium: 3.8 mmol/L (ref 3.5–5.1)
Sodium: 135 mmol/L (ref 135–145)
Total Bilirubin: 0.9 mg/dL (ref 0.3–1.2)
Total Protein: 6.1 g/dL — ABNORMAL LOW (ref 6.5–8.1)

## 2021-08-16 LAB — CBC WITH DIFFERENTIAL/PLATELET
Abs Immature Granulocytes: 0.02 10*3/uL (ref 0.00–0.07)
Basophils Absolute: 0 10*3/uL (ref 0.0–0.1)
Basophils Relative: 0 %
Eosinophils Absolute: 0.2 10*3/uL (ref 0.0–0.5)
Eosinophils Relative: 3 %
HCT: 30 % — ABNORMAL LOW (ref 39.0–52.0)
Hemoglobin: 10.1 g/dL — ABNORMAL LOW (ref 13.0–17.0)
Immature Granulocytes: 0 %
Lymphocytes Relative: 12 %
Lymphs Abs: 0.8 10*3/uL (ref 0.7–4.0)
MCH: 32.8 pg (ref 26.0–34.0)
MCHC: 33.7 g/dL (ref 30.0–36.0)
MCV: 97.4 fL (ref 80.0–100.0)
Monocytes Absolute: 0.6 10*3/uL (ref 0.1–1.0)
Monocytes Relative: 9 %
Neutro Abs: 5 10*3/uL (ref 1.7–7.7)
Neutrophils Relative %: 76 %
Platelets: 140 10*3/uL — ABNORMAL LOW (ref 150–400)
RBC: 3.08 MIL/uL — ABNORMAL LOW (ref 4.22–5.81)
RDW: 14.2 % (ref 11.5–15.5)
WBC: 6.6 10*3/uL (ref 4.0–10.5)
nRBC: 0 % (ref 0.0–0.2)

## 2021-08-16 LAB — HIV ANTIBODY (ROUTINE TESTING W REFLEX): HIV Screen 4th Generation wRfx: NONREACTIVE

## 2021-08-16 LAB — PHOSPHORUS: Phosphorus: 3.8 mg/dL (ref 2.5–4.6)

## 2021-08-16 LAB — MAGNESIUM: Magnesium: 2.1 mg/dL (ref 1.7–2.4)

## 2021-08-16 LAB — GLUCOSE, CAPILLARY: Glucose-Capillary: 120 mg/dL — ABNORMAL HIGH (ref 70–99)

## 2021-08-16 MED ORDER — HYDROCODONE-ACETAMINOPHEN 5-325 MG PO TABS
1.0000 | ORAL_TABLET | ORAL | Status: DC | PRN
  Administered 2021-08-16: 2 via ORAL
  Administered 2021-08-16 (×2): 1 via ORAL
  Administered 2021-08-17 – 2021-08-18 (×4): 2 via ORAL
  Filled 2021-08-16 (×3): qty 2
  Filled 2021-08-16: qty 1
  Filled 2021-08-16: qty 2
  Filled 2021-08-16: qty 1
  Filled 2021-08-16: qty 2

## 2021-08-16 MED ORDER — SODIUM CHLORIDE 0.9% FLUSH: 3.0000 mL | INTRAVENOUS | Status: DC | PRN

## 2021-08-16 MED ORDER — LEVETIRACETAM 500 MG PO TABS
500.0000 mg | ORAL_TABLET | Freq: Two times a day (BID) | ORAL | Status: DC
  Administered 2021-08-16 – 2021-08-19 (×7): 500 mg via ORAL
  Filled 2021-08-16 (×7): qty 1

## 2021-08-16 MED ORDER — ALLOPURINOL 100 MG PO TABS
100.0000 mg | ORAL_TABLET | Freq: Two times a day (BID) | ORAL | Status: DC
  Administered 2021-08-16 – 2021-08-19 (×7): 100 mg via ORAL
  Filled 2021-08-16 (×7): qty 1

## 2021-08-16 MED ORDER — SODIUM CHLORIDE 0.9 % IV SOLN: 250.0000 mL | INTRAVENOUS | Status: DC | PRN

## 2021-08-16 MED ORDER — MIDODRINE HCL 5 MG PO TABS
5.0000 mg | ORAL_TABLET | Freq: Three times a day (TID) | ORAL | Status: DC
  Administered 2021-08-16 – 2021-08-17 (×3): 5 mg via ORAL
  Filled 2021-08-16 (×3): qty 1

## 2021-08-16 MED ORDER — TRAMADOL HCL 50 MG PO TABS: 50.0000 mg | ORAL_TABLET | Freq: Four times a day (QID) | ORAL | Status: DC | PRN

## 2021-08-16 MED ORDER — ACETAMINOPHEN 325 MG PO TABS: 650.0000 mg | ORAL_TABLET | Freq: Four times a day (QID) | ORAL | Status: DC | PRN

## 2021-08-16 MED ORDER — ACETAMINOPHEN 650 MG RE SUPP: 650.0000 mg | Freq: Four times a day (QID) | RECTAL | Status: DC | PRN

## 2021-08-16 MED ORDER — ATORVASTATIN CALCIUM 40 MG PO TABS
40.0000 mg | ORAL_TABLET | Freq: Every day | ORAL | Status: DC
  Administered 2021-08-16 – 2021-08-19 (×4): 40 mg via ORAL
  Filled 2021-08-16 (×4): qty 1

## 2021-08-16 MED ORDER — SODIUM CHLORIDE 0.9% FLUSH
3.0000 mL | Freq: Two times a day (BID) | INTRAVENOUS | Status: DC
  Administered 2021-08-16 – 2021-08-18 (×6): 3 mL via INTRAVENOUS

## 2021-08-16 NOTE — Progress Notes (Signed)
PT Cancellation Note  Patient Details Name: Bayden Gil MRN: [id number] DOB: 07-30-1961   Cancelled Treatment:    Reason Eval/Treat Not Completed: PT screened, no needs identified, will sign off Per OT, pt with no skilled PT needs. Will sign off. If needs change, please re-consult.   Reuel Derby, PT, DPT  Acute Rehabilitation Services  Pager: [phone] Office: [phone]    Rudean Hitt 08/16/2021, 1:45 PM

## 2021-08-16 NOTE — TOC CAGE-AID Note (Signed)
Transition of Care [facility]) - CAGE-AID Screening   Patient Details  Name: Logan Decker MRN: [id number] Date of Birth: 30-Jan-1961  [facility]) CM/SW Contact:    Army Melia, RN Phone Number: [phone] 08/16/2021, 11:22 PM  CAGE-AID Screening:    Have You Ever Felt You Ought to Cut Down on Your Drinking or Drug Use?: No Have People Annoyed You By Critizing Your Drinking Or Drug Use?: No Have You Felt Bad Or Guilty About Your Drinking Or Drug Use?: No Have You Ever Had a Drink or Used Drugs First Thing In The Morning to Steady Your Nerves or to Get Rid of a Hangover?: No CAGE-AID Score: 0  Substance Abuse Education Offered: No (no alcohol or drug use)

## 2021-08-16 NOTE — Progress Notes (Signed)
  Echocardiogram 2D Echocardiogram has been performed.  Darlina Sicilian M 08/16/2021, 8:51 AM

## 2021-08-16 NOTE — Evaluation (Addendum)
Occupational Therapy Evaluation Patient Details Name: Logan Decker MRN: [id number] DOB: [birth year] Today's Date: 08/16/2021   History of Present Illness 60 y.o. 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  Presented with a fall due to syncope, sustained a R humeral fracture.  Non operative per Ortho.   Clinical Impression   Patient admitted for the diagnosis above.  PTA he lives alone, with occasional community mobility from family.  Generally he walks in the community, and is able to care for all his ADL/IADL.  Primary deficit is pain to his R arm.  OT recommends home with occasional assist from family, and follow up according to ortho MD.  OT educated regarding sling management, UB dressing/bathing technique, positioning of R UE to support his r arm in lying/sitting.  R UE HEP: AA/AROM elbow distal to R arm.  No further OT needs in the acute setting.          Recommendations for follow up therapy are one component of a multi-disciplinary discharge planning process, led by the attending physician.  Recommendations may be updated based on patient status, additional functional criteria and insurance authorization.   Follow Up Recommendations  Follow physician's recommendations for discharge plan and follow up therapies    Assistance Recommended at Discharge Intermittent Supervision/Assistance  Functional Status Assessment  Patient has had a recent decline in their functional status and demonstrates the ability to make significant improvements in function in a reasonable and predictable amount of time.  Equipment Recommendations  None recommended by OT    Recommendations for Other Services       Precautions / Restrictions Precautions Precautions: Fall Restrictions Weight Bearing Restrictions: Yes RUE Weight Bearing: Non weight bearing Other Position/Activity Restrictions: sling      Mobility Bed Mobility Overal bed  mobility: Modified Independent                  Transfers Overall transfer level: Modified independent                        Balance Overall balance assessment: Mild deficits observed, not formally tested                                         ADL either performed or assessed with clinical judgement   ADL       Grooming: Set up;Standing           Upper Body Dressing : Minimal assistance;Standing Upper Body Dressing Details (indicate cue type and reason): pain Lower Body Dressing: Sit to/from stand;Modified independent   Toilet Transfer: Modified Independent   Toileting- Clothing Manipulation and Hygiene: Modified independent         General ADL Comments: OT educated regarding sling management, UB dressing techniques to minimize AROM and pain to R shoulder.     Vision Baseline Vision/History: 1 Wears glasses Patient Visual Report: No change from baseline       Perception Perception Perception: Not tested   Praxis Praxis Praxis: Not tested    Pertinent Vitals/Pain Pain Assessment: Faces Faces Pain Scale: Hurts even more Pain Location: R arm with mobility Pain Descriptors / Indicators: Sharp;Shooting Pain Intervention(s): Monitored during session     Hand Dominance Right   Extremity/Trunk Assessment Upper Extremity Assessment Upper Extremity Assessment: RUE deficits/detail RUE: Unable to fully  assess due to pain;Shoulder pain with ROM;Unable to fully assess due to immobilization RUE Sensation: WNL RUE Coordination: WNL   Lower Extremity Assessment Lower Extremity Assessment: Overall WFL for tasks assessed   Cervical / Trunk Assessment Cervical / Trunk Assessment: Normal   Communication Communication Communication: No difficulties   Cognition Arousal/Alertness: Awake/alert Behavior During Therapy: WFL for tasks assessed/performed Overall Cognitive Status: Within Functional Limits for tasks assessed                                  General Comments: Has ST Memory deficts per patient s/p CVA     General Comments  patient with 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    Exercises General Exercises - Upper Extremity Elbow Flexion: AAROM;Right;10 reps;Seated Elbow Extension: AAROM;Right;10 reps;Seated Wrist Flexion: AROM;20 reps;Right;Seated Wrist Extension: AROM;20 reps;Right;Seated Digit Composite Flexion: AROM;15 reps;Seated Composite Extension: AROM;15 reps;Seated   Shoulder Instructions      Home Living Family/patient expects to be discharged to:: Private residence Living Arrangements: Alone Available Help at Discharge: Family;Available PRN/intermittently Type of Home: House Home Access: Level entry     Home Layout: One level     Bathroom Shower/Tub: Teacher, early years/pre: Standard Bathroom Accessibility: Yes How Accessible: Accessible via walker Home Equipment: Grab bars - tub/shower;Cane - single point;Tub bench;Rolling Walker (2 wheels)          Prior Functioning/Environment Prior Level of Function : Independent/Modified Independent             Mobility Comments: community ambulator, does not drive anymore. ADLs Comments: Independent with ADL/IADL        OT Problem List: Pain      OT Treatment/Interventions:      OT Goals(Current goals can be found in the care plan section) Acute Rehab OT Goals Patient Stated Goal: I'm ready to go home, my nephew can help me. OT Goal Formulation: With patient Time For Goal Achievement: 08/16/21 Potential to Achieve Goals: Good  OT Frequency:     Barriers to D/C:  None noted          Co-evaluation              AM-PAC OT "6 Clicks" Daily Activity     Outcome Measure Help from another person eating meals?: None Help from another person taking care of personal grooming?: None Help from another person toileting, which includes using toliet, bedpan, or urinal?:  None Help from another person bathing (including washing, rinsing, drying)?: A Little Help from another person to put on and taking off regular upper body clothing?: A Little Help from another person to put on and taking off regular lower body clothing?: None 6 Click Score: 22   End of Session Equipment Utilized During Treatment: Gait belt Nurse Communication: Mobility status  Activity Tolerance: Patient tolerated treatment well Patient left: in bed;with call bell/phone within reach  OT Visit Diagnosis: Pain Pain - Right/Left: Right Pain - part of body: Arm                Time: 1751-0258 OT Time Calculation (min): 26 min Charges:  OT General Charges $OT Visit: 1 Visit OT Evaluation $OT Eval Moderate Complexity: 1 Mod OT Treatments $Self Care/Home Management : 8-22 mins  08/16/2021  RP, OTR/L  Acute Rehabilitation Services  Office:  [phone]   Metta Clines 08/16/2021, 9:53 AM

## 2021-08-16 NOTE — Progress Notes (Addendum)
[facility]  PROGRESS NOTE  Logan Decker QIO:[id number] DOB: [birth year] DOA: 08/15/2021 PCP: Robert Bellow, PA-C   Brief HPI:   60 year old male with a history of ESRD on HD MWF, hyperlipidemia, history of seizure disorder, history of brain aneurysm presented with fall due to syncope.  Patient went for hemodialysis yesterday he was sitting talking to some friends, felt lightheaded and sat back down, try to walk to his room, fainted and passed out hitting the right shoulder.  He could not get up due to pain, urinated and defecated on himself.  Felt diaphoretic.  Denies hitting head.  He has been taking his seizure medications.  Feels that this was not a seizure.  He has history of hypertension after hemodialysis.  Has been trying to get off some of the BP medications.    Subjective   Patient seen, has sling in the right shoulder.  Was seen by orthopedic surgery.  No surgical intervention at this time.   Assessment/Plan:     Syncope -Likely due to orthostatic hypotension -Echocardiogram obtained showed EF 60 to 65%, moderate left ventricular hypertrophy of the basal septal segment -Troponin was 9; denies chest pain -Continue to monitor on telemetry  Orthostatic hypotension -Likely cause of syncope -Blood pressure has been soft -Home medications including amlodipine, atenolol, furosemide are on hold -We will start midodrine 5 mg 3 times daily  Seizure disorder -Patient takes Keppra for seizure disorder -He did defecate and urinate on himself when he passed out, though he did not have postictal period -I will obtain EEG to rule out underlying seizure  Hypertension -Home medication including amlodipine, atenolol, furosemide are on hold -These medications will need to be adjusted before discharge  ESRD on HD, MWF -Nephrology has been consulted  Nondisplaced right proximal humerus fracture -Nonoperative management per orthopedics -Right shoulder in  sling -Nonweightbearing on right arm -Follow-up orthopedics as outpatient       Scheduled medications:    allopurinol  100 mg Oral BID   atorvastatin  40 mg Oral Daily   levETIRAcetam  500 mg Oral BID   midodrine  5 mg Oral TID WC   sodium chloride flush  3 mL Intravenous Q12H     Data Reviewed:   CBG:  Recent Labs  Lab 08/15/21 1806  GLUCAP 114*    SpO2: 95 %    Vitals:   08/16/21 1000 08/16/21 1150 08/16/21 1415 08/16/21 1532  BP: (!) 102/28 (!) 105/58 102/67 119/72  Pulse: 67 (!) 58 66 65  Resp:  16 18 20   Temp:  98 F (36.7 C) 98.2 F (36.8 C) 99.3 F (37.4 C)  TempSrc:  Oral Oral Oral  SpO2: 94% 93% 97% 95%  Weight:    93.5 kg  Height:    5\' 6"  (1.676 m)     Intake/Output Summary (Last 24 hours) at 08/16/2021 1630 Last data filed at 08/15/2021 1839 Gross per 24 hour  Intake 500 ml  Output --  Net 500 ml    10/25 1901 - 10/27 0700 In: 500  Out: -   Filed Weights   08/15/21 1459 08/16/21 1532  Weight: 91.2 kg 93.5 kg    Data Reviewed: Basic Metabolic Panel: Recent Labs  Lab 08/15/21 1522 08/16/21 0520  NA 140 135  K 3.9 3.8  CL 92* 95*  CO2 35* 31  GLUCOSE 87 123*  BUN 22* 31*  CREATININE 6.85* 8.35*  CALCIUM 9.6 8.9  MG  --  2.1  PHOS  --  3.8   Liver Function Tests: Recent Labs  Lab 08/16/21 0520  AST 28  ALT 34  ALKPHOS 118  BILITOT 0.9  PROT 6.1*  ALBUMIN 3.4*   No results for input(s): LIPASE, AMYLASE in the last 168 hours. No results for input(s): AMMONIA in the last 168 hours. CBC: Recent Labs  Lab 08/15/21 1522 08/16/21 0520  WBC 11.3* 6.6  NEUTROABS 9.6* 5.0  HGB 11.4* 10.1*  HCT 35.0* 30.0*  MCV 97.8 97.4  PLT 188 140*   Cardiac Enzymes: No results for input(s): CKTOTAL, CKMB, CKMBINDEX, TROPONINI in the last 168 hours. BNP (last 3 results) No results for input(s): BNP in the last 8760 hours.  ProBNP (last 3 results) No results for input(s): PROBNP in the last 8760 hours.  CBG: Recent Labs   Lab 08/15/21 1806  GLUCAP 114*       Radiology Reports  DG Shoulder Right  Result Date: 08/15/2021 CLINICAL DATA:  fall, R shoulder pain EXAM: RIGHT SHOULDER - 2+ VIEW COMPARISON:  None. FINDINGS: There is cortical regularity surrounding the right humeral head with a suspected fracture line visible only on the axillary Y-view. Findings suggest a minimally displaced fracture of the proximal humerus. However definite characterization is suboptimal on provided radiographs. IMPRESSION: High suspicion for minimally displaced fracture of the proximal humerus. A CT of the right shoulder is recommended for more definite characterization. Electronically Signed   By: Albin Felling M.D.   On: 08/15/2021 16:09   CT Shoulder Right Wo Contrast  Result Date: 08/15/2021 CLINICAL DATA:  Concern for humeral fracture. EXAM: CT OF THE UPPER RIGHT EXTREMITY WITHOUT CONTRAST TECHNIQUE: Multidetector CT imaging of the upper right extremity was performed according to the standard protocol. COMPARISON:  Radiograph dated 08/15/2021. FINDINGS: Bones/Joint/Cartilage There is a nondisplaced fracture of the humeral neck. No dislocation. There is mild joint effusion and edema. No fluid collection. Ligaments Suboptimally assessed by CT. Muscles and Tendons No intramuscular hematoma or fluid collection. Soft tissues No fluid collection. IMPRESSION: Nondisplaced fracture of the humeral neck. No dislocation. Electronically Signed   By: Anner Crete M.D.   On: 08/15/2021 20:10   ECHOCARDIOGRAM COMPLETE  Result Date: 08/16/2021    ECHOCARDIOGRAM REPORT   Patient Name:   Logan Decker Date of Exam: 08/16/2021 Medical Rec #:  [id number]  Height:       65.0 in Accession #:    [id number] Weight:       201.0 lb Date of Birth:  [birth year] BSA:          1.982 m Patient Age:    9 years   BP:           97/65 mmHg Patient Gender: M          HR:           61 bpm. Exam Location:  Inpatient Procedure: 2D Echo, 3D Echo, Cardiac Doppler,  Color Doppler and Strain Analysis Indications:    Syncope R55  History:        Patient has no prior history of Echocardiogram examinations.                 Risk Factors:Hypertension and Dyslipidemia. Chronic kidney                 disease, anemia. Gout.  Sonographer:    Darlina Sicilian RDCS Referring Phys: [place]  1. Left ventricular ejection fraction, by estimation, is 60 to 65%. Left ventricular ejection fraction by 3D volume is 61 %.  The left ventricle has normal function. The left ventricle has no regional wall motion abnormalities. There is moderate left ventricular hypertrophy of the basal-septal segment. Left ventricular diastolic parameters were normal. The average left ventricular global longitudinal strain is -21.7 %. The global longitudinal strain is normal.  2. Right ventricular systolic function is normal. The right ventricular size is normal. Tricuspid regurgitation signal is inadequate for assessing PA pressure.  3. The mitral valve is normal in structure. Trivial mitral valve regurgitation. No evidence of mitral stenosis.  4. The aortic valve is normal in structure. Aortic valve regurgitation is not visualized. No aortic stenosis is present.  5. Aortic dilatation noted. There is borderline dilatation of the ascending aorta, measuring 37 mm.  6. The inferior vena cava is normal in size with greater than 50% respiratory variability, suggesting right atrial pressure of 3 mmHg. FINDINGS  Left Ventricle: Left ventricular ejection fraction, by estimation, is 60 to 65%. Left ventricular ejection fraction by 3D volume is 61 %. The left ventricle has normal function. The left ventricle has no regional wall motion abnormalities. The average left ventricular global longitudinal strain is -21.7 %. The global longitudinal strain is normal. The left ventricular internal cavity size was normal in size. There is moderate left ventricular hypertrophy of the basal-septal segment. Left  ventricular diastolic parameters were normal. Normal left ventricular filling pressure. Right Ventricle: The right ventricular size is normal. No increase in right ventricular wall thickness. Right ventricular systolic function is normal. Tricuspid regurgitation signal is inadequate for assessing PA pressure. Left Atrium: Left atrial size was normal in size. Right Atrium: Right atrial size was normal in size. Pericardium: There is no evidence of pericardial effusion. Mitral Valve: The mitral valve is normal in structure. Trivial mitral valve regurgitation. No evidence of mitral valve stenosis. Tricuspid Valve: The tricuspid valve is normal in structure. Tricuspid valve regurgitation is not demonstrated. No evidence of tricuspid stenosis. Aortic Valve: The aortic valve is normal in structure. Aortic valve regurgitation is not visualized. No aortic stenosis is present. Pulmonic Valve: The pulmonic valve was normal in structure. Pulmonic valve regurgitation is mild. No evidence of pulmonic stenosis. Aorta: Aortic dilatation noted. There is borderline dilatation of the ascending aorta, measuring 37 mm. Venous: The inferior vena cava is normal in size with greater than 50% respiratory variability, suggesting right atrial pressure of 3 mmHg. IAS/Shunts: No atrial level shunt detected by color flow Doppler.  LEFT VENTRICLE PLAX 2D LVIDd:         5.50 cm         Diastology LVIDs:         3.50 cm         LV e' medial:    8.10 cm/s LV PW:         1.10 cm         LV E/e' medial:  9.2 LV IVS:        1.20 cm         LV e' lateral:   10.10 cm/s LVOT diam:     2.20 cm         LV E/e' lateral: 7.4 LV SV:         82 LV SV Index:   41              2D LVOT Area:     3.80 cm        Longitudinal  Strain                                2D Strain GLS  -21.4 %                                (A2C):                                2D Strain GLS  -23.5 %                                (A3C):                                 2D Strain GLS  -20.2 %                                (A4C):                                2D Strain GLS  -21.7 %                                Avg:                                 3D Volume EF                                LV 3D EF:    Left                                             ventricul                                             ar                                             ejection                                             fraction                                             by 3D  volume is                                             61 %.                                 3D Volume EF:                                3D EF:        61 %                                LV EDV:       168 ml                                LV ESV:       66 ml                                LV SV:        101 ml RIGHT VENTRICLE RV S prime:     19.00 cm/s TAPSE (M-mode): 2.6 cm LEFT ATRIUM             Index        RIGHT ATRIUM           Index LA diam:        4.90 cm 2.47 cm/m   RA Area:     14.60 cm LA Vol (A2C):   33.7 ml 17.00 ml/m  RA Volume:   30.70 ml  15.49 ml/m LA Vol (A4C):   58.5 ml 29.51 ml/m LA Biplane Vol: 45.1 ml 22.75 ml/m  AORTIC VALVE LVOT Vmax:   106.00 cm/s LVOT Vmean:  70.500 cm/s LVOT VTI:    0.215 m  AORTA Ao Root diam: 3.50 cm Ao Asc diam:  3.70 cm MITRAL VALVE MV Area (PHT): 3.50 cm    SHUNTS MV Decel Time: 217 msec    Systemic VTI:  0.22 m MV E velocity: 74.30 cm/s  Systemic Diam: 2.20 cm MV A velocity: 79.00 cm/s MV E/A ratio:  0.94 Fransico Him MD Electronically signed by Fransico Him MD Signature Date/Time: 08/16/2021/9:11:55 AM    Final    VAS US CAROTID  Result Date: 08/16/2021 Carotid Arterial Duplex Study Patient Name:  DONACIANO RANGE  Date of Exam:   08/16/2021 Medical Rec #: [id number]   Accession #:    [id number] Date of Birth: 02-10-1961  Patient Gender: M Patient Age:   43 years Exam Location:  [facility] Procedure:      VAS  US CAROTID Referring Phys: Nyoka Lint DOUTOVA --------------------------------------------------------------------------------  Indications:       Syncope and Hypotension after dialysis. Risk Factors:      Hypertension, hyperlipidemia. Other Factors:     ESRD. Limitations        Today's exam was limited due to Broken shoulder/sling. Comparison Study:  No prior study Performing Technologist: Sharion Dove RVS  Examination Guidelines: A complete evaluation includes B-mode imaging, spectral Doppler, color Doppler, and power Doppler as needed of  all accessible portions of each vessel. Bilateral testing is considered an integral part of a complete examination. Limited examinations for reoccurring indications may be performed as noted.  Right Carotid Findings: +----------+--------+--------+--------+------------------+------------------+           PSV cm/sEDV cm/sStenosisPlaque DescriptionComments           +----------+--------+--------+--------+------------------+------------------+ CCA Prox  104     14                                intimal thickening +----------+--------+--------+--------+------------------+------------------+ CCA Distal100     20                                intimal thickening +----------+--------+--------+--------+------------------+------------------+ ICA Prox  65      14                                                   +----------+--------+--------+--------+------------------+------------------+ ICA Distal55      13                                                   +----------+--------+--------+--------+------------------+------------------+ ECA       109     6                                                    +----------+--------+--------+--------+------------------+------------------+ +----------+--------+-------+------------+-------------------+           PSV cm/sEDV cmsDescribe    Arm Pressure (mmHG)  +----------+--------+-------+------------+-------------------+ Subclavian               Not assessed                    +----------+--------+-------+------------+-------------------+ +---------+--------+--+--------+-+ VertebralPSV cm/s30EDV cm/s6 +---------+--------+--+--------+-+  Left Carotid Findings: +----------+--------+--------+--------+------------------+------------------+           PSV cm/sEDV cm/sStenosisPlaque DescriptionComments           +----------+--------+--------+--------+------------------+------------------+ CCA Prox  95      23                                intimal thickening +----------+--------+--------+--------+------------------+------------------+ CCA Distal105     13                                intimal thickening +----------+--------+--------+--------+------------------+------------------+ ICA Prox  65      23                                                   +----------+--------+--------+--------+------------------+------------------+ ICA Distal72      24                                                   +----------+--------+--------+--------+------------------+------------------+  ECA       114     16                                                   +----------+--------+--------+--------+------------------+------------------+ +----------+--------+--------+------------+-------------------+           PSV cm/sEDV cm/sDescribe    Arm Pressure (mmHG) +----------+--------+--------+------------+-------------------+ Subclavian                Not assessed                    +----------+--------+--------+------------+-------------------+ +---------+--------+--+--------+--+ VertebralPSV cm/s44EDV cm/s16 +---------+--------+--+--------+--+   Summary: Right Carotid: The extracranial vessels were near-normal with only minimal wall                thickening or plaque. Left Carotid: The extracranial vessels were near-normal with  only minimal wall               thickening or plaque. Vertebrals:  Bilateral vertebral arteries demonstrate antegrade flow. Subclavians: Not assessed secondary to sling. *See table(s) above for measurements and observations.     Preliminary        Antibiotics: Anti-infectives (From admission, onward)    None         DVT prophylaxis: SCDs  Code Status: Full code  Family Communication: No family at bedside   Consultants: Nephrology  Procedures:     Objective    Physical Examination:   General-appears in no acute distress Heart-S1-S2, regular, no murmur auscultated Lungs-clear to auscultation bilaterally, no wheezing or crackles auscultated Abdomen-soft, nontender, no organomegaly Extremities-right shoulder in sling Neuro-alert, oriented x3, no focal deficit noted  Status is: Inpatient  Dispo: The patient is from: Home              Anticipated d/c is to: Home              Anticipated d/c date is: 08/17/2021              Patient currently not stable for discharge  Barrier to discharge-ongoing evaluation for syncope, questionable seizure  COVID-19 Labs  No results for input(s): DDIMER, FERRITIN, LDH, CRP in the last 72 hours.  Lab Results  Component Value Date   SARSCOV2NAA NEGATIVE 08/15/2021   SARSCOV2NAA NEGATIVE 11/09/2020   [place] NEGATIVE 05/29/2020   [place] NEGATIVE 03/21/2020            Recent Results (from the past 240 hour(s))  Resp Panel by RT-PCR (Flu A&B, Covid) Nasopharyngeal Swab     Status: None   Collection Time: 08/15/21 10:30 PM   Specimen: Nasopharyngeal Swab; Nasopharyngeal(NP) swabs in vial transport medium  Result Value Ref Range Status   SARS Coronavirus 2 by RT PCR NEGATIVE NEGATIVE Final    Comment: (NOTE) SARS-CoV-2 target nucleic acids are NOT DETECTED.  The SARS-CoV-2 RNA is generally detectable in upper respiratory specimens during the acute phase of infection. The lowest concentration of SARS-CoV-2  viral copies this assay can detect is 138 copies/mL. A negative result does not preclude SARS-Cov-2 infection and should not be used as the sole basis for treatment or other patient management decisions. A negative result may occur with  improper specimen collection/handling, submission of specimen other than nasopharyngeal swab, presence of viral mutation(s) within the areas targeted by this assay, and inadequate number of viral copies(<138 copies/mL). A negative result  must be combined with clinical observations, patient history, and epidemiological information. The expected result is Negative.  Fact Sheet for Patients:  EntrepreneurPulse.com.au  Fact Sheet for Healthcare Providers:  IncredibleEmployment.be  This test is no t yet approved or cleared by the [place] FDA and  has been authorized for detection and/or diagnosis of SARS-CoV-2 by FDA under an Emergency Use Authorization (EUA). This EUA will remain  in effect (meaning this test can be used) for the duration of the COVID-19 declaration under Section 564(b)(1) of the Act, 21 U.S.C.section 360bbb-3(b)(1), unless the authorization is terminated  or revoked sooner.       Influenza A by PCR NEGATIVE NEGATIVE Final   Influenza B by PCR NEGATIVE NEGATIVE Final    Comment: (NOTE) The Xpert Xpress SARS-CoV-2/FLU/RSV plus assay is intended as an aid in the diagnosis of influenza from Nasopharyngeal swab specimens and should not be used as a sole basis for treatment. Nasal washings and aspirates are unacceptable for Xpert Xpress SARS-CoV-2/FLU/RSV testing.  Fact Sheet for Patients: EntrepreneurPulse.com.au  Fact Sheet for Healthcare Providers: IncredibleEmployment.be  This test is not yet approved or cleared by the [place] FDA and has been authorized for detection and/or diagnosis of SARS-CoV-2 by FDA under an Emergency Use Authorization  (EUA). This EUA will remain in effect (meaning this test can be used) for the duration of the COVID-19 declaration under Section 564(b)(1) of the Act, 21 U.S.C. section 360bbb-3(b)(1), unless the authorization is terminated or revoked.  Performed at [facility], [place] [address] [place], [place] [address]     [facility] If 7PM-7AM, please contact night-coverage at [email or web address], Office  [phone]   08/16/2021, 4:30 PM  LOS: 0 days

## 2021-08-16 NOTE — ED Notes (Signed)
Breakfast orders placed 

## 2021-08-16 NOTE — Progress Notes (Signed)
VASCULAR LAB    Carotid duplex has been performed.  See CV proc for preliminary results.   Braycen Burandt, RVT 08/16/2021, 8:38 AM

## 2021-08-16 NOTE — ED Notes (Signed)
PT at bedside.

## 2021-08-16 NOTE — Consult Note (Signed)
ORTHOPAEDIC CONSULTATION  REQUESTING PHYSICIAN: Oswald Hillock, MD  Chief Complaint: Right shoulder pain  HPI: Logan Decker is a 60 y.o. male who presents with right proximal humerus fracture after a fall following dialysis.  He denies any previous injuries to the shoulder.  He states that he was getting up and lost his balance and fell directly onto the right shoulder.  Denies any other site of pain or injury.  He is on dialysis from polycystic kidney disease.  Denies any numbness in the hand or digits  Past Medical History:  Diagnosis Date   Arthritis    "knees" (08/08/2017)   Brain aneurysm    ruptured brain aneurysm, treated surgically, was very sick, 17-Jan-2002, "almost died"   CKD (chronic kidney disease) stage 4, GFR 15-29 ml/min (HCC)    Gout    High cholesterol    Hypertension    Seizures ([place])    "because of scaring on brain tissue after OR for ruptured aneursym; on daily RX; last sz was in 07/2017" (08/08/2017)   Wears dentures    Past Surgical History:  Procedure Laterality Date   AMPUTATION Right 08/08/2017   Procedure: AMPUTATION right tumb;  Surgeon: Milly Jakob, MD;  Location: [place];  Service: Orthopedics;  Laterality: Right;   AMPUTATION FINGER Right 08/08/2017   thumb   AV FISTULA PLACEMENT Left 03/23/2020   Procedure: LEFT ARM ARTERIOVENOUS (AV) FISTULA CREATION;  Surgeon: Waynetta Sandy, MD;  Location: [place];  Service: Vascular;  Laterality: Left;   CEREBRAL ANEURYSM REPAIR  Jan 17, 2002   [employer]   COLONOSCOPY     FISTULA SUPERFICIALIZATION Left 05/30/2020   Procedure: LEFT BRACHIOCEPHALIC Transposition of FISTULA .;  Surgeon: Waynetta Sandy, MD;  Location: [place];  Service: Vascular;  Laterality: Left;   IR PERC TUN PERIT CATH WO PORT S&I /IMAG  11/14/2020   IR US GUIDE VASC ACCESS RIGHT  11/14/2020   LAPAROSCOPIC NEPHRECTOMY Left 11/10/2020   Procedure: LEFT LAPAROSCOPIC RADICAL NEPHRECTOMY;  Surgeon: Ardis Hughs, MD;  Location: [facility];   Service: Urology;  Laterality: Left;   MULTIPLE TOOTH EXTRACTIONS     ROBOTIC ASSITED PARTIAL NEPHRECTOMY Right 12/05/2017   Procedure: XI ROBOTIC ASSITED LAPAROSCOPIC RIGHT PARTIAL NEPHRECTOMY;  Surgeon: Ardis Hughs, MD;  Location: [facility];  Service: Urology;  Laterality: Right;   VARICOSE VEIN SURGERY Bilateral    legs   Social History   Socioeconomic History   Marital status: Divorced    Spouse name: Not on file   Number of children: Not on file   Years of education: Not on file   Highest education level: Not on file  Occupational History   Not on file  Tobacco Use   Smoking status: Some Days    Years: 35.00    Types: Cigarettes   Smokeless tobacco: Never  Vaping Use   Vaping Use: Never used  Substance and Sexual Activity   Alcohol use: No   Drug use: No   Sexual activity: Never  Other Topics Concern   Not on file  Social History Narrative   Not on file   Social Determinants of Health   Financial Resource Strain: Not on file  Food Insecurity: Not on file  Transportation Needs: Not on file  Physical Activity: Not on file  Stress: Not on file  Social Connections: Not on file   Family History  Problem Relation Age of Onset   Renal Cyst Other    - negative except otherwise stated  in the family history section Allergies  Allergen Reactions   Ace Inhibitors Swelling    *Lotension    Zonisamide Rash   Prior to Admission medications   Medication Sig Start Date End Date Taking? Authorizing Provider  allopurinol (ZYLOPRIM) 100 MG tablet Take 100 mg by mouth 2 (two) times daily. 11/27/18  Yes [provider]  amLODipine (NORVASC) 10 MG tablet Take 10 mg by mouth daily.   Yes [provider]  atenolol (TENORMIN) 100 MG tablet Take 100 mg by mouth daily.   Yes [provider]  atorvastatin (LIPITOR) 40 MG tablet Take 40 mg by mouth daily.    Yes [provider]  Cholecalciferol (VITAMIN D-3) 125 MCG (5000 UT) TABS Take 5,000 mg  by mouth daily.   Yes [provider]  ferrous sulfate 325 (65 FE) MG tablet Take 325 mg by mouth See admin instructions. Take one tablet by mouth on Mon, wed and Fridays per patient   Yes [provider]  furosemide (LASIX) 40 MG tablet Take 40 mg by mouth 2 (two) times daily.  11/29/19  Yes [provider]  HYDROcodone-acetaminophen (NORCO/VICODIN) 5-325 MG tablet Take 1 tablet by mouth every 6 (six) hours as needed for severe pain. 08/15/21  Yes Truddie Hidden, MD  levETIRAcetam (KEPPRA) 500 MG tablet Take 500 mg by mouth 2 (two) times daily. 12/08/18  Yes [provider]  lidocaine (LIDODERM) 5 % Place 1 patch onto the skin daily. 08/15/21  Yes [provider]  omega-3 acid ethyl esters (LOVAZA) 1 g capsule Take 1 g by mouth daily.   Yes [provider]   DG Shoulder Right  Result Date: 08/15/2021 CLINICAL DATA:  fall, R shoulder pain EXAM: RIGHT SHOULDER - 2+ VIEW COMPARISON:  None. FINDINGS: There is cortical regularity surrounding the right humeral head with a suspected fracture line visible only on the axillary Y-view. Findings suggest a minimally displaced fracture of the proximal humerus. However definite characterization is suboptimal on provided radiographs. IMPRESSION: High suspicion for minimally displaced fracture of the proximal humerus. A CT of the right shoulder is recommended for more definite characterization. Electronically Signed   By: Albin Felling M.D.   On: 08/15/2021 16:09   CT Shoulder Right Wo Contrast  Result Date: 08/15/2021 CLINICAL DATA:  Concern for humeral fracture. EXAM: CT OF THE UPPER RIGHT EXTREMITY WITHOUT CONTRAST TECHNIQUE: Multidetector CT imaging of the upper right extremity was performed according to the standard protocol. COMPARISON:  Radiograph dated 08/15/2021. FINDINGS: Bones/Joint/Cartilage There is a nondisplaced fracture of the humeral neck. No dislocation. There is mild joint effusion and  edema. No fluid collection. Ligaments Suboptimally assessed by CT. Muscles and Tendons No intramuscular hematoma or fluid collection. Soft tissues No fluid collection. IMPRESSION: Nondisplaced fracture of the humeral neck. No dislocation. Electronically Signed   By: Anner Crete M.D.   On: 08/15/2021 20:10   VAS US CAROTID  Result Date: 08/16/2021 Carotid Arterial Duplex Study Patient Name:  Logan Decker  Date of Exam:   08/16/2021 Medical Rec #: [id number]   Accession #:    [id number] Date of Birth: August 27, 1961  Patient Gender: M Patient Age:   82 years Exam Location:  [facility] Procedure:      VAS US CAROTID Referring Phys: Nyoka Lint DOUTOVA --------------------------------------------------------------------------------  Indications:       Syncope and Hypotension after dialysis. Risk Factors:      Hypertension, hyperlipidemia. Other Factors:     ESRD. Limitations  Today's exam was limited due to Broken shoulder/sling. Comparison Study:  No prior study Performing Technologist: Sharion Dove RVS  Examination Guidelines: A complete evaluation includes B-mode imaging, spectral Doppler, color Doppler, and power Doppler as needed of all accessible portions of each vessel. Bilateral testing is considered an integral part of a complete examination. Limited examinations for reoccurring indications may be performed as noted.  Right Carotid Findings: +----------+--------+--------+--------+------------------+------------------+           PSV cm/sEDV cm/sStenosisPlaque DescriptionComments           +----------+--------+--------+--------+------------------+------------------+ CCA Prox  104     14                                intimal thickening +----------+--------+--------+--------+------------------+------------------+ CCA Distal100     20                                intimal thickening +----------+--------+--------+--------+------------------+------------------+ ICA Prox  65       14                                                   +----------+--------+--------+--------+------------------+------------------+ ICA Distal55      13                                                   +----------+--------+--------+--------+------------------+------------------+ ECA       109     6                                                    +----------+--------+--------+--------+------------------+------------------+ +----------+--------+-------+------------+-------------------+           PSV cm/sEDV cmsDescribe    Arm Pressure (mmHG) +----------+--------+-------+------------+-------------------+ Subclavian               Not assessed                    +----------+--------+-------+------------+-------------------+ +---------+--------+--+--------+-+ VertebralPSV cm/s30EDV cm/s6 +---------+--------+--+--------+-+  Left Carotid Findings: +----------+--------+--------+--------+------------------+------------------+           PSV cm/sEDV cm/sStenosisPlaque DescriptionComments           +----------+--------+--------+--------+------------------+------------------+ CCA Prox  95      23                                intimal thickening +----------+--------+--------+--------+------------------+------------------+ CCA Distal105     13                                intimal thickening +----------+--------+--------+--------+------------------+------------------+ ICA Prox  65      23                                                   +----------+--------+--------+--------+------------------+------------------+  ICA Distal72      24                                                   +----------+--------+--------+--------+------------------+------------------+ ECA       114     16                                                   +----------+--------+--------+--------+------------------+------------------+  +----------+--------+--------+------------+-------------------+           PSV cm/sEDV cm/sDescribe    Arm Pressure (mmHG) +----------+--------+--------+------------+-------------------+ Subclavian                Not assessed                    +----------+--------+--------+------------+-------------------+ +---------+--------+--+--------+--+ VertebralPSV cm/s44EDV cm/s16 +---------+--------+--+--------+--+   Summary: Right Carotid: The extracranial vessels were near-normal with only minimal wall                thickening or plaque. Left Carotid: The extracranial vessels were near-normal with only minimal wall               thickening or plaque. Vertebrals:  Bilateral vertebral arteries demonstrate antegrade flow. Subclavians: Not assessed secondary to sling. *See table(s) above for measurements and observations.     Preliminary      Positive ROS: All other systems have been reviewed and were otherwise negative with the exception of those mentioned in the HPI and as above.  Physical Exam: General: No acute distress Cardiovascular: No pedal edema Respiratory: No cyanosis, no use of accessory musculature GI: No organomegaly, abdomen is soft and non-tender Skin: No lesions in the area of chief complaint Neurologic: Sensation intact distally Psychiatric: Patient is at baseline mood and affect Lymphatic: No axillary or cervical lymphadenopathy  MUSCULOSKELETAL:  Right shoulder is tender to palpation diffusely.  Arm is in a sling.  Is able to extend and flex at the wrist is able to use his interosseous muscles in the hand.  Sensation is intact in all distributions of the right hand, 2+ radial pulse  Independent Imaging Review: X-ray and CT right shoulder: There is a nondisplaced fracture of the humeral neck approximately  Assessment: 60 year old male with nondisplaced right proximal humerus fracture.  Overall advised that with nonoperative management this should heal very well and  allow for significant return to activity and function.  I have advised that ultimately will take 4 weeks for the fracture to heal to the point where we will allow him to mobilize more.  For the time being he will remain in the sling.  Plan: Plan for sling nonweightbearing on the right arm he may follow-up with my office, follow-up information updated.  Thank you for the consult and the opportunity to see Mr. Logan Eddings, MD [facility] 8:48 AM

## 2021-08-16 NOTE — Progress Notes (Signed)
EEG complete - results pending 

## 2021-08-17 ENCOUNTER — Encounter (HOSPITAL_COMMUNITY): Payer: Self-pay | Admitting: Family Medicine

## 2021-08-17 DIAGNOSIS — G40909 Epilepsy, unspecified, not intractable, without status epilepticus: Secondary | ICD-10-CM | POA: Diagnosis not present

## 2021-08-17 DIAGNOSIS — R55 Syncope and collapse: Secondary | ICD-10-CM

## 2021-08-17 DIAGNOSIS — I10 Essential (primary) hypertension: Secondary | ICD-10-CM | POA: Diagnosis not present

## 2021-08-17 LAB — HEPATITIS B SURFACE ANTIBODY,QUALITATIVE: Hep B S Ab: REACTIVE — AB

## 2021-08-17 LAB — CBC
HCT: 31.5 % — ABNORMAL LOW (ref 39.0–52.0)
HCT: 29.7 % — ABNORMAL LOW (ref 39.0–52.0)
Hemoglobin: 10.8 g/dL — ABNORMAL LOW (ref 13.0–17.0)
Hemoglobin: 10.2 g/dL — ABNORMAL LOW (ref 13.0–17.0)
MCH: 32 pg (ref 26.0–34.0)
MCH: 32.3 pg (ref 26.0–34.0)
MCHC: 34.3 g/dL (ref 30.0–36.0)
MCHC: 34.3 g/dL (ref 30.0–36.0)
MCV: 93.5 fL (ref 80.0–100.0)
MCV: 94 fL (ref 80.0–100.0)
Platelets: 162 10*3/uL (ref 150–400)
Platelets: 177 10*3/uL (ref 150–400)
RBC: 3.37 MIL/uL — ABNORMAL LOW (ref 4.22–5.81)
RBC: 3.16 MIL/uL — ABNORMAL LOW (ref 4.22–5.81)
RDW: 13.7 % (ref 11.5–15.5)
RDW: 13.6 % (ref 11.5–15.5)
WBC: 8.5 10*3/uL (ref 4.0–10.5)
WBC: 7.9 10*3/uL (ref 4.0–10.5)
nRBC: 0 % (ref 0.0–0.2)
nRBC: 0 % (ref 0.0–0.2)

## 2021-08-17 LAB — POTASSIUM
Potassium: 4.1 mmol/L (ref 3.5–5.1)
Potassium: 4.2 mmol/L (ref 3.5–5.1)
Potassium: 4.2 mmol/L (ref 3.5–5.1)
Potassium: 3.9 mmol/L (ref 3.5–5.1)

## 2021-08-17 LAB — RENAL FUNCTION PANEL
Albumin: 3.3 g/dL — ABNORMAL LOW (ref 3.5–5.0)
Anion gap: 12 (ref 5–15)
BUN: 56 mg/dL — ABNORMAL HIGH (ref 6–20)
CO2: 31 mmol/L (ref 22–32)
Calcium: 8.9 mg/dL (ref 8.9–10.3)
Chloride: 89 mmol/L — ABNORMAL LOW (ref 98–111)
Creatinine, Ser: 12 mg/dL — ABNORMAL HIGH (ref 0.61–1.24)
GFR, Estimated: 4 mL/min — ABNORMAL LOW (ref >60)
Glucose, Bld: 90 mg/dL (ref 70–99)
Phosphorus: 5.5 mg/dL — ABNORMAL HIGH (ref 2.5–4.6)
Potassium: 4.2 mmol/L (ref 3.5–5.1)
Sodium: 132 mmol/L — ABNORMAL LOW (ref 135–145)

## 2021-08-17 LAB — BASIC METABOLIC PANEL
Anion gap: 13 (ref 5–15)
BUN: 53 mg/dL — ABNORMAL HIGH (ref 6–20)
CO2: 28 mmol/L (ref 22–32)
Calcium: 9.3 mg/dL (ref 8.9–10.3)
Chloride: 94 mmol/L — ABNORMAL LOW (ref 98–111)
Creatinine, Ser: 11.48 mg/dL — ABNORMAL HIGH (ref 0.61–1.24)
GFR, Estimated: 5 mL/min — ABNORMAL LOW (ref >60)
Glucose, Bld: 92 mg/dL (ref 70–99)
Potassium: 6.4 mmol/L (ref 3.5–5.1)
Sodium: 135 mmol/L (ref 135–145)

## 2021-08-17 LAB — MRSA NEXT GEN BY PCR, NASAL: MRSA by PCR Next Gen: NOT DETECTED

## 2021-08-17 LAB — HEPATITIS B SURFACE ANTIGEN: Hepatitis B Surface Ag: NONREACTIVE

## 2021-08-17 MED ORDER — DEXTROSE 50 % IV SOLN
1.0000 | Freq: Once | INTRAVENOUS | Status: AC
  Administered 2021-08-17: 50 mL via INTRAVENOUS
  Filled 2021-08-17: qty 50

## 2021-08-17 MED ORDER — SODIUM CHLORIDE 0.9 % IV SOLN: 100.0000 mL | INTRAVENOUS | Status: DC | PRN

## 2021-08-17 MED ORDER — SUCROFERRIC OXYHYDROXIDE 500 MG PO CHEW
1000.0000 mg | CHEWABLE_TABLET | Freq: Three times a day (TID) | ORAL | Status: DC
  Administered 2021-08-17 – 2021-08-19 (×6): 1000 mg via ORAL
  Filled 2021-08-17 (×8): qty 2

## 2021-08-17 MED ORDER — MIDODRINE HCL 5 MG PO TABS
ORAL_TABLET | ORAL | Status: AC
  Administered 2021-08-17: 5 mg via ORAL
  Filled 2021-08-17: qty 1

## 2021-08-17 MED ORDER — CHLORHEXIDINE GLUCONATE CLOTH 2 % EX PADS
6.0000 | MEDICATED_PAD | Freq: Every day | CUTANEOUS | Status: DC
  Administered 2021-08-17 – 2021-08-19 (×3): 6 via TOPICAL

## 2021-08-17 MED ORDER — DOXERCALCIFEROL 4 MCG/2ML IV SOLN
3.0000 ug | INTRAVENOUS | Status: DC
  Filled 2021-08-17: qty 2

## 2021-08-17 MED ORDER — ALTEPLASE 2 MG IJ SOLR: 2.0000 mg | Freq: Once | INTRAMUSCULAR | Status: DC | PRN

## 2021-08-17 MED ORDER — DOXERCALCIFEROL 4 MCG/2ML IV SOLN
INTRAVENOUS | Status: AC
  Administered 2021-08-17: 3 ug via INTRAVENOUS
  Filled 2021-08-17: qty 2

## 2021-08-17 MED ORDER — PENTAFLUOROPROP-TETRAFLUOROETH EX AERO: 1.0000 "application " | INHALATION_SPRAY | CUTANEOUS | Status: DC | PRN

## 2021-08-17 MED ORDER — HEPARIN SODIUM (PORCINE) 1000 UNIT/ML DIALYSIS: 1000.0000 [IU] | INTRAMUSCULAR | Status: DC | PRN

## 2021-08-17 MED ORDER — INSULIN ASPART 100 UNIT/ML IV SOLN
5.0000 [IU] | Freq: Once | INTRAVENOUS | Status: AC
  Administered 2021-08-17: 5 [IU] via INTRAVENOUS

## 2021-08-17 MED ORDER — LIDOCAINE HCL (PF) 1 % IJ SOLN: 5.0000 mL | INTRAMUSCULAR | Status: DC | PRN

## 2021-08-17 MED ORDER — LIDOCAINE-PRILOCAINE 2.5-2.5 % EX CREA: 1.0000 "application " | TOPICAL_CREAM | CUTANEOUS | Status: DC | PRN

## 2021-08-17 NOTE — Progress Notes (Signed)
PT Cancellation Note  Patient Details Name: Logan Decker MRN: [id number] DOB: [birth year]   Cancelled Treatment:    Reason Eval/Treat Not Completed: Patient at procedure or test/unavailable (HD). Will follow-up for PT Evaluation as schedule permits.  Mabeline Caras, PT, DPT Acute Rehabilitation Services  Pager [phone] Office [place] 08/17/2021, 10:38 AM

## 2021-08-17 NOTE — Progress Notes (Signed)
Critical Lab: Potasium 6.4, not hemolyzed.  Arletha Pili, MD, to advise, received no new orders.

## 2021-08-17 NOTE — Plan of Care (Signed)
  Problem: Education: Goal: Knowledge of General Education information will improve Description: Including pain rating scale, medication(s)/side effects and non-pharmacologic comfort measures Outcome: Progressing   Problem: Health Behavior/Discharge Planning: Goal: Ability to manage health-related needs will improve Outcome: Progressing   Problem: Clinical Measurements: Goal: Ability to maintain clinical measurements within normal limits will improve Outcome: Progressing Goal: Will remain free from infection Outcome: Progressing Goal: Diagnostic test results will improve Outcome: Progressing Goal: Respiratory complications will improve Outcome: Progressing Goal: Cardiovascular complication will be avoided Outcome: Progressing   Problem: Pain Managment: Goal: General experience of comfort will improve Outcome: Progressing   Problem: Elimination: Goal: Will not experience complications related to bowel motility Outcome: Progressing Goal: Will not experience complications related to urinary retention Outcome: Progressing   Problem: Coping: Goal: Level of anxiety will decrease Outcome: Progressing

## 2021-08-17 NOTE — Procedures (Signed)
Patient Name: Logan Decker  MRN: [id number]  Epilepsy Attending: Lora Havens  Referring Physician/Provider: Dr Eleonore Chiquito Date: 08/16/2021 Duration: 24.30 mins  Patient history: 60 year old male with history of epilepsy on Keppra who presented after an episode of syncope which was associated with bowel and bladder incontinence.  EEG to evaluate for seizure.  Level of alertness: Awake  AEDs during EEG study:   Technical aspects: This EEG study was done with scalp electrodes positioned according to the 10-20 International system of electrode placement. Electrical activity was acquired at a sampling rate of 500Hz  and reviewed with a high frequency filter of 70Hz  and a low frequency filter of 1Hz . EEG data were recorded continuously and digitally stored.   Description: The posterior dominant rhythm consists of 8 Hz activity of moderate voltage (25-35 uV) seen predominantly in posterior head regions, symmetric and reactive to eye opening and eye closing.  EEG showed continuous 3 to 6 Hz theta-delta slowing in right hemisphere, maximal right fronto-temporal region.  Hyperventilation and photic stimulation were not performed.     ABNORMALITY -Continuous slow, right hemisphere, maximal right fronto-temporal region  IMPRESSION: This study is suggestive of cortical dysfunction in right hemisphere, maximal right frontotemporal region likely secondary to underlying structural abnormality/ encephalomalacia. No seizures or definite epileptiform discharges were seen throughout the recording.  Lendy Dittrich Barbra Sarks

## 2021-08-17 NOTE — Progress Notes (Signed)
Hyperkalemia order panel placed: 1) EKG stat 2) check K Q4H 3) give insulin + dextrose now 4) will inform nephrology

## 2021-08-17 NOTE — Evaluation (Signed)
Physical Therapy Evaluation Patient Details Name: Logan Decker MRN: [id number] DOB: 03-31-1961 Today's Date: 08/17/2021  History of Present Illness  Pt is a 60 y.o. male admitted on 08/15/21 for a fall related to syncope. Sustained a R humeral fracture (non-operative, NWB). PMH includes ESRD on HD, HLD, seizure, and ruptured brain aneurysm (treated surgically).  Clinical Impression  Pt presents with functional deficits secondary to above. Pt lives with family who can help as needed. PTA, pt independent in mobility and ADLs. Today, pt limited by pain and inability to use R shoulder, decreased strength, and functional mobility. Pt required supervision while ambulating for safety and min guard for sit to/from stand. Pt would benefit from further acute care PT services to continue to address functional mobility and return to independent activities.      Orthostatic BPs Supine 113/69  Sitting 118/64  Standing 107/66  Standing after 2 min 109/74  Post-ambulation 119/73      Recommendations for follow up therapy are one component of a multi-disciplinary discharge planning process, led by the attending physician.  Recommendations may be updated based on patient status, additional functional criteria and insurance authorization.  Follow Up Recommendations No PT follow up    Assistance Recommended at Discharge Intermittent Supervision/Assistance  Functional Status Assessment Patient has had a recent decline in their functional status and demonstrates the ability to make significant improvements in function in a reasonable and predictable amount of time.  Equipment Recommendations  BSC    Recommendations for Other Services       Precautions / Restrictions Precautions Precautions: Fall Required Braces or Orthoses: Sling (on right arm) Restrictions Weight Bearing Restrictions: Yes RUE Weight Bearing: Non weight bearing      Mobility  Bed Mobility Overal bed mobility: Modified  Independent Bed Mobility: Supine to Sit     Supine to sit: Modified independent (Device/Increase time)     General bed mobility comments: Pt able to go supine to EOB with HOB elevated without use of hands, took increased time due to pain in R shoulder    Transfers Overall transfer level: Needs assistance Equipment used: None Transfers: Sit to/from Stand Sit to Stand: Supervision           General transfer comment: Pt stood from EOB with cues for hand placement; sat back into recliner and BSC over toilet, cues for alignment and hand placement. Primary limiting factor was pain in R shoulder.    Ambulation/Gait Ambulation/Gait assistance: Supervision Gait Distance (Feet): 48 Feet   Gait Pattern/deviations: Shuffle;Trunk flexed Gait velocity: decreased   General Gait Details: Pt walked from EOB to bathroom without DME, supervision for safety  Stairs            Wheelchair Mobility    Modified Rankin (Stroke Patients Only)       Balance Overall balance assessment: Mild deficits observed, not formally tested Sitting-balance support: No upper extremity supported Sitting balance-Leahy Scale: Fair Sitting balance - Comments: Pt able to static sit without use of UE   Standing balance support: No upper extremity supported Standing balance-Leahy Scale: Fair Standing balance comment: Pt able to static stand and ambulate without DME; did not challenge balance                             Pertinent Vitals/Pain Faces Pain Scale: Hurts even more Pain Location: R arm with mobility Pain Descriptors / Indicators: Grimacing;Guarding;Tender Pain Intervention(s): Repositioned;Monitored during session;Limited activity within patient's tolerance;Utilized relaxation  techniques    Home Living Family/patient expects to be discharged to:: Private residence Living Arrangements: Parent;Other (Comment) (Pt states he lives with his mother but she is currently in [place].  States his nephew is living with him currently.)   Type of Home: Apartment Home Access: Level entry       Home Layout: One level Home Equipment: Conservation officer, nature (2 wheels) Additional Comments: of note, pt providing different info than given during OT eval    Prior Function Prior Level of Function : Independent/Modified Independent                     Hand Dominance   Dominant Hand: Right    Extremity/Trunk Assessment   Upper Extremity Assessment Upper Extremity Assessment: Defer to OT evaluation    Lower Extremity Assessment Lower Extremity Assessment: Overall WFL for tasks assessed       Communication   Communication: No difficulties  Cognition Arousal/Alertness: Awake/alert Behavior During Therapy: Impulsive Overall Cognitive Status: No family/caregiver present to determine baseline cognitive functioning Area of Impairment: Attention;Memory;Following commands;Safety/judgement;Awareness;Problem solving                     Memory: Decreased short-term memory Following Commands: Follows one step commands consistently;Follows one step commands with increased time Safety/Judgement: Decreased awareness of safety   Problem Solving: Slow processing;Requires verbal cues General Comments: likely near baseline cogntion s/p CVA        General Comments      Exercises     Assessment/Plan    PT Assessment Patient needs continued PT services  PT Problem List Decreased strength;Decreased range of motion;Decreased activity tolerance;Decreased balance;Decreased knowledge of use of DME;Decreased safety awareness;Decreased cognition;Pain;Decreased mobility       PT Treatment Interventions Gait training;Functional mobility training;Therapeutic activities;Therapeutic exercise;Patient/family education;Balance training    PT Goals (Current goals can be found in the Care Plan section)  Acute Rehab PT Goals Patient Stated Goal: Pt wants to return home PT Goal  Formulation: With patient Time For Goal Achievement: 08/31/21 Potential to Achieve Goals: Good    Frequency Min 5X/week   Barriers to discharge        Co-evaluation               AM-PAC PT "6 Clicks" Mobility  Outcome Measure Help needed turning from your back to your side while in a flat bed without using bedrails?: A Little Help needed moving from lying on your back to sitting on the side of a flat bed without using bedrails?: A Little Help needed moving to and from a bed to a chair (including a wheelchair)?: A Little Help needed standing up from a chair using your arms (e.g., wheelchair or bedside chair)?: A Little Help needed to walk in hospital room?: A Little Help needed climbing 3-5 steps with a railing? : A Little 6 Click Score: 6    End of Session Equipment Utilized During Treatment: Gait belt;Other (comment) (sling (R arm)) Activity Tolerance: Patient limited by pain;Patient tolerated treatment well Patient left: in chair;with call bell/phone within reach Nurse Communication: Mobility status;Other (comment) (informed nurse tech pt was ready for wash-up) PT Visit Diagnosis: History of falling (Z91.81);Pain;Other abnormalities of gait and mobility (R26.89) Pain - Right/Left: Right Pain - part of body: Shoulder    Time: 2353-6144 PT Time Calculation (min) (ACUTE ONLY): 37 min   Charges:   PT Evaluation $PT Eval Moderate Complexity: 1 Mod  Brandon Melnick, SPT   Brandon Melnick 08/17/2021, 5:44 PM

## 2021-08-17 NOTE — Consult Note (Addendum)
[facility] Renal Consultation Note    Indication for Consultation:  Management of ESRD/hemodialysis, anemia, hypertension/volume, and secondary hyperparathyroidism.  HPI: Logan Decker is a 60 y.o. male with past medical history including ESRD on dialysis, hypertension, seizures, who was admitted 08/15/2021 after syncopal episode and fall.  Reports after dialysis, he was sitting with some friends, but became very lightheaded when he stood up.  He fell and hit his right shoulder and could not get up due to the pain.  He reports he has been having hypotension after dialysis.  X-ray did show a proximal humerus fracture and Ortho recommending nonoperative management.  Advised patient to remain in the sling for 4 weeks.  His AV fistula is on the same side as his fracture.  Current labs notable for white blood cell 8.5, hemoglobin 10.8, platelets 162.  Potassium earlier this morning was 6.4, given temporizing measures about 4.1.  BUN 53, creatinine 11.48, calcium 9.3.  BP meds are currently on hold, blood pressures remain somewhat soft but stable.  At present, patient has no dizziness, chest pain, palpitations, shortness of breath, abdominal pain, nausea, vomiting, diarrhea.  Denies peripheral edema.  He does feel like he has some extra fluid on today though. He typically removes 2.5-3.5L UF with HD and BP has been stable during dialysis but he reports dizziness after most dialysis sessions lately. Antihypertensives prior to admission: amlodipine 10mg  QHS, atenolol 100mg  QD, lasix 40mg  on non-HD days.    Past Medical History:  Diagnosis Date   Arthritis    "knees" (08/08/2017)   Brain aneurysm    ruptured brain aneurysm, treated surgically, was very sick, Jan 12, 2002, "almost died"   CKD (chronic kidney disease) stage 4, GFR 15-29 ml/min (HCC)    Gout    High cholesterol    Hypertension    Seizures ([place])    "because of scaring on brain tissue after OR for ruptured aneursym; on daily RX; last sz  was in 07/2017" (08/08/2017)   Wears dentures    Past Surgical History:  Procedure Laterality Date   AMPUTATION Right 08/08/2017   Procedure: AMPUTATION right tumb;  Surgeon: Milly Jakob, MD;  Location: [place];  Service: Orthopedics;  Laterality: Right;   AMPUTATION FINGER Right 08/08/2017   thumb   AV FISTULA PLACEMENT Left 03/23/2020   Procedure: LEFT ARM ARTERIOVENOUS (AV) FISTULA CREATION;  Surgeon: Waynetta Sandy, MD;  Location: [place];  Service: Vascular;  Laterality: Left;   CEREBRAL ANEURYSM REPAIR  01-12-02   [facility] [facility]   COLONOSCOPY     FISTULA SUPERFICIALIZATION Left 05/30/2020   Procedure: LEFT BRACHIOCEPHALIC Transposition of FISTULA .;  Surgeon: Waynetta Sandy, MD;  Location: [place];  Service: Vascular;  Laterality: Left;   IR PERC TUN PERIT CATH WO PORT S&I /IMAG  11/14/2020   IR US GUIDE VASC ACCESS RIGHT  11/14/2020   LAPAROSCOPIC NEPHRECTOMY Left 11/10/2020   Procedure: LEFT LAPAROSCOPIC RADICAL NEPHRECTOMY;  Surgeon: Ardis Hughs, MD;  Location: [facility];  Service: Urology;  Laterality: Left;   MULTIPLE TOOTH EXTRACTIONS     ROBOTIC ASSITED PARTIAL NEPHRECTOMY Right 12/05/2017   Procedure: XI ROBOTIC ASSITED LAPAROSCOPIC RIGHT PARTIAL NEPHRECTOMY;  Surgeon: Ardis Hughs, MD;  Location: [facility];  Service: Urology;  Laterality: Right;   VARICOSE VEIN SURGERY Bilateral    legs   Family History  Problem Relation Age of Onset   Renal Cyst Other    Social History:  reports that he has been smoking cigarettes. He has never  used smokeless tobacco. He reports that he does not drink alcohol and does not use drugs.  ROS: As per HPI otherwise negative.  Physical Exam: Vitals:   08/16/21 1532 08/16/21 2322 08/17/21 0352 08/17/21 0700  BP: 119/72 108/65 110/62 98/62  Pulse: 65 68 62 62  Resp: 20   18  Temp: 99.3 F (37.4 C) 98.5 F (36.9 C) 97.6 F (36.4 C) 98.3 F (36.8 C)  TempSrc: Oral Oral Axillary Oral  SpO2: 95% 95% 90% (!) 89%  Weight:  93.5 kg     Height: 5\' 6"  (1.676 m)        General: Well developed, well nourished, in no acute distress. Head: Normocephalic, atraumatic, sclera non-icteric, mucus membranes are moist. Neck: JVD not elevated. Lungs: Clear bilaterally to auscultation without wheezes, rales, or rhonchi. Breathing is unlabored on RA Heart: RRR with normal S1, S2. No murmurs, rubs, or gallops appreciated. Abdomen: Soft, non-tender, non-distended with normoactive bowel sounds. No rebound/guarding. No obvious abdominal masses. Musculoskeletal:  RUE in sling Lower extremities: No edema b/l lower extremities Neuro: Alert and oriented X 3. Moves all extremities spontaneously. Psych:  Responds to questions appropriately with a normal affect. Dialysis Access: RUE AVF +bruit  Allergies  Allergen Reactions   Ace Inhibitors Swelling    *Lotension    Zonisamide Rash   Prior to Admission medications   Medication Sig Start Date End Date Taking? Authorizing Provider  allopurinol (ZYLOPRIM) 100 MG tablet Take 100 mg by mouth 2 (two) times daily. 11/27/18  Yes [provider]  amLODipine (NORVASC) 10 MG tablet Take 10 mg by mouth daily.   Yes [provider]  atenolol (TENORMIN) 100 MG tablet Take 100 mg by mouth daily.   Yes [provider]  atorvastatin (LIPITOR) 40 MG tablet Take 40 mg by mouth daily.    Yes [provider]  Cholecalciferol (VITAMIN D-3) 125 MCG (5000 UT) TABS Take 5,000 mg by mouth daily.   Yes [provider]  ferrous sulfate 325 (65 FE) MG tablet Take 325 mg by mouth See admin instructions. Take one tablet by mouth on Mon, wed and Fridays per patient   Yes [provider]  furosemide (LASIX) 40 MG tablet Take 40 mg by mouth 2 (two) times daily.  11/29/19  Yes [provider]  HYDROcodone-acetaminophen (NORCO/VICODIN) 5-325 MG tablet Take 1 tablet by mouth every 6 (six) hours as needed for severe pain. 08/15/21  Yes Truddie Hidden, MD   levETIRAcetam (KEPPRA) 500 MG tablet Take 500 mg by mouth 2 (two) times daily. 12/08/18  Yes [provider]  lidocaine (LIDODERM) 5 % Place 1 patch onto the skin daily. 08/15/21  Yes [provider]  omega-3 acid ethyl esters (LOVAZA) 1 g capsule Take 1 g by mouth daily.   Yes [provider]   Current Facility-Administered Medications  Medication Dose Route Frequency Provider Last Rate Last Admin   0.9 %  sodium chloride infusion  250 mL Intravenous PRN Doutova, Anastassia, MD       0.9 %  sodium chloride infusion  100 mL Intravenous PRN Gean Quint, MD       0.9 %  sodium chloride infusion  100 mL Intravenous PRN Gean Quint, MD       acetaminophen (TYLENOL) tablet 650 mg  650 mg Oral Q6H PRN Doutova, Anastassia, MD       Or   acetaminophen (TYLENOL) suppository 650 mg  650 mg Rectal Q6H PRN Toy Baker, MD  allopurinol (ZYLOPRIM) tablet 100 mg  100 mg Oral BID Toy Baker, MD   100 mg at 08/16/21 2252   alteplase (CATHFLO ACTIVASE) injection 2 mg  2 mg Intracatheter Once PRN Gean Quint, MD       atorvastatin (LIPITOR) tablet 40 mg  40 mg Oral Daily Doutova, Anastassia, MD   40 mg at 08/16/21 1001   Chlorhexidine Gluconate Cloth 2 % PADS 6 each  6 each Topical Q0600 Gean Quint, MD       heparin injection 1,000 Units  1,000 Units Dialysis PRN Gean Quint, MD       HYDROcodone-acetaminophen (NORCO/VICODIN) 5-325 MG per tablet 1-2 tablet  1-2 tablet Oral Q4H PRN Toy Baker, MD   2 tablet at 08/17/21 0505   levETIRAcetam (KEPPRA) tablet 500 mg  500 mg Oral BID Toy Baker, MD   500 mg at 08/16/21 2252   lidocaine (PF) (XYLOCAINE) 1 % injection 5 mL  5 mL Intradermal PRN Gean Quint, MD       lidocaine-prilocaine (EMLA) cream 1 application  1 application Topical PRN Gean Quint, MD       midodrine (PROAMATINE) tablet 5 mg  5 mg Oral TID WC Oswald Hillock, MD   5 mg at 08/16/21 1747   pentafluoroprop-tetrafluoroeth  (GEBAUERS) aerosol 1 application  1 application Topical PRN Gean Quint, MD       sodium chloride flush (NS) 0.9 % injection 3 mL  3 mL Intravenous Q12H Doutova, Anastassia, MD   3 mL at 08/16/21 2256   sodium chloride flush (NS) 0.9 % injection 3 mL  3 mL Intravenous PRN Toy Baker, MD       traMADol (ULTRAM) tablet 50-100 mg  50-100 mg Oral Q6H PRN Toy Baker, MD       Labs: Basic Metabolic Panel: Recent Labs  Lab 08/15/21 1522 08/16/21 0520 08/17/21 0231 08/17/21 0510  NA 140 135 135  --   K 3.9 3.8 6.4* 4.1  CL 92* 95* 94*  --   CO2 35* 31 28  --   GLUCOSE 87 123* 92  --   BUN 22* 31* 53*  --   CREATININE 6.85* 8.35* 11.48*  --   CALCIUM 9.6 8.9 9.3  --   PHOS  --  3.8  --   --    Liver Function Tests: Recent Labs  Lab 08/16/21 0520  AST 28  ALT 34  ALKPHOS 118  BILITOT 0.9  PROT 6.1*  ALBUMIN 3.4*   No results for input(s): LIPASE, AMYLASE in the last 168 hours. No results for input(s): AMMONIA in the last 168 hours. CBC: Recent Labs  Lab 08/15/21 1522 08/16/21 0520 08/17/21 0231  WBC 11.3* 6.6 8.5  NEUTROABS 9.6* 5.0  --   HGB 11.4* 10.1* 10.8*  HCT 35.0* 30.0* 31.5*  MCV 97.8 97.4 93.5  PLT 188 140* 162   Cardiac Enzymes: No results for input(s): CKTOTAL, CKMB, CKMBINDEX, TROPONINI in the last 168 hours. CBG: Recent Labs  Lab 08/15/21 1806 08/16/21 1637  GLUCAP 114* 120*   Iron Studies: No results for input(s): IRON, TIBC, TRANSFERRIN, FERRITIN in the last 72 hours. Studies/Results: DG Shoulder Right  Result Date: 08/15/2021 CLINICAL DATA:  fall, R shoulder pain EXAM: RIGHT SHOULDER - 2+ VIEW COMPARISON:  None. FINDINGS: There is cortical regularity surrounding the right humeral head with a suspected fracture line visible only on the axillary Y-view. Findings suggest a minimally displaced fracture of the proximal humerus. However definite characterization is suboptimal on  provided radiographs. IMPRESSION: High suspicion for  minimally displaced fracture of the proximal humerus. A CT of the right shoulder is recommended for more definite characterization. Electronically Signed   By: Albin Felling M.D.   On: 08/15/2021 16:09   CT Shoulder Right Wo Contrast  Result Date: 08/15/2021 CLINICAL DATA:  Concern for humeral fracture. EXAM: CT OF THE UPPER RIGHT EXTREMITY WITHOUT CONTRAST TECHNIQUE: Multidetector CT imaging of the upper right extremity was performed according to the standard protocol. COMPARISON:  Radiograph dated 08/15/2021. FINDINGS: Bones/Joint/Cartilage There is a nondisplaced fracture of the humeral neck. No dislocation. There is mild joint effusion and edema. No fluid collection. Ligaments Suboptimally assessed by CT. Muscles and Tendons No intramuscular hematoma or fluid collection. Soft tissues No fluid collection. IMPRESSION: Nondisplaced fracture of the humeral neck. No dislocation. Electronically Signed   By: Anner Crete M.D.   On: 08/15/2021 20:10   ECHOCARDIOGRAM COMPLETE  Result Date: 08/16/2021    ECHOCARDIOGRAM REPORT   Patient Name:   YUVAAN OLANDER Date of Exam: 08/16/2021 Medical Rec #:  [id number]  Height:       65.0 in Accession #:    [id number] Weight:       201.0 lb Date of Birth:  September 10, 1961 BSA:          1.982 m Patient Age:    42 years   BP:           97/65 mmHg Patient Gender: M          HR:           61 bpm. Exam Location:  Inpatient Procedure: 2D Echo, 3D Echo, Cardiac Doppler, Color Doppler and Strain Analysis Indications:    Syncope R55  History:        Patient has no prior history of Echocardiogram examinations.                 Risk Factors:Hypertension and Dyslipidemia. Chronic kidney                 disease, anemia. Gout.  Sonographer:    Darlina Sicilian RDCS Referring Phys: [place]  1. Left ventricular ejection fraction, by estimation, is 60 to 65%. Left ventricular ejection fraction by 3D volume is 61 %. The left ventricle has normal function. The left  ventricle has no regional wall motion abnormalities. There is moderate left ventricular hypertrophy of the basal-septal segment. Left ventricular diastolic parameters were normal. The average left ventricular global longitudinal strain is -21.7 %. The global longitudinal strain is normal.  2. Right ventricular systolic function is normal. The right ventricular size is normal. Tricuspid regurgitation signal is inadequate for assessing PA pressure.  3. The mitral valve is normal in structure. Trivial mitral valve regurgitation. No evidence of mitral stenosis.  4. The aortic valve is normal in structure. Aortic valve regurgitation is not visualized. No aortic stenosis is present.  5. Aortic dilatation noted. There is borderline dilatation of the ascending aorta, measuring 37 mm.  6. The inferior vena cava is normal in size with greater than 50% respiratory variability, suggesting right atrial pressure of 3 mmHg. FINDINGS  Left Ventricle: Left ventricular ejection fraction, by estimation, is 60 to 65%. Left ventricular ejection fraction by 3D volume is 61 %. The left ventricle has normal function. The left ventricle has no regional wall motion abnormalities. The average left ventricular global longitudinal strain is -21.7 %. The global longitudinal strain is normal. The left ventricular internal cavity size was normal in  size. There is moderate left ventricular hypertrophy of the basal-septal segment. Left ventricular diastolic parameters were normal. Normal left ventricular filling pressure. Right Ventricle: The right ventricular size is normal. No increase in right ventricular wall thickness. Right ventricular systolic function is normal. Tricuspid regurgitation signal is inadequate for assessing PA pressure. Left Atrium: Left atrial size was normal in size. Right Atrium: Right atrial size was normal in size. Pericardium: There is no evidence of pericardial effusion. Mitral Valve: The mitral valve is normal in  structure. Trivial mitral valve regurgitation. No evidence of mitral valve stenosis. Tricuspid Valve: The tricuspid valve is normal in structure. Tricuspid valve regurgitation is not demonstrated. No evidence of tricuspid stenosis. Aortic Valve: The aortic valve is normal in structure. Aortic valve regurgitation is not visualized. No aortic stenosis is present. Pulmonic Valve: The pulmonic valve was normal in structure. Pulmonic valve regurgitation is mild. No evidence of pulmonic stenosis. Aorta: Aortic dilatation noted. There is borderline dilatation of the ascending aorta, measuring 37 mm. Venous: The inferior vena cava is normal in size with greater than 50% respiratory variability, suggesting right atrial pressure of 3 mmHg. IAS/Shunts: No atrial level shunt detected by color flow Doppler.  LEFT VENTRICLE PLAX 2D LVIDd:         5.50 cm         Diastology LVIDs:         3.50 cm         LV e' medial:    8.10 cm/s LV PW:         1.10 cm         LV E/e' medial:  9.2 LV IVS:        1.20 cm         LV e' lateral:   10.10 cm/s LVOT diam:     2.20 cm         LV E/e' lateral: 7.4 LV SV:         82 LV SV Index:   41              2D LVOT Area:     3.80 cm        Longitudinal                                Strain                                2D Strain GLS  -21.4 %                                (A2C):                                2D Strain GLS  -23.5 %                                (A3C):                                2D Strain GLS  -20.2 %                                (  A4C):                                2D Strain GLS  -21.7 %                                Avg:                                 3D Volume EF                                LV 3D EF:    Left                                             ventricul                                             ar                                             ejection                                             fraction                                             by 3D                                              volume is                                             61 %.                                 3D Volume EF:                                3D EF:        61 %                                LV EDV:       168 ml  LV ESV:       66 ml                                LV SV:        101 ml RIGHT VENTRICLE RV S prime:     19.00 cm/s TAPSE (M-mode): 2.6 cm LEFT ATRIUM             Index        RIGHT ATRIUM           Index LA diam:        4.90 cm 2.47 cm/m   RA Area:     14.60 cm LA Vol (A2C):   33.7 ml 17.00 ml/m  RA Volume:   30.70 ml  15.49 ml/m LA Vol (A4C):   58.5 ml 29.51 ml/m LA Biplane Vol: 45.1 ml 22.75 ml/m  AORTIC VALVE LVOT Vmax:   106.00 cm/s LVOT Vmean:  70.500 cm/s LVOT VTI:    0.215 m  AORTA Ao Root diam: 3.50 cm Ao Asc diam:  3.70 cm MITRAL VALVE MV Area (PHT): 3.50 cm    SHUNTS MV Decel Time: 217 msec    Systemic VTI:  0.22 m MV E velocity: 74.30 cm/s  Systemic Diam: 2.20 cm MV A velocity: 79.00 cm/s MV E/A ratio:  0.94 Fransico Him MD Electronically signed by Fransico Him MD Signature Date/Time: 08/16/2021/9:11:55 AM    Final    VAS US CAROTID  Result Date: 08/16/2021 Carotid Arterial Duplex Study Patient Name:  LOGEN HEINTZELMAN  Date of Exam:   08/16/2021 Medical Rec #: [id number]   Accession #:    [id number] Date of Birth: [birth year]  Patient Gender: M Patient Age:   70 years Exam Location:  [facility] Procedure:      VAS US CAROTID Referring Phys: Nyoka Lint DOUTOVA --------------------------------------------------------------------------------  Indications:       Syncope and Hypotension after dialysis. Risk Factors:      Hypertension, hyperlipidemia. Other Factors:     ESRD. Limitations        Today's exam was limited due to Broken shoulder/sling. Comparison Study:  No prior study Performing Technologist: Sharion Dove RVS  Examination Guidelines: A complete evaluation includes B-mode imaging, spectral Doppler, color  Doppler, and power Doppler as needed of all accessible portions of each vessel. Bilateral testing is considered an integral part of a complete examination. Limited examinations for reoccurring indications may be performed as noted.  Right Carotid Findings: +----------+--------+--------+--------+------------------+------------------+           PSV cm/sEDV cm/sStenosisPlaque DescriptionComments           +----------+--------+--------+--------+------------------+------------------+ CCA Prox  104     14                                intimal thickening +----------+--------+--------+--------+------------------+------------------+ CCA Distal100     20                                intimal thickening +----------+--------+--------+--------+------------------+------------------+ ICA Prox  65      14                                                   +----------+--------+--------+--------+------------------+------------------+  ICA Distal55      13                                                   +----------+--------+--------+--------+------------------+------------------+ ECA       109     6                                                    +----------+--------+--------+--------+------------------+------------------+ +----------+--------+-------+------------+-------------------+           PSV cm/sEDV cmsDescribe    Arm Pressure (mmHG) +----------+--------+-------+------------+-------------------+ Subclavian               Not assessed                    +----------+--------+-------+------------+-------------------+ +---------+--------+--+--------+-+ VertebralPSV cm/s30EDV cm/s6 +---------+--------+--+--------+-+  Left Carotid Findings: +----------+--------+--------+--------+------------------+------------------+           PSV cm/sEDV cm/sStenosisPlaque DescriptionComments           +----------+--------+--------+--------+------------------+------------------+  CCA Prox  95      23                                intimal thickening +----------+--------+--------+--------+------------------+------------------+ CCA Distal105     13                                intimal thickening +----------+--------+--------+--------+------------------+------------------+ ICA Prox  65      23                                                   +----------+--------+--------+--------+------------------+------------------+ ICA Distal72      24                                                   +----------+--------+--------+--------+------------------+------------------+ ECA       114     16                                                   +----------+--------+--------+--------+------------------+------------------+ +----------+--------+--------+------------+-------------------+           PSV cm/sEDV cm/sDescribe    Arm Pressure (mmHG) +----------+--------+--------+------------+-------------------+ Subclavian                Not assessed                    +----------+--------+--------+------------+-------------------+ +---------+--------+--+--------+--+ VertebralPSV cm/s44EDV cm/s16 +---------+--------+--+--------+--+   Summary: Right Carotid: The extracranial vessels were near-normal with only minimal wall                thickening or plaque. Left Carotid: The extracranial vessels were near-normal with only minimal wall  thickening or plaque. Vertebrals:  Bilateral vertebral arteries demonstrate antegrade flow. Subclavians: Not assessed secondary to sling. *See table(s) above for measurements and observations.  Electronically signed by Orlie Pollen on 08/16/2021 at 6:45:25 PM.    Final     Dialysis Orders: Center: [facility]  on MWF. 4 hours, 180NRe, BFR 400, DFR 800, EDW 91.2Kg, 2K, 2Ca, AVF 15g, no heparin Venofer 100mg  IV q HD- 08/13/21-08/22/21 Hectorol 3 mcg IV q HD  Assessment/Plan:  R proximal humerus fracture: Planned for  non-operative management with 4 weeks in a sling. His AVF is in his RUE. I discussed with ortho PA who reports gentle movement of the arm to access his fistula is ok but elbow should remain by his side   ESRD:  MWF, planned for HD tomorrow once hep B labs return  Orthostatic hypotension: Pt reports dizziness with standing, worse after dialysis. Had a syncopal episode resulting in fall. For now, will hold all antihypertensive meds and add back very cautiously if BP increases.  Pt reports that he stood up too quickly when someone came to the door.  Low BP's in hospital and agree with holding bp meds and slowly resume amlodipine at lower dose of 5 mg if BP starts to rise.   Volume: does not appear significantly volume overloaded but feels he does have some extra fluid. UF to EDW as tolerated today, will monitor BP post HD  Anemia: Hgb 10.8. Will plan to resume venofer at discharge.  Metabolic bone disease: Calcium 9.3, no phos reported yet. Resume binders and hectorol.   Nutrition:  Renal diet/ fluid restrictions. No albumin reported yet.   Anice Paganini, PA-C 08/17/2021, 8:36 AM  [facility] Pager: [phone]  I have seen and examined this patient and agree with plan and assessment in the above note with renal recommendations/intervention highlighted. Hold all bp meds for now and follow BP with HD.  Ok to use RUE AVF for now with gentle movement.  Elbow to remain at his side which should not interfere with cannulation.  Broadus Zakariye A Shelanda Duvall,MD 08/17/2021 9:10 AM

## 2021-08-17 NOTE — Progress Notes (Signed)
PROGRESS NOTE    Logan Decker  NGE:[id number] DOB: 1961-08-07 DOA: 08/15/2021 PCP: Robert Bellow, PA-C   Chief Complaint  Patient presents with   Loss of Consciousness   Shoulder Injury   Brief Narrative/Hospital Course:  Logan Decker, 60 y.o. male with PMH of ESRD on HD MWF, HLD, history of seizure disorder, history of brain aneurysm presents with fall due to syncope.  He went to hemodialysis and was sitting talking to some friends, he felt lightheaded and sat back down, tried to walk to his room but fainted and passed out hitting the right shoulder would not get up due to pain, he [place] and defecated on himself, felt diaphoretic.[name]  He reported compliance with seizure medication.  He has history of hypotension after dialysis and has been trying to get off of the blood pressure medication. He was seen in the ED and was admitted for further management   Subjective: Seen and examined dialysis.  He reports no new complaint. Assessment & Plan:  Syncope likely from orthostatic hypotension: So far rest of the work-up unremarkable echocardiogram with normal EF moderate LVH, troponin was 9 and had no chest pain.  Monitor on telemetry.  Underwent EEG that shows cortical dysfunction due to underlying encephalomalacia, no seizure-like activity.  Carotid duplex unremarkable.  We will do PT OT evaluation check orthostatic vitals today  Orthostatic hypotension likely cause of syncope, blood pressure has been soft home amlodipine atenolol Lasix remains on hold, patient is started on midodrine 5 mg 3 times daily.  Check orthostatic vitals with PT OT today  Seizure disorder reports being compliant, EEG no seizure-like activity.  Continues Keppra.  Monitor.  On ESRD and HD MWF-getting dialysis today appreciate nephrology input Anemia of chronic kidney disease hemoglobin stable Volume/hypertension: BP soft, ON MIDODRINE Metabolic bone disease calcium 9.3 continue phosphate binder and  Hectorol Hyperkalemia resolved with treatment, continue HD  Nondisplaced right proximal humerus fracture seen by orthopedic, nonoperative management continue right shoulder in sling nonweightbearing on right arm follow-up with orthopedic as outpatient  Class I Obesity:Patient's Body mass index is 33.66 kg/m. : Will benefit with PCP follow-up, weight loss  healthy lifestyle and outpatient sleep evaluation.  DVT prophylaxis: SCDs Start: 08/16/21 0520 Code Status:   Code Status: Full Code Family Communication: plan of care discussed with patient at bedside. Status is: Inpatient  Remains inpatient appropriate because: For ongoing management of syncope, cardiology for PT OT evaluation as well as orthostatic vitals monitoring, he is in dialysis today  Objective: Vitals last 24 hrs: Vitals:   08/17/21 1130 08/17/21 1200 08/17/21 1230 08/17/21 1300  BP: 105/64 110/65 107/68 (!) 163/49  Pulse: 63 (!) 58 (!) 58   Resp: 20 (!) 21 18   Temp:      TempSrc:      SpO2: 96% 98% 100%   Weight:      Height:       Weight change: 2.327 kg  Intake/Output Summary (Last 24 hours) at 08/17/2021 1337 Last data filed at 08/17/2021 0940 Gross per 24 hour  Intake 480 ml  Output --  Net 480 ml   Net IO Since Admission: 980 mL [08/17/21 1337]   Physical Examination: General exam: AA0X3, weak,older than stated age. HEENT:Oral mucosa moist, Ear/Nose WNL grossly,dentition normal. Respiratory system: B/l diminished BS, no use of accessory muscle, non tender. Cardiovascular system: S1 & S2 +,No JVD. Gastrointestinal system: Abdomen soft, NT,ND, BS+. Nervous System:Alert, awake, moving extremities. Extremities: edema none, distal peripheral pulses palpable.  Skin:  No rashes, no icterus. MSK: Normal muscle bulk, tone, power.  Medications reviewed:  Scheduled Meds:  allopurinol  100 mg Oral BID   atorvastatin  40 mg Oral Daily   Chlorhexidine Gluconate Cloth  6 each Topical Q0600   doxercalciferol   3 mcg Intravenous Q M,W,F-HD   levETIRAcetam  500 mg Oral BID   midodrine  5 mg Oral TID WC   sodium chloride flush  3 mL Intravenous Q12H   sucroferric oxyhydroxide  1,000 mg Oral TID WC   Continuous Infusions:  sodium chloride     sodium chloride     sodium chloride      Diet Order             Diet renal with fluid restriction Fluid restriction: 1200 mL Fluid; Room service appropriate? No; Fluid consistency: Thin  Diet effective now                          Weight change: 2.327 kg  Wt Readings from Last 3 Encounters:  08/17/21 94.6 kg  11/10/20 100.2 kg  11/09/20 100.2 kg     Consultants:see note  Procedures:see note Antimicrobials: Anti-infectives (From admission, onward)    None      Culture/Microbiology    Component Value Date/Time   SDES  11/09/2020 1316    URINE, CLEAN CATCH Performed at [facility], [place] [address], [place], [place] [address]    SPECREQUEST  11/09/2020 1316    NONE Performed at [facility], [place] [address] [place], [place] [address]    CULT (A) 11/09/2020 1316    <10,000 COLONIES/mL INSIGNIFICANT GROWTH Performed at [facility] [address] [place],  [address]    REPTSTATUS 11/11/2020 FINAL 11/09/2020 1316    Other culture-see note  Unresulted Labs (From admission, onward)     Start     Ordered   08/17/21 1058  MRSA Next Gen by PCR, Nasal  Once,   R        08/17/21 1057   08/17/21 0631  Hepatitis B surface antibody,quantitative  (New Admission Hemo Labs (Hepatitis B))  Once,   R        08/17/21 0631   08/17/21 0354  Potassium  Now then every 4 hours,   STAT (with TIMED occurrences)     Comments: Until normal twice.    08/17/21 0355          Data Reviewed: I have personally reviewed following labs and imaging studies CBC: Recent Labs  Lab 08/15/21 1522 08/16/21 0520 08/17/21 0231 08/17/21 0933  WBC 11.3* 6.6 8.5 7.9  NEUTROABS 9.6* 5.0  --   --   HGB  11.4* 10.1* 10.8* 10.2*  HCT 35.0* 30.0* 31.5* 29.7*  MCV 97.8 97.4 93.5 94.0  PLT 188 140* 162 481   Basic Metabolic Panel: Recent Labs  Lab 08/15/21 1522 08/16/21 0520 08/17/21 0231 08/17/21 0510 08/17/21 0933  NA 140 135 135  --  132*  K 3.9 3.8 6.4* 4.1 4.2  4.2  CL 92* 95* 94*  --  89*  CO2 35* 31 28  --  31  GLUCOSE 87 123* 92  --  90  BUN 22* 31* 53*  --  56*  CREATININE 6.85* 8.35* 11.48*  --  12.00*  CALCIUM 9.6 8.9 9.3  --  8.9  MG  --  2.1  --   --   --   PHOS  --  3.8  --   --  5.5*   GFR: Estimated Creatinine Clearance: 7.3 mL/min (A) (by C-G formula based on SCr of 12 mg/dL (H)). Liver Function Tests: Recent Labs  Lab 08/16/21 0520 08/17/21 0933  AST 28  --   ALT 34  --   ALKPHOS 118  --   BILITOT 0.9  --   PROT 6.1*  --   ALBUMIN 3.4* 3.3*   No results for input(s): LIPASE, AMYLASE in the last 168 hours. No results for input(s): AMMONIA in the last 168 hours. Coagulation Profile: No results for input(s): INR, PROTIME in the last 168 hours. Cardiac Enzymes: No results for input(s): CKTOTAL, CKMB, CKMBINDEX, TROPONINI in the last 168 hours. BNP (last 3 results) No results for input(s): PROBNP in the last 8760 hours. HbA1C: No results for input(s): HGBA1C in the last 72 hours. CBG: Recent Labs  Lab 08/15/21 1806 08/16/21 1637  GLUCAP 114* 120*   Lipid Profile: No results for input(s): CHOL, HDL, LDLCALC, TRIG, CHOLHDL, LDLDIRECT in the last 72 hours. Thyroid Function Tests: Recent Labs    08/16/21 0520  TSH 0.634   Anemia Panel: No results for input(s): VITAMINB12, FOLATE, FERRITIN, TIBC, IRON, RETICCTPCT in the last 72 hours. Sepsis Labs: No results for input(s): PROCALCITON, LATICACIDVEN in the last 168 hours.  Recent Results (from the past 240 hour(s))  Resp Panel by RT-PCR (Flu A&B, Covid) Nasopharyngeal Swab     Status: None   Collection Time: 08/15/21 10:30 PM   Specimen: Nasopharyngeal Swab; Nasopharyngeal(NP) swabs in vial  transport medium  Result Value Ref Range Status   SARS Coronavirus 2 by RT PCR NEGATIVE NEGATIVE Final    Comment: (NOTE) SARS-CoV-2 target nucleic acids are NOT DETECTED.  The SARS-CoV-2 RNA is generally detectable in upper respiratory specimens during the acute phase of infection. The lowest concentration of SARS-CoV-2 viral copies this assay can detect is 138 copies/mL. A negative result does not preclude SARS-Cov-2 infection and should not be used as the sole basis for treatment or other patient management decisions. A negative result may occur with  improper specimen collection/handling, submission of specimen other than nasopharyngeal swab, presence of viral mutation(s) within the areas targeted by this assay, and inadequate number of viral copies(<138 copies/mL). A negative result must be combined with clinical observations, patient history, and epidemiological information. The expected result is Negative.  Fact Sheet for Patients:  EntrepreneurPulse.com.au  Fact Sheet for Healthcare Providers:  IncredibleEmployment.be  This test is no t yet approved or cleared by the [place] FDA and  has been authorized for detection and/or diagnosis of SARS-CoV-2 by FDA under an Emergency Use Authorization (EUA). This EUA will remain  in effect (meaning this test can be used) for the duration of the COVID-19 declaration under Section 564(b)(1) of the Act, 21 U.S.C.section 360bbb-3(b)(1), unless the authorization is terminated  or revoked sooner.       Influenza A by PCR NEGATIVE NEGATIVE Final   Influenza B by PCR NEGATIVE NEGATIVE Final    Comment: (NOTE) The Xpert Xpress SARS-CoV-2/FLU/RSV plus assay is intended as an aid in the diagnosis of influenza from Nasopharyngeal swab specimens and should not be used as a sole basis for treatment. Nasal washings and aspirates are unacceptable for Xpert Xpress SARS-CoV-2/FLU/RSV testing.  Fact  Sheet for Patients: EntrepreneurPulse.com.au  Fact Sheet for Healthcare Providers: IncredibleEmployment.be  This test is not yet approved or cleared by the [place] FDA and has been authorized for detection and/or diagnosis of SARS-CoV-2  by FDA under an Emergency Use Authorization (EUA). This EUA will remain in effect (meaning this test can be used) for the duration of the COVID-19 declaration under Section 564(b)(1) of the Act, 21 U.S.C. section 360bbb-3(b)(1), unless the authorization is terminated or revoked.  Performed at [facility], [place] [address] [place], [place] [address]      Radiology Studies: DG Shoulder Right  Result Date: 08/15/2021 CLINICAL DATA:  fall, R shoulder pain EXAM: RIGHT SHOULDER - 2+ VIEW COMPARISON:  None. FINDINGS: There is cortical regularity surrounding the right humeral head with a suspected fracture line visible only on the axillary Y-view. Findings suggest a minimally displaced fracture of the proximal humerus. However definite characterization is suboptimal on provided radiographs. IMPRESSION: High suspicion for minimally displaced fracture of the proximal humerus. A CT of the right shoulder is recommended for more definite characterization. Electronically Signed   By: Albin Felling M.D.   On: 08/15/2021 16:09   CT Shoulder Right Wo Contrast  Result Date: 08/15/2021 CLINICAL DATA:  Concern for humeral fracture. EXAM: CT OF THE UPPER RIGHT EXTREMITY WITHOUT CONTRAST TECHNIQUE: Multidetector CT imaging of the upper right extremity was performed according to the standard protocol. COMPARISON:  Radiograph dated 08/15/2021. FINDINGS: Bones/Joint/Cartilage There is a nondisplaced fracture of the humeral neck. No dislocation. There is mild joint effusion and edema. No fluid collection. Ligaments Suboptimally assessed by CT. Muscles and Tendons No intramuscular hematoma or fluid collection. Soft tissues No fluid  collection. IMPRESSION: Nondisplaced fracture of the humeral neck. No dislocation. Electronically Signed   By: Anner Crete M.D.   On: 08/15/2021 20:10   EEG adult  Result Date: 08/17/2021 Lora Havens, MD     08/17/2021  8:42 AM Patient Name: Logan Decker MRN: [id number] Epilepsy Attending: Lora Havens Referring Physician/Provider: Dr Eleonore Chiquito Date: 08/16/2021 Duration: 24.30 mins Patient history: 60 year old male with history of epilepsy on Keppra who presented after an episode of syncope which was associated with bowel and bladder incontinence.  EEG to evaluate for seizure. Level of alertness: Awake AEDs during EEG study: Technical aspects: This EEG study was done with scalp electrodes positioned according to the 10-20 International system of electrode placement. Electrical activity was acquired at a sampling rate of 500Hz  and reviewed with a high frequency filter of 70Hz  and a low frequency filter of 1Hz . EEG data were recorded continuously and digitally stored. Description: The posterior dominant rhythm consists of 8 Hz activity of moderate voltage (25-35 uV) seen predominantly in posterior head regions, symmetric and reactive to eye opening and eye closing.  EEG showed continuous 3 to 6 Hz theta-delta slowing in right hemisphere, maximal right fronto-temporal region.  Hyperventilation and photic stimulation were not performed.   ABNORMALITY -Continuous slow, right hemisphere, maximal right fronto-temporal region IMPRESSION: This study is suggestive of cortical dysfunction in right hemisphere, maximal right frontotemporal region likely secondary to underlying structural abnormality/ encephalomalacia. No seizures or definite epileptiform discharges were seen throughout the recording. Lora Havens   ECHOCARDIOGRAM COMPLETE  Result Date: 08/16/2021    ECHOCARDIOGRAM REPORT   Patient Name:   Logan Decker Date of Exam: 08/16/2021 Medical Rec #:  [id number]  Height:       65.0 in Accession #:     [id number] Weight:       201.0 lb Date of Birth:  06-07-1961 BSA:          1.982 m Patient Age:    54 years   BP:  97/65 mmHg Patient Gender: M          HR:           61 bpm. Exam Location:  Inpatient Procedure: 2D Echo, 3D Echo, Cardiac Doppler, Color Doppler and Strain Analysis Indications:    Syncope R55  History:        Patient has no prior history of Echocardiogram examinations.                 Risk Factors:Hypertension and Dyslipidemia. Chronic kidney                 disease, anemia. Gout.  Sonographer:    Darlina Sicilian RDCS Referring Phys: Tse Bonito  1. Left ventricular ejection fraction, by estimation, is 60 to 65%. Left ventricular ejection fraction by 3D volume is 61 %. The left ventricle has normal function. The left ventricle has no regional wall motion abnormalities. There is moderate left ventricular hypertrophy of the basal-septal segment. Left ventricular diastolic parameters were normal. The average left ventricular global longitudinal strain is -21.7 %. The global longitudinal strain is normal.  2. Right ventricular systolic function is normal. The right ventricular size is normal. Tricuspid regurgitation signal is inadequate for assessing PA pressure.  3. The mitral valve is normal in structure. Trivial mitral valve regurgitation. No evidence of mitral stenosis.  4. The aortic valve is normal in structure. Aortic valve regurgitation is not visualized. No aortic stenosis is present.  5. Aortic dilatation noted. There is borderline dilatation of the ascending aorta, measuring 37 mm.  6. The inferior vena cava is normal in size with greater than 50% respiratory variability, suggesting right atrial pressure of 3 mmHg. FINDINGS  Left Ventricle: Left ventricular ejection fraction, by estimation, is 60 to 65%. Left ventricular ejection fraction by 3D volume is 61 %. The left ventricle has normal function. The left ventricle has no regional wall motion abnormalities.  The average left ventricular global longitudinal strain is -21.7 %. The global longitudinal strain is normal. The left ventricular internal cavity size was normal in size. There is moderate left ventricular hypertrophy of the basal-septal segment. Left ventricular diastolic parameters were normal. Normal left ventricular filling pressure. Right Ventricle: The right ventricular size is normal. No increase in right ventricular wall thickness. Right ventricular systolic function is normal. Tricuspid regurgitation signal is inadequate for assessing PA pressure. Left Atrium: Left atrial size was normal in size. Right Atrium: Right atrial size was normal in size. Pericardium: There is no evidence of pericardial effusion. Mitral Valve: The mitral valve is normal in structure. Trivial mitral valve regurgitation. No evidence of mitral valve stenosis. Tricuspid Valve: The tricuspid valve is normal in structure. Tricuspid valve regurgitation is not demonstrated. No evidence of tricuspid stenosis. Aortic Valve: The aortic valve is normal in structure. Aortic valve regurgitation is not visualized. No aortic stenosis is present. Pulmonic Valve: The pulmonic valve was normal in structure. Pulmonic valve regurgitation is mild. No evidence of pulmonic stenosis. Aorta: Aortic dilatation noted. There is borderline dilatation of the ascending aorta, measuring 37 mm. Venous: The inferior vena cava is normal in size with greater than 50% respiratory variability, suggesting right atrial pressure of 3 mmHg. IAS/Shunts: No atrial level shunt detected by color flow Doppler.  LEFT VENTRICLE PLAX 2D LVIDd:         5.50 cm         Diastology LVIDs:         3.50 cm  LV e' medial:    8.10 cm/s LV PW:         1.10 cm         LV E/e' medial:  9.2 LV IVS:        1.20 cm         LV e' lateral:   10.10 cm/s LVOT diam:     2.20 cm         LV E/e' lateral: 7.4 LV SV:         82 LV SV Index:   41              2D LVOT Area:     3.80 cm         Longitudinal                                Strain                                2D Strain GLS  -21.4 %                                (A2C):                                2D Strain GLS  -23.5 %                                (A3C):                                2D Strain GLS  -20.2 %                                (A4C):                                2D Strain GLS  -21.7 %                                Avg:                                 3D Volume EF                                LV 3D EF:    Left                                             ventricul                                             ar  ejection                                             fraction                                             by 3D                                             volume is                                             61 %.                                 3D Volume EF:                                3D EF:        61 %                                LV EDV:       168 ml                                LV ESV:       66 ml                                LV SV:        101 ml RIGHT VENTRICLE RV S prime:     19.00 cm/s TAPSE (M-mode): 2.6 cm LEFT ATRIUM             Index        RIGHT ATRIUM           Index LA diam:        4.90 cm 2.47 cm/m   RA Area:     14.60 cm LA Vol (A2C):   33.7 ml 17.00 ml/m  RA Volume:   30.70 ml  15.49 ml/m LA Vol (A4C):   58.5 ml 29.51 ml/m LA Biplane Vol: 45.1 ml 22.75 ml/m  AORTIC VALVE LVOT Vmax:   106.00 cm/s LVOT Vmean:  70.500 cm/s LVOT VTI:    0.215 m  AORTA Ao Root diam: 3.50 cm Ao Asc diam:  3.70 cm MITRAL VALVE MV Area (PHT): 3.50 cm    SHUNTS MV Decel Time: 217 msec    Systemic VTI:  0.22 m MV E velocity: 74.30 cm/s  Systemic Diam: 2.20 cm MV A velocity: 79.00 cm/s MV E/A ratio:  0.94 Fransico Him MD Electronically signed by Fransico Him MD Signature Date/Time: 08/16/2021/9:11:55 AM    Final    VAS US CAROTID  Result Date:  08/16/2021 Carotid  Arterial Duplex Study Patient Name:  Logan Decker  Date of Exam:   08/16/2021 Medical Rec #: [id number]   Accession #:    [id number] Date of Birth: 22-Sep-1961  Patient Gender: M Patient Age:   61 years Exam Location:  [facility] Procedure:      VAS US CAROTID Referring Phys: Nyoka Lint DOUTOVA --------------------------------------------------------------------------------  Indications:       Syncope and Hypotension after dialysis. Risk Factors:      Hypertension, hyperlipidemia. Other Factors:     ESRD. Limitations        Today's exam was limited due to Broken shoulder/sling. Comparison Study:  No prior study Performing Technologist: Sharion Dove RVS  Examination Guidelines: A complete evaluation includes B-mode imaging, spectral Doppler, color Doppler, and power Doppler as needed of all accessible portions of each vessel. Bilateral testing is considered an integral part of a complete examination. Limited examinations for reoccurring indications may be performed as noted.  Right Carotid Findings: +----------+--------+--------+--------+------------------+------------------+           PSV cm/sEDV cm/sStenosisPlaque DescriptionComments           +----------+--------+--------+--------+------------------+------------------+ CCA Prox  104     14                                intimal thickening +----------+--------+--------+--------+------------------+------------------+ CCA Distal100     20                                intimal thickening +----------+--------+--------+--------+------------------+------------------+ ICA Prox  65      14                                                   +----------+--------+--------+--------+------------------+------------------+ ICA Distal55      13                                                   +----------+--------+--------+--------+------------------+------------------+ ECA       109     6                                                     +----------+--------+--------+--------+------------------+------------------+ +----------+--------+-------+------------+-------------------+           PSV cm/sEDV cmsDescribe    Arm Pressure (mmHG) +----------+--------+-------+------------+-------------------+ Subclavian               Not assessed                    +----------+--------+-------+------------+-------------------+ +---------+--------+--+--------+-+ VertebralPSV cm/s30EDV cm/s6 +---------+--------+--+--------+-+  Left Carotid Findings: +----------+--------+--------+--------+------------------+------------------+           PSV cm/sEDV cm/sStenosisPlaque DescriptionComments           +----------+--------+--------+--------+------------------+------------------+ CCA Prox  95      23                                intimal thickening +----------+--------+--------+--------+------------------+------------------+  CCA Distal105     13                                intimal thickening +----------+--------+--------+--------+------------------+------------------+ ICA Prox  65      23                                                   +----------+--------+--------+--------+------------------+------------------+ ICA Distal72      24                                                   +----------+--------+--------+--------+------------------+------------------+ ECA       114     16                                                   +----------+--------+--------+--------+------------------+------------------+ +----------+--------+--------+------------+-------------------+           PSV cm/sEDV cm/sDescribe    Arm Pressure (mmHG) +----------+--------+--------+------------+-------------------+ Subclavian                Not assessed                    +----------+--------+--------+------------+-------------------+ +---------+--------+--+--------+--+ VertebralPSV cm/s44EDV  cm/s16 +---------+--------+--+--------+--+   Summary: Right Carotid: The extracranial vessels were near-normal with only minimal wall                thickening or plaque. Left Carotid: The extracranial vessels were near-normal with only minimal wall               thickening or plaque. Vertebrals:  Bilateral vertebral arteries demonstrate antegrade flow. Subclavians: Not assessed secondary to sling. *See table(s) above for measurements and observations.  Electronically signed by Orlie Pollen on 08/16/2021 at 6:45:25 PM.    Final      LOS: 1 day   Antonieta Pert, MD [facility]  08/17/2021, 1:37 PM

## 2021-08-18 DIAGNOSIS — I10 Essential (primary) hypertension: Secondary | ICD-10-CM | POA: Diagnosis not present

## 2021-08-18 LAB — POTASSIUM
Potassium: 3.9 mmol/L (ref 3.5–5.1)
Potassium: 4.5 mmol/L (ref 3.5–5.1)
Potassium: 4.1 mmol/L (ref 3.5–5.1)
Potassium: 4.7 mmol/L (ref 3.5–5.1)

## 2021-08-18 LAB — HEPATITIS B SURFACE ANTIBODY, QUANTITATIVE: Hep B S AB Quant (Post): >1000 m[IU]/mL (ref >9.9)

## 2021-08-18 MED ORDER — MIDODRINE HCL 5 MG PO TABS
10.0000 mg | ORAL_TABLET | Freq: Three times a day (TID) | ORAL | Status: DC
  Administered 2021-08-18 – 2021-08-19 (×4): 10 mg via ORAL
  Filled 2021-08-18 (×4): qty 2

## 2021-08-18 NOTE — Progress Notes (Signed)
PROGRESS NOTE    Logan Decker  JYN:[id number] DOB: 08/29/1961 DOA: 08/15/2021 PCP: Robert Bellow, PA-C   Chief Complaint  Patient presents with   Loss of Consciousness   Shoulder Injury   Brief Narrative/Hospital Course:  Logan Decker, 60 y.o. male with PMH of ESRD on HD MWF, HLD, history of seizure disorder, history of brain aneurysm presents with fall due to syncope.  He went to hemodialysis and was sitting talking to some friends, he felt lightheaded and sat back down, tried to walk to his room but fainted and passed out hitting the right shoulder would not get up due to pain, he [place] and defecated on himself, felt diaphoretic.[name]  He reported compliance with seizure medication.  He has history of hypotension after dialysis and has been trying to get off of the blood pressure medication. He was seen in the ED and was admitted for further management   Subjective: BP still soft midodrine has been increased. He lives with his mother who is out of town until Monday feels somewhat reluctant to go home  Assessment & Plan:  Syncope likely from orthostatic hypotension Orthostatic hypotension: So far rest of the work-up unremarkable echocardiogram with normal EF moderate LVH, troponin was 9 and had no chest pain.  Underwent EEG that shows cortical dysfunction due to underlying encephalomalacia, no seizure-like activity.  Carotid duplex unremarkable.  Likely due to his hypotension.  Antihypertensive discontinued, blood pressure still soft midodrine was restarted and dose increased to 10 mg today.  Continue PT OT.   Seizure disorder reports being compliant, EEG no seizure-like activity.  Continue his home Keppra.  Monitor.  On ESRD and HD MWF-had HD yesterday, nephrology following.   Anemia of chronic kidney disease hemoglobin stable Volume/hypertension: BP soft, increasing midodrine again. Metabolic bone disease calcium 9.3 continue phosphate binder and Hectorol Hyperkalemia resolved with  treatment, underwent HD  Nondisplaced right proximal humerus fracture seen by orthopedic, nonoperative management continue right shoulder in sling nonweightbearing on right arm follow-up with orthopedic as outpatient  Class I Obesity:Patient's Body mass index is 33.31 kg/m. : Will benefit with PCP follow-up, weight loss  healthy lifestyle and outpatient sleep evaluation.  Deconditioning/debility in the setting of syncope, right humerus fracture.  He lives with his mother who is out of town until Monday.  Continue PT OT anticipate discharge home once blood pressure stable  DVT prophylaxis: SCDs Start: 08/16/21 0520 Code Status:   Code Status: Full Code Family Communication: plan of care discussed with patient at bedside. Status is: Inpatient Remains inpatient appropriate because: For ongoing management of syncope, PT OT evaluation as well as orthostatic vitals monitoring anticipate discharge home once blood pressure stable.  Objective: Vitals last 24 hrs: Vitals:   08/17/21 2100 08/18/21 0100 08/18/21 0546 08/18/21 0749  BP: 98/60  103/60 (!) 99/57  Pulse: 68  64 66  Resp: 18 18 18 16   Temp: 97.6 F (36.4 C)  97.6 F (36.4 C) 98.2 F (36.8 C)  TempSrc: Oral Oral Oral Oral  SpO2: 100%  95% 96%  Weight:      Height:       Weight change: 1.1 kg  Intake/Output Summary (Last 24 hours) at 08/18/2021 1147 Last data filed at 08/17/2021 1334 Gross per 24 hour  Intake --  Output 1000 ml  Net -1000 ml   Net IO Since Admission: -20 mL [08/18/21 1147]   Physical Examination: General exam:AAOx 3,pleasant,older than stated age, weak appearing. HEENT:Oral mucosa moist, Ear/Nose WNL grossly, dentition normal.  Respiratory system: bilaterally clear,no use of accessory muscle Cardiovascular system: S1 & S2 +, No JVD,. Gastrointestinal system: Abdomen soft, NT,ND, BS+ Nervous System:Alert, awake, moving extremities and grossly nonfocal Extremities: No edema, distal peripheral pulses  palpable.  Skin: No rashes,no icterus. MSK: Normal muscle bulk,tone, power.   Medications reviewed:  Scheduled Meds:  allopurinol  100 mg Oral BID   atorvastatin  40 mg Oral Daily   Chlorhexidine Gluconate Cloth  6 each Topical Q0600   doxercalciferol  3 mcg Intravenous Q M,W,F-HD   levETIRAcetam  500 mg Oral BID   midodrine  10 mg Oral TID WC   sodium chloride flush  3 mL Intravenous Q12H   sucroferric oxyhydroxide  1,000 mg Oral TID WC   Continuous Infusions:  sodium chloride      Diet Order             Diet renal with fluid restriction Fluid restriction: 1200 mL Fluid; Room service appropriate? No; Fluid consistency: Thin  Diet effective now                          Weight change: 1.1 kg  Wt Readings from Last 3 Encounters:  08/17/21 93.6 kg  11/10/20 100.2 kg  11/09/20 100.2 kg     Consultants:see note  Procedures:see note Antimicrobials: Anti-infectives (From admission, onward)    None      Culture/Microbiology    Component Value Date/Time   SDES  11/09/2020 1316    URINE, CLEAN CATCH Performed at [facility], [place] [address], [place], [place] [address]    SPECREQUEST  11/09/2020 1316    NONE Performed at [facility], [place] [address] [place], [place] [address]    CULT (A) 11/09/2020 1316    <10,000 COLONIES/mL INSIGNIFICANT GROWTH Performed at [place] [address] [place], [place] [address]    REPTSTATUS 11/11/2020 FINAL 11/09/2020 1316    Other culture-see note  Unresulted Labs (From admission, onward)     Start     Ordered   08/17/21 0354  Potassium  Now then every 4 hours,   STAT (with TIMED occurrences)     Comments: Until normal twice.    08/17/21 0355          Data Reviewed: I have personally reviewed following labs and imaging studies CBC: Recent Labs  Lab 08/15/21 1522 08/16/21 0520 08/17/21 0231 08/17/21 0933  WBC 11.3* 6.6 8.5 7.9  NEUTROABS 9.6* 5.0  --   --    HGB 11.4* 10.1* 10.8* 10.2*  HCT 35.0* 30.0* 31.5* 29.7*  MCV 97.8 97.4 93.5 94.0  PLT 188 140* 162 528    Basic Metabolic Panel: Recent Labs  Lab 08/15/21 1522 08/16/21 0520 08/17/21 0231 08/17/21 0510 08/17/21 0933 08/17/21 1608 08/17/21 1934 08/17/21 2339 08/18/21 0500  NA 140 135 135  --  132*  --   --   --   --   K 3.9 3.8 6.4*   < > 4.2  4.2 4.2 3.9 3.9 4.5  CL 92* 95* 94*  --  89*  --   --   --   --   CO2 35* 31 28  --  31  --   --   --   --   GLUCOSE 87 123* 92  --  90  --   --   --   --   BUN 22* 31* 53*  --  56*  --   --   --   --  CREATININE 6.85* 8.35* 11.48*  --  12.00*  --   --   --   --   CALCIUM 9.6 8.9 9.3  --  8.9  --   --   --   --   MG  --  2.1  --   --   --   --   --   --   --   PHOS  --  3.8  --   --  5.5*  --   --   --   --    < > = values in this interval not displayed.    GFR: Estimated Creatinine Clearance: 7.3 mL/min (A) (by C-G formula based on SCr of 12 mg/dL (H)). Liver Function Tests: Recent Labs  Lab 08/16/21 0520 08/17/21 0933  AST 28  --   ALT 34  --   ALKPHOS 118  --   BILITOT 0.9  --   PROT 6.1*  --   ALBUMIN 3.4* 3.3*    No results for input(s): LIPASE, AMYLASE in the last 168 hours. No results for input(s): AMMONIA in the last 168 hours. Coagulation Profile: No results for input(s): INR, PROTIME in the last 168 hours. Cardiac Enzymes: No results for input(s): CKTOTAL, CKMB, CKMBINDEX, TROPONINI in the last 168 hours. BNP (last 3 results) No results for input(s): PROBNP in the last 8760 hours. HbA1C: No results for input(s): HGBA1C in the last 72 hours. CBG: Recent Labs  Lab 08/15/21 1806 08/16/21 1637  GLUCAP 114* 120*    Lipid Profile: No results for input(s): CHOL, HDL, LDLCALC, TRIG, CHOLHDL, LDLDIRECT in the last 72 hours. Thyroid Function Tests: Recent Labs    08/16/21 0520  TSH 0.634    Anemia Panel: No results for input(s): VITAMINB12, FOLATE, FERRITIN, TIBC, IRON, RETICCTPCT in the last 72  hours. Sepsis Labs: No results for input(s): PROCALCITON, LATICACIDVEN in the last 168 hours.  Recent Results (from the past 240 hour(s))  Resp Panel by RT-PCR (Flu A&B, Covid) Nasopharyngeal Swab     Status: None   Collection Time: 08/15/21 10:30 PM   Specimen: Nasopharyngeal Swab; Nasopharyngeal(NP) swabs in vial transport medium  Result Value Ref Range Status   SARS Coronavirus 2 by RT PCR NEGATIVE NEGATIVE Final    Comment: (NOTE) SARS-CoV-2 target nucleic acids are NOT DETECTED.  The SARS-CoV-2 RNA is generally detectable in upper respiratory specimens during the acute phase of infection. The lowest concentration of SARS-CoV-2 viral copies this assay can detect is 138 copies/mL. A negative result does not preclude SARS-Cov-2 infection and should not be used as the sole basis for treatment or other patient management decisions. A negative result may occur with  improper specimen collection/handling, submission of specimen other than nasopharyngeal swab, presence of viral mutation(s) within the areas targeted by this assay, and inadequate number of viral copies(<138 copies/mL). A negative result must be combined with clinical observations, patient history, and epidemiological information. The expected result is Negative.  Fact Sheet for Patients:  EntrepreneurPulse.com.au  Fact Sheet for Healthcare Providers:  IncredibleEmployment.be  This test is no t yet approved or cleared by the [place] FDA and  has been authorized for detection and/or diagnosis of SARS-CoV-2 by FDA under an Emergency Use Authorization (EUA). This EUA will remain  in effect (meaning this test can be used) for the duration of the COVID-19 declaration under Section 564(b)(1) of the Act, 21 U.S.C.section 360bbb-3(b)(1), unless the authorization is terminated  or revoked sooner.       Influenza  A by PCR NEGATIVE NEGATIVE Final   Influenza B by PCR NEGATIVE  NEGATIVE Final    Comment: (NOTE) The Xpert Xpress SARS-CoV-2/FLU/RSV plus assay is intended as an aid in the diagnosis of influenza from Nasopharyngeal swab specimens and should not be used as a sole basis for treatment. Nasal washings and aspirates are unacceptable for Xpert Xpress SARS-CoV-2/FLU/RSV testing.  Fact Sheet for Patients: EntrepreneurPulse.com.au  Fact Sheet for Healthcare Providers: IncredibleEmployment.be  This test is not yet approved or cleared by the [place] FDA and has been authorized for detection and/or diagnosis of SARS-CoV-2 by FDA under an Emergency Use Authorization (EUA). This EUA will remain in effect (meaning this test can be used) for the duration of the COVID-19 declaration under Section 564(b)(1) of the Act, 21 U.S.C. section 360bbb-3(b)(1), unless the authorization is terminated or revoked.  Performed at [facility], [place] [address], [place], [place] [address]   MRSA Next Gen by PCR, Nasal     Status: None   Collection Time: 08-22-21  6:00 PM   Specimen: Nasal Mucosa; Nasal Swab  Result Value Ref Range Status   MRSA by PCR Next Gen NOT DETECTED NOT DETECTED Final    Comment: (NOTE) The GeneXpert MRSA Assay (FDA approved for NASAL specimens only), is one component of a comprehensive MRSA colonization surveillance program. It is not intended to diagnose MRSA infection nor to guide or monitor treatment for MRSA infections. Test performance is not FDA approved in patients less than 58 years old. Performed at [facility], [place] [address], [place], [place] [address]       Radiology Studies: EEG adult  Result Date: Aug 22, 2021 Lora Havens, MD     Aug 22, 2021  8:42 AM Patient Name: Tiernan Suto MRN: [id number] Epilepsy Attending: Lora Havens Referring Physician/Provider: Dr Eleonore Chiquito Date: 08/16/2021 Duration: 24.30 mins Patient history: 60 year old male with history of epilepsy on  Keppra who presented after an episode of syncope which was associated with bowel and bladder incontinence.  EEG to evaluate for seizure. Level of alertness: Awake AEDs during EEG study: Technical aspects: This EEG study was done with scalp electrodes positioned according to the 10-20 International system of electrode placement. Electrical activity was acquired at a sampling rate of 500Hz  and reviewed with a high frequency filter of 70Hz  and a low frequency filter of 1Hz . EEG data were recorded continuously and digitally stored. Description: The posterior dominant rhythm consists of 8 Hz activity of moderate voltage (25-35 uV) seen predominantly in posterior head regions, symmetric and reactive to eye opening and eye closing.  EEG showed continuous 3 to 6 Hz theta-delta slowing in right hemisphere, maximal right fronto-temporal region.  Hyperventilation and photic stimulation were not performed.   ABNORMALITY -Continuous slow, right hemisphere, maximal right fronto-temporal region IMPRESSION: This study is suggestive of cortical dysfunction in right hemisphere, maximal right frontotemporal region likely secondary to underlying structural abnormality/ encephalomalacia. No seizures or definite epileptiform discharges were seen throughout the recording. Lora Havens     LOS: 2 days   Antonieta Pert, MD [facility]  08/18/2021, 11:47 AM

## 2021-08-18 NOTE — Progress Notes (Signed)
Patient ID: Logan Decker, male   DOB: [birth year], 60 y.o.   MRN: [id number] S: Feeling better today but BP's still soft O:BP (!) 99/57 (BP Location: Left Arm)   Pulse 66   Temp 98.2 F (36.8 C) (Oral)   Resp 16   Ht 5\' 6"  (1.676 m)   Wt 93.6 kg   SpO2 96%   BMI 33.31 kg/m   Intake/Output Summary (Last 24 hours) at 08/18/2021 1029 Last data filed at 08/17/2021 1334 Gross per 24 hour  Intake --  Output 1000 ml  Net -1000 ml   Intake/Output: I/O last 3 completed shifts: In: 240 [P.O.:240] Out: 1000 [Other:1000]  Intake/Output this shift:  No intake/output data recorded. Weight change: 1.1 kg Gen: NAD CVS: RRR Resp: CTA Abd: +BS, soft, NT/ND Ext: no edema, RUE AVF +T/B, right arm in sling  Recent Labs  Lab 08/15/21 1522 08/16/21 0520 08/17/21 0231 08/17/21 0510 08/17/21 0933 08/17/21 1608 08/17/21 1934 08/17/21 2339 08/18/21 0500  NA 140 135 135  --  132*  --   --   --   --   K 3.9 3.8 6.4* 4.1 4.2  4.2 4.2 3.9 3.9 4.5  CL 92* 95* 94*  --  89*  --   --   --   --   CO2 35* 31 28  --  31  --   --   --   --   GLUCOSE 87 123* 92  --  90  --   --   --   --   BUN 22* 31* 53*  --  56*  --   --   --   --   CREATININE 6.85* 8.35* 11.48*  --  12.00*  --   --   --   --   ALBUMIN  --  3.4*  --   --  3.3*  --   --   --   --   CALCIUM 9.6 8.9 9.3  --  8.9  --   --   --   --   PHOS  --  3.8  --   --  5.5*  --   --   --   --   AST  --  28  --   --   --   --   --   --   --   ALT  --  34  --   --   --   --   --   --   --    Liver Function Tests: Recent Labs  Lab 08/16/21 0520 08/17/21 0933  AST 28  --   ALT 34  --   ALKPHOS 118  --   BILITOT 0.9  --   PROT 6.1*  --   ALBUMIN 3.4* 3.3*   No results for input(s): LIPASE, AMYLASE in the last 168 hours. No results for input(s): AMMONIA in the last 168 hours. CBC: Recent Labs  Lab 08/15/21 1522 08/16/21 0520 08/17/21 0231 08/17/21 0933  WBC 11.3* 6.6 8.5 7.9  NEUTROABS 9.6* 5.0  --   --   HGB 11.4* 10.1* 10.8* 10.2*   HCT 35.0* 30.0* 31.5* 29.7*  MCV 97.8 97.4 93.5 94.0  PLT 188 140* 162 177   Cardiac Enzymes: No results for input(s): CKTOTAL, CKMB, CKMBINDEX, TROPONINI in the last 168 hours. CBG: Recent Labs  Lab 08/15/21 1806 08/16/21 1637  GLUCAP 114* 120*    Iron Studies: No results for input(s): IRON, TIBC, TRANSFERRIN, FERRITIN in  the last 72 hours. Studies/Results: EEG adult  Result Date: 08/17/2021 Lora Havens, MD     08/17/2021  8:42 AM Patient Name: Logan Decker MRN: [id number] Epilepsy Attending: Lora Havens Referring Physician/Provider: Dr Eleonore Chiquito Date: 08/16/2021 Duration: 24.30 mins Patient history: 60 year old male with history of epilepsy on Keppra who presented after an episode of syncope which was associated with bowel and bladder incontinence.  EEG to evaluate for seizure. Level of alertness: Awake AEDs during EEG study: Technical aspects: This EEG study was done with scalp electrodes positioned according to the 10-20 International system of electrode placement. Electrical activity was acquired at a sampling rate of 500Hz  and reviewed with a high frequency filter of 70Hz  and a low frequency filter of 1Hz . EEG data were recorded continuously and digitally stored. Description: The posterior dominant rhythm consists of 8 Hz activity of moderate voltage (25-35 uV) seen predominantly in posterior head regions, symmetric and reactive to eye opening and eye closing.  EEG showed continuous 3 to 6 Hz theta-delta slowing in right hemisphere, maximal right fronto-temporal region.  Hyperventilation and photic stimulation were not performed.   ABNORMALITY -Continuous slow, right hemisphere, maximal right fronto-temporal region IMPRESSION: This study is suggestive of cortical dysfunction in right hemisphere, maximal right frontotemporal region likely secondary to underlying structural abnormality/ encephalomalacia. No seizures or definite epileptiform discharges were seen throughout the  recording. Priyanka Barbra Sarks    allopurinol  100 mg Oral BID   atorvastatin  40 mg Oral Daily   Chlorhexidine Gluconate Cloth  6 each Topical Q0600   doxercalciferol  3 mcg Intravenous Q M,W,F-HD   levETIRAcetam  500 mg Oral BID   midodrine  10 mg Oral TID WC   sodium chloride flush  3 mL Intravenous Q12H   sucroferric oxyhydroxide  1,000 mg Oral TID WC    BMET    Component Value Date/Time   NA 132 (L) 08/17/2021 0933   K 4.5 08/18/2021 0500   CL 89 (L) 08/17/2021 0933   CO2 31 08/17/2021 0933   GLUCOSE 90 08/17/2021 0933   BUN 56 (H) 08/17/2021 0933   CREATININE 12.00 (H) 08/17/2021 0933   CALCIUM 8.9 08/17/2021 0933   GFRNONAA 4 (L) 08/17/2021 0933   GFRAA 23 (L) 12/08/2017 0527   CBC    Component Value Date/Time   WBC 7.9 08/17/2021 0933   RBC 3.16 (L) 08/17/2021 0933   HGB 10.2 (L) 08/17/2021 0933   HCT 29.7 (L) 08/17/2021 0933   PLT 177 08/17/2021 0933   MCV 94.0 08/17/2021 0933   MCH 32.3 08/17/2021 0933   MCHC 34.3 08/17/2021 0933   RDW 13.6 08/17/2021 0933   LYMPHSABS 0.8 08/16/2021 0520   MONOABS 0.6 08/16/2021 0520   EOSABS 0.2 08/16/2021 0520   BASOSABS 0.0 08/16/2021 0520      Dialysis Orders: Center: [facility]  on MWF. 4 hours, 180NRe, BFR 400, DFR 800, EDW 91.2Kg, 2K, 2Ca, AVF 15g, no heparin Venofer 100mg  IV q HD- 08/13/21-08/22/21 Hectorol 3 mcg IV q HD   Assessment/Plan:  R proximal humerus fracture: Planned for non-operative management with 4 weeks in a sling. His AVF is in his RUE. I discussed with ortho PA who reports gentle movement of the arm to access his fistula is ok but elbow should remain by his side.  Tolerated dialysis without issue.  ESRD:  MWF, and will continue with that schedule.  Orthostatic hypotension: Pt reports dizziness with standing, worse after dialysis. Had a syncopal episode resulting in fall.  For now, will hold all antihypertensive meds and add back very cautiously if BP increases.  Pt reports that he stood up too quickly when  someone came to the door.  Low BP's in hospital and agree with holding bp meds and slowly resume amlodipine at lower dose of 5 mg if BP starts to rise.  Increase midodrine to 10 mg tid and follow.  ECHO with EF 60-65%, mod LVH, RV function normal. No valvular abnormalities noted but did have borderline aortic dilation.  Carotid duplex unremarkable. Volume: does not appear significantly volume overloaded but feels he does have some extra fluid. UF to EDW as tolerated today, will monitor BP post HD  Anemia: Hgb 10.8. Will plan to resume venofer at discharge.  Metabolic bone disease: Calcium 9.3, no phos reported yet. Resume binders and hectorol.   Nutrition:  Renal diet/ fluid restrictions. No albumin reported yet.   Donetta Potts, MD [employer] [phone]

## 2021-08-18 NOTE — Plan of Care (Signed)
  Problem: Health Behavior/Discharge Planning: Goal: Ability to manage health-related needs will improve Outcome: Progressing   Problem: Clinical Measurements: Goal: Respiratory complications will improve Outcome: Progressing Goal: Cardiovascular complication will be avoided Outcome: Progressing   Problem: Activity: Goal: Risk for activity intolerance will decrease Outcome: Progressing   

## 2021-08-19 DIAGNOSIS — I10 Essential (primary) hypertension: Secondary | ICD-10-CM | POA: Diagnosis not present

## 2021-08-19 LAB — POTASSIUM: Potassium: 5.2 mmol/L — ABNORMAL HIGH (ref 3.5–5.1)

## 2021-08-19 MED ORDER — SODIUM ZIRCONIUM CYCLOSILICATE 10 G PO PACK
10.0000 g | PACK | Freq: Once | ORAL | Status: AC
  Administered 2021-08-19: 10 g via ORAL
  Filled 2021-08-19: qty 1

## 2021-08-19 MED ORDER — MIDODRINE HCL 10 MG PO TABS: 10.0000 mg | ORAL_TABLET | Freq: Three times a day (TID) | ORAL | 0 refills | Status: AC

## 2021-08-19 NOTE — Progress Notes (Signed)
Patient ID: Trueman Worlds, male   DOB: 17-Jul-1961, 60 y.o.   MRN: [id number] S: Feels better today.  No dizziness upon standing. O:BP (!) 135/55 (BP Location: Left Arm)   Pulse 74   Temp 98.2 F (36.8 C) (Oral)   Resp 16   Ht 5\' 6"  (1.676 m)   Wt 93.6 kg   SpO2 97%   BMI 33.31 kg/m   Intake/Output Summary (Last 24 hours) at 08/19/2021 1003 Last data filed at 08/19/2021 0800 Gross per 24 hour  Intake 300 ml  Output 25 ml  Net 275 ml   Intake/Output: I/O last 3 completed shifts: In: 0  Out: 25 [Urine:25]  Intake/Output this shift:  Total I/O In: 300 [P.O.:300] Out: -  Weight change:  Gen:NAD CVS: RRR Resp:CTA Abd: +Bs, soft, Nt/Nd Ext: no edema, LUE AVF +T/B, left arm in sling  Recent Labs  Lab 08/15/21 1522 08/16/21 0520 08/17/21 0231 08/17/21 0510 08/17/21 0933 08/17/21 1608 08/17/21 1934 08/17/21 2339 08/18/21 0500 08/18/21 1039 08/18/21 1637 08/19/21 0031  NA 140 135 135  --  132*  --   --   --   --   --   --   --   K 3.9 3.8 6.4*   < > 4.2  4.2 4.2 3.9 3.9 4.5 4.1 4.7 5.2*  CL 92* 95* 94*  --  89*  --   --   --   --   --   --   --   CO2 35* 31 28  --  31  --   --   --   --   --   --   --   GLUCOSE 87 123* 92  --  90  --   --   --   --   --   --   --   BUN 22* 31* 53*  --  56*  --   --   --   --   --   --   --   CREATININE 6.85* 8.35* 11.48*  --  12.00*  --   --   --   --   --   --   --   ALBUMIN  --  3.4*  --   --  3.3*  --   --   --   --   --   --   --   CALCIUM 9.6 8.9 9.3  --  8.9  --   --   --   --   --   --   --   PHOS  --  3.8  --   --  5.5*  --   --   --   --   --   --   --   AST  --  28  --   --   --   --   --   --   --   --   --   --   ALT  --  34  --   --   --   --   --   --   --   --   --   --    < > = values in this interval not displayed.   Liver Function Tests: Recent Labs  Lab 08/16/21 0520 08/17/21 0933  AST 28  --   ALT 34  --   ALKPHOS 118  --   BILITOT 0.9  --   PROT 6.1*  --  ALBUMIN 3.4* 3.3*   No results for input(s):  LIPASE, AMYLASE in the last 168 hours. No results for input(s): AMMONIA in the last 168 hours. CBC: Recent Labs  Lab 08/15/21 1522 08/16/21 0520 08/17/21 0231 08/17/21 0933  WBC 11.3* 6.6 8.5 7.9  NEUTROABS 9.6* 5.0  --   --   HGB 11.4* 10.1* 10.8* 10.2*  HCT 35.0* 30.0* 31.5* 29.7*  MCV 97.8 97.4 93.5 94.0  PLT 188 140* 162 177   Cardiac Enzymes: No results for input(s): CKTOTAL, CKMB, CKMBINDEX, TROPONINI in the last 168 hours. CBG: Recent Labs  Lab 08/15/21 1806 08/16/21 1637  GLUCAP 114* 120*    Iron Studies: No results for input(s): IRON, TIBC, TRANSFERRIN, FERRITIN in the last 72 hours. Studies/Results: No results found.  allopurinol  100 mg Oral BID   atorvastatin  40 mg Oral Daily   Chlorhexidine Gluconate Cloth  6 each Topical Q0600   doxercalciferol  3 mcg Intravenous Q M,W,F-HD   levETIRAcetam  500 mg Oral BID   midodrine  10 mg Oral TID WC   sodium chloride flush  3 mL Intravenous Q12H   sucroferric oxyhydroxide  1,000 mg Oral TID WC    BMET    Component Value Date/Time   NA 132 (L) 08/17/2021 0933   K 5.2 (H) 08/19/2021 0031   CL 89 (L) 08/17/2021 0933   CO2 31 08/17/2021 0933   GLUCOSE 90 08/17/2021 0933   BUN 56 (H) 08/17/2021 0933   CREATININE 12.00 (H) 08/17/2021 0933   CALCIUM 8.9 08/17/2021 0933   GFRNONAA 4 (L) 08/17/2021 0933   GFRAA 23 (L) 12/08/2017 0527   CBC    Component Value Date/Time   WBC 7.9 08/17/2021 0933   RBC 3.16 (L) 08/17/2021 0933   HGB 10.2 (L) 08/17/2021 0933   HCT 29.7 (L) 08/17/2021 0933   PLT 177 08/17/2021 0933   MCV 94.0 08/17/2021 0933   MCH 32.3 08/17/2021 0933   MCHC 34.3 08/17/2021 0933   RDW 13.6 08/17/2021 0933   LYMPHSABS 0.8 08/16/2021 0520   MONOABS 0.6 08/16/2021 0520   EOSABS 0.2 08/16/2021 0520   BASOSABS 0.0 08/16/2021 0520   Dialysis Orders: Center: [facility]  on MWF. 4 hours, 180NRe, BFR 400, DFR 800, EDW 91.2Kg, 2K, 2Ca, AVF 15g, no heparin Venofer 100mg  IV q HD- 08/13/21-08/22/21 Hectorol 3  mcg IV q HD   Assessment/Plan:  R proximal humerus fracture: Planned for non-operative management with 4 weeks in a sling. His AVF is in his RUE. I discussed with ortho PA who reports gentle movement of the arm to access his fistula is ok but elbow should remain by his side.  Tolerated dialysis without issue.  ESRD:  MWF, and will continue with that schedule.  Orthostatic hypotension: Pt reports dizziness with standing, worse after dialysis. Had a syncopal episode resulting in fall. For now, will hold all antihypertensive meds and add back very cautiously if BP increases.  Pt reports that he stood up too quickly when someone came to the door.  Low BP's in hospital and agree with holding bp meds and slowly resume amlodipine at lower dose of 5 mg if BP starts to rise.  He has responded to the increase of midodrine to 10 mg tid and was not orthostatic.  ECHO with EF 60-65%, mod LVH, RV function normal. No valvular abnormalities noted but did have borderline aortic dilation.  Carotid duplex unremarkable. Volume: does not appear significantly volume overloaded but feels he does have some extra  fluid. UF to EDW as tolerated today, will monitor BP post HD.  Will likely need new edw of 93kg.  Anemia: Hgb 10.8. Will plan to resume venofer at discharge.  Metabolic bone disease: Calcium 9.3, no phos reported yet. Resume binders and hectorol.   Nutrition:  Renal diet/ fluid restrictions. No albumin reported yet.  Disposition - hopeful discharge to home today with [facility] per primary.  Donetta Potts, MD [employer] [phone]

## 2021-08-19 NOTE — Discharge Summary (Signed)
Physician Discharge Summary  Logan Decker RKY:[id number] DOB: 04/03/1961 DOA: 08/15/2021  PCP: Robert Bellow, PA-C  Admit date: 08/15/2021 Discharge date: 08/19/2021  Admitted From: home Disposition:  home  Recommendations for Outpatient Follow-up:  Follow up with PCP and with orthopedic doctor in 1-2 weeks Please continue HD MWF as per schedule  Home Health:no  Equipment/Devices: none  Discharge Condition: Stable Code Status:   Code Status: Full Code Diet recommendation:  Diet Order             Diet renal with fluid restriction Fluid restriction: 1200 mL Fluid; Room service appropriate? No; Fluid consistency: Thin  Diet effective now                    Brief/Interim Summary: 60 y.o. male with PMH of ESRD on HD MWF, HLD, history of seizure disorder, history of brain aneurysm presents with fall due to syncope.  He went to hemodialysis and was sitting talking to some friends, he felt lightheaded and sat back down, tried to walk to his room but fainted and passed out hitting the right shoulder would not get up due to pain, he [place] and defecated on himself, felt diaphoretic.[name]  He reported compliance with seizure medication.  He has history of hypotension after dialysis and has been trying to get off of the blood pressure medication. He was seen in the ED and was admitted for further management. Patient went further work-up with unremarkable echocardiogram carotid duplex EEG. Syncope suspected from orthostatic hypotension antihypertensive discontinued, placed on midodrine at this time blood pressure is stable in 130s.  We will go home and follow-up with his nephrology.  We will continue with his sling on the right proximal humerus fracture and follow-up with orthopedic surgeon who had seen the patient in the hospital.  Discharge Diagnoses:  Syncope likely from orthostatic hypotension Orthostatic hypotension: work-up unremarkable echocardiogram with normal EF moderate LVH,  troponin was 9 and had no chest pain.  Underwent EEG that shows cortical dysfunction due to underlying encephalomalacia, no seizure-like activity.  Carotid duplex unremarkable.  Likely due to his hypotension.  Antihypertensive discontinued, blood pressure now improved to 130s on midodrine.  Asymptomatic and will go home today   Seizure disorder reports being compliant, EEG no seizure-like activity.  Continue his home Keppra.  Monitor.   On ESRD and HD MWF-had HD Friday Next HD tomorrow potassium elevated mildly and getting Lokelma prior to discharge as per nephrology.   Anemia of chronic kidney disease hemoglobin stable Volume/hypertension: BP soft, increasing midodrine again. Metabolic bone disease calcium 9.3 continue phosphate binder and Hectorol  Nondisplaced right proximal humerus fracture seen by orthopedic, nonoperative management continue right shoulder in sling nonweightbearing on right arm follow-up with orthopedic as outpatient   Class I Obesity:Patient's Body mass index is 33.31 kg/m. : Will benefit with PCP follow-up, weight loss  healthy lifestyle and outpatient sleep evaluation.   Deconditioning/debility in the setting of syncope, right humerus fracture.  He wants to go home today family will be staying with him and mother will arrive tomorrow  Consults: Nephrology Orthopedic surgery  Subjective: Is doing well resting comfortably wants to go home his, niece is picking him up and will stay with him until his mother arrives tomorrow Discharge Exam: Vitals:   08/19/21 0411 08/19/21 0823  BP: 105/68 (!) 135/55  Pulse: 65 74  Resp: 18 16  Temp: 97.6 F (36.4 C) 98.2 F (36.8 C)  SpO2: 96% 97%   General: Pt is  alert, awake, not in acute distress Cardiovascular: RRR, S1/S2 +, no rubs, no gallops Respiratory: CTA bilaterally, no wheezing, no rhonchi Abdominal: Soft, NT, ND, bowel sounds + Extremities: no edema, no cyanosis  Discharge Instructions  Discharge  Instructions     Discharge instructions   Complete by: As directed    Please call call MD or return to ER for similar or worsening recurring problem that brought you to hospital or if any fever,nausea/vomiting,abdominal pain, uncontrolled pain, chest pain,  shortness of breath or any other alarming symptoms.  Please follow-up your doctor as instructed in a week time and call the office for appointment. Continue with her dialysis as per schedule.  Follow-up with orthopedic doctor in 1 to 2 weeks regarding your fracture management  Please avoid alcohol, smoking, or any other illicit substance and maintain healthy habits including taking your regular medications as prescribed.  You were cared for by a hospitalist during your hospital stay. If you have any questions about your discharge medications or the care you received while you were in the hospital after you are discharged, you can call the unit and ask to speak with the hospitalist on call if the hospitalist that took care of you is not available.  Once you are discharged, your primary care physician will handle any further medical issues. Please note that NO REFILLS for any discharge medications will be authorized once you are discharged, as it is imperative that you return to your primary care physician (or establish a relationship with a primary care physician if you do not have one) for your aftercare needs so that they can reassess your need for medications and monitor your lab values   Increase activity slowly   Complete by: As directed       Allergies as of 08/19/2021       Reactions   Ace Inhibitors Swelling   *Lotension    Zonisamide Rash        Medication List     STOP taking these medications    amLODipine 10 MG tablet Commonly known as: NORVASC   atenolol 100 MG tablet Commonly known as: TENORMIN   furosemide 40 MG tablet Commonly known as: LASIX   traMADol 50 MG tablet Commonly known as: Ultram        TAKE these medications    allopurinol 100 MG tablet Commonly known as: ZYLOPRIM Take 100 mg by mouth 2 (two) times daily.   atorvastatin 40 MG tablet Commonly known as: LIPITOR Take 40 mg by mouth daily.   ferrous sulfate 325 (65 FE) MG tablet Take 325 mg by mouth See admin instructions. Take one tablet by mouth on Mon, wed and Fridays per patient   HYDROcodone-acetaminophen 5-325 MG tablet Commonly known as: NORCO/VICODIN Take 1 tablet by mouth every 6 (six) hours as needed for severe pain.   levETIRAcetam 500 MG tablet Commonly known as: KEPPRA Take 500 mg by mouth 2 (two) times daily.   lidocaine 5 % Commonly known as: LIDODERM Place 1 patch onto the skin daily.   midodrine 10 MG tablet Commonly known as: PROAMATINE Take 1 tablet (10 mg total) by mouth 3 (three) times daily with meals.   omega-3 acid ethyl esters 1 g capsule Commonly known as: LOVAZA Take 1 g by mouth daily.   Vitamin D-3 125 MCG (5000 UT) Tabs Take 5,000 mg by mouth daily.        Follow-up Information     Robert Bellow, PA-C. Schedule an appointment as  soon as possible for a visit .   Specialty: Internal Medicine Contact information: [address] [place] [address] [phone]         Vanetta Mulders, MD. Schedule an appointment as soon as possible for a visit .   Specialty: Orthopedic Surgery Contact information: [place] [address] [phone]         Robert Bellow, PA-C Follow up in 1 week(s).   Specialty: Internal Medicine Contact information: [address] [place] [address] [phone]                Allergies  Allergen Reactions   Ace Inhibitors Swelling    *Lotension    Zonisamide Rash    The results of significant diagnostics from this hospitalization (including imaging, microbiology, ancillary and laboratory) are listed below for reference.    Microbiology: Recent  Results (from the past 240 hour(s))  Resp Panel by RT-PCR (Flu A&B, Covid) Nasopharyngeal Swab     Status: None   Collection Time: 08/15/21 10:30 PM   Specimen: Nasopharyngeal Swab; Nasopharyngeal(NP) swabs in vial transport medium  Result Value Ref Range Status   SARS Coronavirus 2 by RT PCR NEGATIVE NEGATIVE Final    Comment: (NOTE) SARS-CoV-2 target nucleic acids are NOT DETECTED.  The SARS-CoV-2 RNA is generally detectable in upper respiratory specimens during the acute phase of infection. The lowest concentration of SARS-CoV-2 viral copies this assay can detect is 138 copies/mL. A negative result does not preclude SARS-Cov-2 infection and should not be used as the sole basis for treatment or other patient management decisions. A negative result may occur with  improper specimen collection/handling, submission of specimen other than nasopharyngeal swab, presence of viral mutation(s) within the areas targeted by this assay, and inadequate number of viral copies(<138 copies/mL). A negative result must be combined with clinical observations, patient history, and epidemiological information. The expected result is Negative.  Fact Sheet for Patients:  EntrepreneurPulse.com.au  Fact Sheet for Healthcare Providers:  IncredibleEmployment.be  This test is no t yet approved or cleared by the [place] FDA and  has been authorized for detection and/or diagnosis of SARS-CoV-2 by FDA under an Emergency Use Authorization (EUA). This EUA will remain  in effect (meaning this test can be used) for the duration of the COVID-19 declaration under Section 564(b)(1) of the Act, 21 U.S.C.section 360bbb-3(b)(1), unless the authorization is terminated  or revoked sooner.       Influenza A by PCR NEGATIVE NEGATIVE Final   Influenza B by PCR NEGATIVE NEGATIVE Final    Comment: (NOTE) The Xpert Xpress SARS-CoV-2/FLU/RSV plus assay is intended as an aid in the  diagnosis of influenza from Nasopharyngeal swab specimens and should not be used as a sole basis for treatment. Nasal washings and aspirates are unacceptable for Xpert Xpress SARS-CoV-2/FLU/RSV testing.  Fact Sheet for Patients: EntrepreneurPulse.com.au  Fact Sheet for Healthcare Providers: IncredibleEmployment.be  This test is not yet approved or cleared by the [place] FDA and has been authorized for detection and/or diagnosis of SARS-CoV-2 by FDA under an Emergency Use Authorization (EUA). This EUA will remain in effect (meaning this test can be used) for the duration of the COVID-19 declaration under Section 564(b)(1) of the Act, 21 U.S.C. section 360bbb-3(b)(1), unless the authorization is terminated or revoked.  Performed at [facility], [place] [address] [place], [place] [address]   MRSA Next Gen by PCR, Nasal     Status:  None   Collection Time: 08/17/21  6:00 PM   Specimen: Nasal Mucosa; Nasal Swab  Result Value Ref Range Status   MRSA by PCR Next Gen NOT DETECTED NOT DETECTED Final    Comment: (NOTE) The GeneXpert MRSA Assay (FDA approved for NASAL specimens only), is one component of a comprehensive MRSA colonization surveillance program. It is not intended to diagnose MRSA infection nor to guide or monitor treatment for MRSA infections. Test performance is not FDA approved in patients less than 29 years old. Performed at [facility], [place] [address], [place], [place] [address]     Procedures/Studies: DG Shoulder Right  Result Date: 08/15/2021 CLINICAL DATA:  fall, R shoulder pain EXAM: RIGHT SHOULDER - 2+ VIEW COMPARISON:  None. FINDINGS: There is cortical regularity surrounding the right humeral head with a suspected fracture line visible only on the axillary Y-view. Findings suggest a minimally displaced fracture of the proximal humerus. However definite characterization is suboptimal on provided radiographs.  IMPRESSION: High suspicion for minimally displaced fracture of the proximal humerus. A CT of the right shoulder is recommended for more definite characterization. Electronically Signed   By: Albin Felling M.D.   On: 08/15/2021 16:09   CT Shoulder Right Wo Contrast  Result Date: 08/15/2021 CLINICAL DATA:  Concern for humeral fracture. EXAM: CT OF THE UPPER RIGHT EXTREMITY WITHOUT CONTRAST TECHNIQUE: Multidetector CT imaging of the upper right extremity was performed according to the standard protocol. COMPARISON:  Radiograph dated 08/15/2021. FINDINGS: Bones/Joint/Cartilage There is a nondisplaced fracture of the humeral neck. No dislocation. There is mild joint effusion and edema. No fluid collection. Ligaments Suboptimally assessed by CT. Muscles and Tendons No intramuscular hematoma or fluid collection. Soft tissues No fluid collection. IMPRESSION: Nondisplaced fracture of the humeral neck. No dislocation. Electronically Signed   By: Anner Crete M.D.   On: 08/15/2021 20:10   EEG adult  Result Date: 08/17/2021 Lora Havens, MD     08/17/2021  8:42 AM Patient Name: Logan Decker MRN: [id number] Epilepsy Attending: Lora Havens Referring Physician/Provider: Dr Eleonore Chiquito Date: 08/16/2021 Duration: 24.30 mins Patient history: 60 year old male with history of epilepsy on Keppra who presented after an episode of syncope which was associated with bowel and bladder incontinence.  EEG to evaluate for seizure. Level of alertness: Awake AEDs during EEG study: Technical aspects: This EEG study was done with scalp electrodes positioned according to the 10-20 International system of electrode placement. Electrical activity was acquired at a sampling rate of 500Hz  and reviewed with a high frequency filter of 70Hz  and a low frequency filter of 1Hz . EEG data were recorded continuously and digitally stored. Description: The posterior dominant rhythm consists of 8 Hz activity of moderate voltage (25-35 uV) seen  predominantly in posterior head regions, symmetric and reactive to eye opening and eye closing.  EEG showed continuous 3 to 6 Hz theta-delta slowing in right hemisphere, maximal right fronto-temporal region.  Hyperventilation and photic stimulation were not performed.   ABNORMALITY -Continuous slow, right hemisphere, maximal right fronto-temporal region IMPRESSION: This study is suggestive of cortical dysfunction in right hemisphere, maximal right frontotemporal region likely secondary to underlying structural abnormality/ encephalomalacia. No seizures or definite epileptiform discharges were seen throughout the recording. Lora Havens   ECHOCARDIOGRAM COMPLETE  Result Date: 08/16/2021    ECHOCARDIOGRAM REPORT   Patient Name:   Logan Decker Date of Exam: 08/16/2021 Medical Rec #:  [id number]  Height:       65.0 in Accession #:  [id number] Weight:       201.0 lb Date of Birth:  1961-03-30 BSA:          1.982 m Patient Age:    46 years   BP:           97/65 mmHg Patient Gender: M          HR:           61 bpm. Exam Location:  Inpatient Procedure: 2D Echo, 3D Echo, Cardiac Doppler, Color Doppler and Strain Analysis Indications:    Syncope R55  History:        Patient has no prior history of Echocardiogram examinations.                 Risk Factors:Hypertension and Dyslipidemia. Chronic kidney                 disease, anemia. Gout.  Sonographer:    Darlina Sicilian RDCS Referring Phys: Leslie  1. Left ventricular ejection fraction, by estimation, is 60 to 65%. Left ventricular ejection fraction by 3D volume is 61 %. The left ventricle has normal function. The left ventricle has no regional wall motion abnormalities. There is moderate left ventricular hypertrophy of the basal-septal segment. Left ventricular diastolic parameters were normal. The average left ventricular global longitudinal strain is -21.7 %. The global longitudinal strain is normal.  2. Right ventricular systolic  function is normal. The right ventricular size is normal. Tricuspid regurgitation signal is inadequate for assessing PA pressure.  3. The mitral valve is normal in structure. Trivial mitral valve regurgitation. No evidence of mitral stenosis.  4. The aortic valve is normal in structure. Aortic valve regurgitation is not visualized. No aortic stenosis is present.  5. Aortic dilatation noted. There is borderline dilatation of the ascending aorta, measuring 37 mm.  6. The inferior vena cava is normal in size with greater than 50% respiratory variability, suggesting right atrial pressure of 3 mmHg. FINDINGS  Left Ventricle: Left ventricular ejection fraction, by estimation, is 60 to 65%. Left ventricular ejection fraction by 3D volume is 61 %. The left ventricle has normal function. The left ventricle has no regional wall motion abnormalities. The average left ventricular global longitudinal strain is -21.7 %. The global longitudinal strain is normal. The left ventricular internal cavity size was normal in size. There is moderate left ventricular hypertrophy of the basal-septal segment. Left ventricular diastolic parameters were normal. Normal left ventricular filling pressure. Right Ventricle: The right ventricular size is normal. No increase in right ventricular wall thickness. Right ventricular systolic function is normal. Tricuspid regurgitation signal is inadequate for assessing PA pressure. Left Atrium: Left atrial size was normal in size. Right Atrium: Right atrial size was normal in size. Pericardium: There is no evidence of pericardial effusion. Mitral Valve: The mitral valve is normal in structure. Trivial mitral valve regurgitation. No evidence of mitral valve stenosis. Tricuspid Valve: The tricuspid valve is normal in structure. Tricuspid valve regurgitation is not demonstrated. No evidence of tricuspid stenosis. Aortic Valve: The aortic valve is normal in structure. Aortic valve regurgitation is not  visualized. No aortic stenosis is present. Pulmonic Valve: The pulmonic valve was normal in structure. Pulmonic valve regurgitation is mild. No evidence of pulmonic stenosis. Aorta: Aortic dilatation noted. There is borderline dilatation of the ascending aorta, measuring 37 mm. Venous: The inferior vena cava is normal in size with greater than 50% respiratory variability, suggesting right atrial pressure of 3 mmHg. IAS/Shunts: No atrial level  shunt detected by color flow Doppler.  LEFT VENTRICLE PLAX 2D LVIDd:         5.50 cm         Diastology LVIDs:         3.50 cm         LV e' medial:    8.10 cm/s LV PW:         1.10 cm         LV E/e' medial:  9.2 LV IVS:        1.20 cm         LV e' lateral:   10.10 cm/s LVOT diam:     2.20 cm         LV E/e' lateral: 7.4 LV SV:         82 LV SV Index:   41              2D LVOT Area:     3.80 cm        Longitudinal                                Strain                                2D Strain GLS  -21.4 %                                (A2C):                                2D Strain GLS  -23.5 %                                (A3C):                                2D Strain GLS  -20.2 %                                (A4C):                                2D Strain GLS  -21.7 %                                Avg:                                 3D Volume EF                                LV 3D EF:    Left  ventricul                                             ar                                             ejection                                             fraction                                             by 3D                                             volume is                                             61 %.                                 3D Volume EF:                                3D EF:        61 %                                LV EDV:       168 ml                                LV ESV:       66 ml                                 LV SV:        101 ml RIGHT VENTRICLE RV S prime:     19.00 cm/s TAPSE (M-mode): 2.6 cm LEFT ATRIUM             Index        RIGHT ATRIUM           Index LA diam:        4.90 cm 2.47 cm/m   RA Area:     14.60 cm LA Vol (A2C):   33.7 ml 17.00 ml/m  RA Volume:   30.70 ml  15.49 ml/m LA Vol (A4C):   58.5 ml 29.51 ml/m LA Biplane Vol: 45.1 ml 22.75 ml/m  AORTIC VALVE LVOT Vmax:   106.00 cm/s LVOT Vmean:  70.500 cm/s LVOT VTI:  0.215 m  AORTA Ao Root diam: 3.50 cm Ao Asc diam:  3.70 cm MITRAL VALVE MV Area (PHT): 3.50 cm    SHUNTS MV Decel Time: 217 msec    Systemic VTI:  0.22 m MV E velocity: 74.30 cm/s  Systemic Diam: 2.20 cm MV A velocity: 79.00 cm/s MV E/A ratio:  0.94 Fransico Him MD Electronically signed by Fransico Him MD Signature Date/Time: 08/16/2021/9:11:55 AM    Final    VAS US CAROTID  Result Date: 08/16/2021 Carotid Arterial Duplex Study Patient Name:  Logan Decker  Date of Exam:   08/16/2021 Medical Rec #: [id number]   Accession #:    [id number] Date of Birth: [birth year]  Patient Gender: M Patient Age:   17 years Exam Location:  [facility] Procedure:      VAS US CAROTID Referring Phys: Nyoka Lint DOUTOVA --------------------------------------------------------------------------------  Indications:       Syncope and Hypotension after dialysis. Risk Factors:      Hypertension, hyperlipidemia. Other Factors:     ESRD. Limitations        Today's exam was limited due to Broken shoulder/sling. Comparison Study:  No prior study Performing Technologist: Sharion Dove RVS  Examination Guidelines: A complete evaluation includes B-mode imaging, spectral Doppler, color Doppler, and power Doppler as needed of all accessible portions of each vessel. Bilateral testing is considered an integral part of a complete examination. Limited examinations for reoccurring indications may be performed as noted.  Right Carotid Findings:  +----------+--------+--------+--------+------------------+------------------+           PSV cm/sEDV cm/sStenosisPlaque DescriptionComments           +----------+--------+--------+--------+------------------+------------------+ CCA Prox  104     14                                intimal thickening +----------+--------+--------+--------+------------------+------------------+ CCA Distal100     20                                intimal thickening +----------+--------+--------+--------+------------------+------------------+ ICA Prox  65      14                                                   +----------+--------+--------+--------+------------------+------------------+ ICA Distal55      13                                                   +----------+--------+--------+--------+------------------+------------------+ ECA       109     6                                                    +----------+--------+--------+--------+------------------+------------------+ +----------+--------+-------+------------+-------------------+           PSV cm/sEDV cmsDescribe    Arm Pressure (mmHG) +----------+--------+-------+------------+-------------------+ Subclavian               Not assessed                    +----------+--------+-------+------------+-------------------+ +---------+--------+--+--------+-+  VertebralPSV cm/s30EDV cm/s6 +---------+--------+--+--------+-+  Left Carotid Findings: +----------+--------+--------+--------+------------------+------------------+           PSV cm/sEDV cm/sStenosisPlaque DescriptionComments           +----------+--------+--------+--------+------------------+------------------+ CCA Prox  95      23                                intimal thickening +----------+--------+--------+--------+------------------+------------------+ CCA Distal105     13                                intimal thickening  +----------+--------+--------+--------+------------------+------------------+ ICA Prox  65      23                                                   +----------+--------+--------+--------+------------------+------------------+ ICA Distal72      24                                                   +----------+--------+--------+--------+------------------+------------------+ ECA       114     16                                                   +----------+--------+--------+--------+------------------+------------------+ +----------+--------+--------+------------+-------------------+           PSV cm/sEDV cm/sDescribe    Arm Pressure (mmHG) +----------+--------+--------+------------+-------------------+ Subclavian                Not assessed                    +----------+--------+--------+------------+-------------------+ +---------+--------+--+--------+--+ VertebralPSV cm/s44EDV cm/s16 +---------+--------+--+--------+--+   Summary: Right Carotid: The extracranial vessels were near-normal with only minimal wall                thickening or plaque. Left Carotid: The extracranial vessels were near-normal with only minimal wall               thickening or plaque. Vertebrals:  Bilateral vertebral arteries demonstrate antegrade flow. Subclavians: Not assessed secondary to sling. *See table(s) above for measurements and observations.  Electronically signed by Orlie Pollen on 08/16/2021 at 6:45:25 PM.    Final     Labs: BNP (last 3 results) No results for input(s): BNP in the last 8760 hours. Basic Metabolic Panel: Recent Labs  Lab 08/15/21 1522 08/16/21 0520 08/17/21 0231 08/17/21 0510 08/17/21 0933 08/17/21 1608 08/17/21 2339 08/18/21 0500 08/18/21 1039 08/18/21 1637 08/19/21 0031  NA 140 135 135  --  132*  --   --   --   --   --   --   K 3.9 3.8 6.4*   < > 4.2  4.2   < > 3.9 4.5 4.1 4.7 5.2*  CL 92* 95* 94*  --  89*  --   --   --   --   --   --   CO2 35* 31  28  --  31  --   --   --   --   --   --  GLUCOSE 87 123* 92  --  90  --   --   --   --   --   --   BUN 22* 31* 53*  --  56*  --   --   --   --   --   --   CREATININE 6.85* 8.35* 11.48*  --  12.00*  --   --   --   --   --   --   CALCIUM 9.6 8.9 9.3  --  8.9  --   --   --   --   --   --   MG  --  2.1  --   --   --   --   --   --   --   --   --   PHOS  --  3.8  --   --  5.5*  --   --   --   --   --   --    < > = values in this interval not displayed.   Liver Function Tests: Recent Labs  Lab 08/16/21 0520 08/17/21 0933  AST 28  --   ALT 34  --   ALKPHOS 118  --   BILITOT 0.9  --   PROT 6.1*  --   ALBUMIN 3.4* 3.3*   No results for input(s): LIPASE, AMYLASE in the last 168 hours. No results for input(s): AMMONIA in the last 168 hours. CBC: Recent Labs  Lab 08/15/21 1522 08/16/21 0520 08/17/21 0231 08/17/21 0933  WBC 11.3* 6.6 8.5 7.9  NEUTROABS 9.6* 5.0  --   --   HGB 11.4* 10.1* 10.8* 10.2*  HCT 35.0* 30.0* 31.5* 29.7*  MCV 97.8 97.4 93.5 94.0  PLT 188 140* 162 177   Cardiac Enzymes: No results for input(s): CKTOTAL, CKMB, CKMBINDEX, TROPONINI in the last 168 hours. BNP: Invalid input(s): POCBNP CBG: Recent Labs  Lab 08/15/21 1806 08/16/21 1637  GLUCAP 114* 120*   D-Dimer No results for input(s): DDIMER in the last 72 hours. Hgb A1c No results for input(s): HGBA1C in the last 72 hours. Lipid Profile No results for input(s): CHOL, HDL, LDLCALC, TRIG, CHOLHDL, LDLDIRECT in the last 72 hours. Thyroid function studies No results for input(s): TSH, T4TOTAL, T3FREE, THYROIDAB in the last 72 hours.  Invalid input(s): FREET3 Anemia work up No results for input(s): VITAMINB12, FOLATE, FERRITIN, TIBC, IRON, RETICCTPCT in the last 72 hours. Urinalysis No results found for: COLORURINE, APPEARANCEUR, [place], [place], GLUCOSEU, [place], [place], [place], PROTEINUR, UROBILINOGEN, NITRITE, LEUKOCYTESUR Sepsis Labs Invalid input(s): PROCALCITONIN,  WBC,   LACTICIDVEN Microbiology Recent Results (from the past 240 hour(s))  Resp Panel by RT-PCR (Flu A&B, Covid) Nasopharyngeal Swab     Status: None   Collection Time: 08/15/21 10:30 PM   Specimen: Nasopharyngeal Swab; Nasopharyngeal(NP) swabs in vial transport medium  Result Value Ref Range Status   SARS Coronavirus 2 by RT PCR NEGATIVE NEGATIVE Final    Comment: (NOTE) SARS-CoV-2 target nucleic acids are NOT DETECTED.  The SARS-CoV-2 RNA is generally detectable in upper respiratory specimens during the acute phase of infection. The lowest concentration of SARS-CoV-2 viral copies this assay can detect is 138 copies/mL. A negative result does not preclude SARS-Cov-2 infection and should not be used as the sole basis for treatment or other patient management decisions. A negative result may occur with  improper specimen collection/handling, submission of specimen other than nasopharyngeal swab, presence of viral mutation(s) within the areas targeted by this assay,  and inadequate number of viral copies(<138 copies/mL). A negative result must be combined with clinical observations, patient history, and epidemiological information. The expected result is Negative.  Fact Sheet for Patients:  EntrepreneurPulse.com.au  Fact Sheet for Healthcare Providers:  IncredibleEmployment.be  This test is no t yet approved or cleared by the [place] FDA and  has been authorized for detection and/or diagnosis of SARS-CoV-2 by FDA under an Emergency Use Authorization (EUA). This EUA will remain  in effect (meaning this test can be used) for the duration of the COVID-19 declaration under Section 564(b)(1) of the Act, 21 U.S.C.section 360bbb-3(b)(1), unless the authorization is terminated  or revoked sooner.       Influenza A by PCR NEGATIVE NEGATIVE Final   Influenza B by PCR NEGATIVE NEGATIVE Final    Comment: (NOTE) The Xpert Xpress SARS-CoV-2/FLU/RSV plus  assay is intended as an aid in the diagnosis of influenza from Nasopharyngeal swab specimens and should not be used as a sole basis for treatment. Nasal washings and aspirates are unacceptable for Xpert Xpress SARS-CoV-2/FLU/RSV testing.  Fact Sheet for Patients: EntrepreneurPulse.com.au  Fact Sheet for Healthcare Providers: IncredibleEmployment.be  This test is not yet approved or cleared by the [place] FDA and has been authorized for detection and/or diagnosis of SARS-CoV-2 by FDA under an Emergency Use Authorization (EUA). This EUA will remain in effect (meaning this test can be used) for the duration of the COVID-19 declaration under Section 564(b)(1) of the Act, 21 U.S.C. section 360bbb-3(b)(1), unless the authorization is terminated or revoked.  Performed at [facility], [place] [address], [place], [place] [address]   MRSA Next Gen by PCR, Nasal     Status: None   Collection Time: 08/17/21  6:00 PM   Specimen: Nasal Mucosa; Nasal Swab  Result Value Ref Range Status   MRSA by PCR Next Gen NOT DETECTED NOT DETECTED Final    Comment: (NOTE) The GeneXpert MRSA Assay (FDA approved for NASAL specimens only), is one component of a comprehensive MRSA colonization surveillance program. It is not intended to diagnose MRSA infection nor to guide or monitor treatment for MRSA infections. Test performance is not FDA approved in patients less than 56 years old. Performed at [facility], [place] [address] [place], [place] [address]      Time coordinating discharge: 25 minutes  SIGNED: Antonieta Pert, MD  [facility] 08/19/2021, 10:30 AM  If 7PM-7AM, please contact night-coverage [email or web address]

## 2021-08-19 NOTE — Plan of Care (Signed)

## 2021-08-20 ENCOUNTER — Telehealth: Payer: Self-pay | Admitting: Nurse Practitioner

## 2021-08-20 NOTE — Telephone Encounter (Signed)
Transition of care contact from inpatient facility  Date of discharge: 08/19/2021 Date of contact: 08/20/2021 Method: Phone Spoke to: Patient  Patient contacted to discuss transition of care from recent inpatient hospitalization. Patient was admitted to [facility] from 10/26-10/30/2022 with discharge diagnosis of Nondisplaced right proximal humerus fracture .   Medication changes were reviewed.  Patient will follow up with his/her outpatient HD unit on: 08/20/2021.

## 2021-09-11 ENCOUNTER — Ambulatory Visit (HOSPITAL_BASED_OUTPATIENT_CLINIC_OR_DEPARTMENT_OTHER)
Admission: RE | Admit: 2021-09-11 | Discharge: 2021-09-11 | Disposition: A | Discharge status: Home / Self Care | Payer: Medicare HMO | Source: Ambulatory Visit | Attending: Orthopaedic Surgery | Admitting: Orthopaedic Surgery

## 2021-09-11 ENCOUNTER — Ambulatory Visit (INDEPENDENT_AMBULATORY_CARE_PROVIDER_SITE_OTHER): Payer: Medicare HMO | Admitting: Orthopaedic Surgery

## 2021-09-11 ENCOUNTER — Other Ambulatory Visit: Payer: Self-pay

## 2021-09-11 ENCOUNTER — Other Ambulatory Visit (HOSPITAL_BASED_OUTPATIENT_CLINIC_OR_DEPARTMENT_OTHER): Payer: Self-pay | Admitting: Orthopaedic Surgery

## 2021-09-11 DIAGNOSIS — M25511 Pain in right shoulder: Secondary | ICD-10-CM | POA: Diagnosis not present

## 2021-09-11 DIAGNOSIS — S42294D Other nondisplaced fracture of upper end of right humerus, subsequent encounter for fracture with routine healing: Secondary | ICD-10-CM | POA: Insufficient documentation

## 2021-09-11 NOTE — Progress Notes (Signed)
Chief Complaint: Right shoulder pain     History of Present Illness:   Logan Decker is a 60 y.o. male right-hand-dominant presents for follow-up of a known right proximal humerus fracture.  He initially sustained this back on 26 October with a fall.  He is not since that time has been in a sling that he is created with a scarf.  Overall the pain is very minimal.  He is using a lidocaine patch for the right shoulder.  He has been compliant with nonweightbearing.    Surgical History:   None  PMH/PSH/Family History/Social History/Meds/Allergies:    Past Medical History:  Diagnosis Date   Arthritis    "knees" (08/08/2017)   Brain aneurysm    ruptured brain aneurysm, treated surgically, was very sick, Jan 25, 2002, "almost died"   CKD (chronic kidney disease) stage 4, GFR 15-29 ml/min (HCC)    Gout    High cholesterol    Hypertension    Seizures ([place])    "because of scaring on brain tissue after OR for ruptured aneursym; on daily RX; last sz was in 07/2017" (08/08/2017)   Wears dentures    Past Surgical History:  Procedure Laterality Date   AMPUTATION Right 08/08/2017   Procedure: AMPUTATION right tumb;  Surgeon: Milly Jakob, MD;  Location: [place];  Service: Orthopedics;  Laterality: Right;   AMPUTATION FINGER Right 08/08/2017   thumb   AV FISTULA PLACEMENT Left 03/23/2020   Procedure: LEFT ARM ARTERIOVENOUS (AV) FISTULA CREATION;  Surgeon: Waynetta Sandy, MD;  Location: [place];  Service: Vascular;  Laterality: Left;   CEREBRAL ANEURYSM REPAIR  01-25-02   St. Jude   COLONOSCOPY     FISTULA SUPERFICIALIZATION Left 05/30/2020   Procedure: LEFT BRACHIOCEPHALIC Transposition of FISTULA .;  Surgeon: Waynetta Sandy, MD;  Location: [place];  Service: Vascular;  Laterality: Left;   IR PERC TUN PERIT CATH WO PORT S&I /IMAG  11/14/2020   IR US GUIDE VASC ACCESS RIGHT  11/14/2020   LAPAROSCOPIC NEPHRECTOMY Left 11/10/2020   Procedure: LEFT  LAPAROSCOPIC RADICAL NEPHRECTOMY;  Surgeon: Ardis Hughs, MD;  [facility];  Service: Urology;  Laterality: Left;   MULTIPLE TOOTH EXTRACTIONS     ROBOTIC ASSITED PARTIAL NEPHRECTOMY Right 12/05/2017   Procedure: XI ROBOTIC ASSITED LAPAROSCOPIC RIGHT PARTIAL NEPHRECTOMY;  Surgeon: Ardis Hughs, MD;  [facility];  Service: Urology;  Laterality: Right;   VARICOSE VEIN SURGERY Bilateral    legs   Social History   Socioeconomic History   Marital status: Divorced    Spouse name: Not on file   Number of children: Not on file   Years of education: Not on file   Highest education level: Not on file  Occupational History   Not on file  Tobacco Use   Smoking status: Some Days    Years: 35.00    Types: Cigarettes   Smokeless tobacco: Never  Vaping Use   Vaping Use: Never used  Substance and Sexual Activity   Alcohol use: No   Drug use: No   Sexual activity: Never  Other Topics Concern   Not on file  Social History Narrative   Not on file   Social Determinants of Health   Financial Resource Strain: Not on file  Food Insecurity: Not on file  Transportation Needs: Not  on file  Physical Activity: Not on file  Stress: Not on file  Social Connections: Not on file   Family History  Problem Relation Age of Onset   Renal Cyst Other    Allergies  Allergen Reactions   Ace Inhibitors Swelling    *Lotension    Zonisamide Rash   Current Outpatient Medications  Medication Sig Dispense Refill   allopurinol (ZYLOPRIM) 100 MG tablet Take 100 mg by mouth 2 (two) times daily.     atorvastatin (LIPITOR) 40 MG tablet Take 40 mg by mouth daily.      Cholecalciferol (VITAMIN D-3) 125 MCG (5000 UT) TABS Take 5,000 mg by mouth daily.     ferrous sulfate 325 (65 FE) MG tablet Take 325 mg by mouth See admin instructions. Take one tablet by mouth on Mon, wed and Fridays per patient     HYDROcodone-acetaminophen (NORCO/VICODIN) 5-325 MG tablet Take 1 tablet by mouth every 6  (six) hours as needed for severe pain. 12 tablet 0   levETIRAcetam (KEPPRA) 500 MG tablet Take 500 mg by mouth 2 (two) times daily.     lidocaine (LIDODERM) 5 % Place 1 patch onto the skin daily.     midodrine (PROAMATINE) 10 MG tablet Take 1 tablet (10 mg total) by mouth 3 (three) times daily with meals. 90 tablet 0   omega-3 acid ethyl esters (LOVAZA) 1 g capsule Take 1 g by mouth daily.     No current facility-administered medications for this visit.   No results found.  Review of Systems:   A ROS was performed including pertinent positives and negatives as documented in the HPI.  Physical Exam :   Constitutional: NAD and appears stated age Neurological: Alert and oriented Psych: Appropriate affect and cooperative There were no vitals taken for this visit.   Comprehensive Musculoskeletal Exam:    Tenderness about the right shoulder.  I am able to gently abduct to 20 degrees with forward elevation 30 degrees without any significant motion at the fracture site.  Stable fire biceps and triceps.  Sensation is intact in all distributions of the right arm.  2+ radial pulse  Imaging:   Xray (3 views right shoulder): Minimally displaced right proximal humeral neck fracture with a lesser tuberosity component   I personally reviewed and interpreted the radiographs.   Assessment:   60 year old male with right proximal wrist fracture healing well and essentially anatomic alignment at this time I would like him to work on passive range of motion about the shoulder for an additional 2 weeks he does not become stiff.  At that time we will plan to progress him to active range of motion.  All questions were answered  Plan :    -Return to clinic 4 weeks     I personally saw and evaluated the patient, and participated in the management and treatment plan.  Vanetta Mulders, MD Attending Physician, Orthopedic Surgery  This document was dictated using Dragon voice recognition software. A  reasonable attempt at proof reading has been made to minimize errors.

## 2021-09-18 ENCOUNTER — Other Ambulatory Visit: Payer: Self-pay

## 2021-09-18 ENCOUNTER — Ambulatory Visit (HOSPITAL_BASED_OUTPATIENT_CLINIC_OR_DEPARTMENT_OTHER): Payer: Medicare HMO | Attending: Orthopaedic Surgery | Admitting: Physical Therapy

## 2021-09-18 DIAGNOSIS — M25611 Stiffness of right shoulder, not elsewhere classified: Secondary | ICD-10-CM | POA: Diagnosis present

## 2021-09-18 DIAGNOSIS — G8929 Other chronic pain: Secondary | ICD-10-CM | POA: Diagnosis present

## 2021-09-18 DIAGNOSIS — M6281 Muscle weakness (generalized): Secondary | ICD-10-CM | POA: Insufficient documentation

## 2021-09-18 DIAGNOSIS — M25511 Pain in right shoulder: Secondary | ICD-10-CM | POA: Insufficient documentation

## 2021-09-18 NOTE — Therapy (Signed)
OUTPATIENT PHYSICAL THERAPY SHOULDER EVALUATION   Patient Name: Logan Decker MRN: [id number] DOB:Nov 10, 1960, 60 y.o., male Today's Date: 09/19/2021   PT End of Session - 09/19/21 0836     Visit Number 1    Number of Visits 12    Date for PT Re-Evaluation 10/31/21    Authorization Type [employer]             Past Medical History:  Diagnosis Date   Arthritis    "knees" (08/08/2017)   Brain aneurysm    ruptured brain aneurysm, treated surgically, was very sick, Jan 16, 2002, "almost died"   CKD (chronic kidney disease) stage 4, GFR 15-29 ml/min (HCC)    Gout    High cholesterol    Hypertension    Seizures ([place])    "because of scaring on brain tissue after OR for ruptured aneursym; on daily RX; last sz was in 07/2017" (08/08/2017)   Wears dentures    Past Surgical History:  Procedure Laterality Date   AMPUTATION Right 08/08/2017   Procedure: AMPUTATION right tumb;  Surgeon: Milly Jakob, MD;  Location: [place];  Service: Orthopedics;  Laterality: Right;   AMPUTATION FINGER Right 08/08/2017   thumb   AV FISTULA PLACEMENT Left 03/23/2020   Procedure: LEFT ARM ARTERIOVENOUS (AV) FISTULA CREATION;  Surgeon: Waynetta Sandy, MD;  Location: [place];  Service: Vascular;  Laterality: Left;   CEREBRAL ANEURYSM REPAIR  16-Jan-2002   St. Jude   COLONOSCOPY     FISTULA SUPERFICIALIZATION Left 05/30/2020   Procedure: LEFT BRACHIOCEPHALIC Transposition of FISTULA .;  Surgeon: Waynetta Sandy, MD;  Location: [place];  Service: Vascular;  Laterality: Left;   IR PERC TUN PERIT CATH WO PORT S&I /IMAG  11/14/2020   IR US GUIDE VASC ACCESS RIGHT  11/14/2020   LAPAROSCOPIC NEPHRECTOMY Left 11/10/2020   Procedure: LEFT LAPAROSCOPIC RADICAL NEPHRECTOMY;  Surgeon: Ardis Hughs, MD;  [facility];  Service: Urology;  Laterality: Left;   MULTIPLE TOOTH EXTRACTIONS     ROBOTIC ASSITED PARTIAL NEPHRECTOMY Right 12/05/2017   Procedure: XI ROBOTIC ASSITED LAPAROSCOPIC RIGHT PARTIAL NEPHRECTOMY;   Surgeon: Ardis Hughs, MD;  [facility];  Service: Urology;  Laterality: Right;   VARICOSE VEIN SURGERY Bilateral    legs   Patient Active Problem List   Diagnosis Date Noted   Hypotension 08/16/2021   Syncope 08/15/2021   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[place]) 11/14/2020   ESRD (end stage renal disease) ([place]) 11/13/2020   Dyslipidemia 11/13/2020   Left renal mass 11/10/2020   Polycystic kidney disease 04/06/2018   Renal insufficiency 04/06/2018   Renal mass 12/05/2017   CKD (chronic kidney disease) stage 3, GFR 30-59 ml/min (HCC) 08/11/2017   Acquired complex renal cyst 08/11/2017   Essential hypertension 08/11/2017   Seizure disorder ([place]) 08/11/2017   History of nontraumatic rupture of cerebral aneurysm 08/11/2017   Osteomyelitis ([place]) 08/08/2017    PCP: Robert Bellow, PA-C  REFERRING PROVIDER: Vanetta Mulders, MD  REFERRING DIAG: M25.511 (ICD-10-CM) - Acute pain of right shoulder  THERAPY DIAG:  Right shoulder pain/ Right Shoulder stiffness.    ONSET DATE: 10 /26  SUBJECTIVE:  SUBJECTIVE STATEMENT: Patient had a fall on 08/15/2021. His blood pressure got low following dialysis and he fell breaking his right humerus. It was handled conservatively. He has been put in a sling since that point. He has had no other falls. He feels like the pain has been well controlled.                                                                  PERTINENT HISTORY: Seizures, brain aneurysm; knee arthritis, CKD ( dialysis 3 days a week.   PAIN:  Are you having pain? Yes VAS scale: 6/10 Pain location: anterior right shoulder  Pain orientation: Right  PAIN TYPE: aching Pain description: intermittent  Aggravating factors: use of the shoulder  Relieving factors: not using the shoulder   PRECAUTIONS: Fall and Other: Dialysis   history of seizures   WEIGHT BEARING  RESTRICTIONS No  FALLS:  Has patient fallen in last 6 months? Yes Number of falls: 1 fell after becoming syncopal after dialysis     PLOF: Independent  PATIENT GOALS   To be able to use his right arm   OBJECTIVE:   DIAGNOSTIC FINDINGS:  X-ray: healing well  PATIENT SURVEYS:  FOTO not given 2nd to baseline cognative issues   COGNITION:  Overall cognitive status: Appears to have some short term memory issues at baseline. Patient after checking in tried to get back on transportation to leave without being evaled.      SENSATION:  Has had numbness in the last 3 fingers when on dialysis/Dialysis port is in the right arm   PALPATION: Tender to plapation in the anterior and superior shoulder   POSTURE: Forward head/ rounded shoulders; mild right shoulder elevation   UPPER EXTREMITY AROM/PROM:  AROM Right 09/19/2021 Left 09/19/2021  Shoulder flexion 25 Full   Shoulder extension    Shoulder abduction    Shoulder adduction    Shoulder extension    Shoulder internal rotation Unable to reach behind his back    Shoulder external rotation Unable to reach behind his head    Elbow flexion    Elbow extension    Wrist flexion    Wrist extension    Wrist ulnar deviation    Wrist radial deviation    Wrist pronation    Wrist supination    (Blank rows = not tested)  PROM Right 09/18/2021 Left 09/18/2021  Shoulder flexion 95   Shoulder extension    Shoulder abduction    Shoulder adduction    Shoulder extension    Shoulder internal rotation 40 at 45    Shoulder external rotation 16 at 45 of abduction    Elbow flexion    Elbow extension    Wrist flexion    Wrist extension    Wrist ulnar deviation    Wrist radial deviation    Wrist pronation    Wrist supination    (Blank rows = not tested)   UPPER EXTREMITY MMT:  MMT Right 09/19/2021 Left 09/19/2021  Shoulder flexion 1/5 minimal active movement against gravity    Shoulder extension    Shoulder abduction     Shoulder adduction    Shoulder extension    Middle trapezius    Lower trapezius    Elbow flexion  Elbow extension    Wrist flexion    Wrist extension    Wrist ulnar deviation    Wrist radial deviation    Wrist pronation    Wrist supination    Grip strength    (Blank rows = not tested) Shoulder ER minimal active contraction 1/5  IR: minimal active contraction 1/5    TODAY'S TREATMENT:  PROM into all planes Grade 1 and 2 posterior and inferior glides    Exercises Supine Shoulder External Rotation in 45 Degrees Abduction AAROM with Dowel - 2 x daily - 7 x weekly - 3 sets - 10 reps Supine Shoulder Press AAROM in Abduction with Dowel - 2 x daily - 7 x weekly - 3 sets - 10 reps Seated Shoulder Flexion Towel Slide at Table Top - 2 x daily - 7 x weekly - 3 sets - 10 reps Circular Shoulder Pendulum with Table Support - 2 x daily - 7 x weekly - 3 sets - 10 reps Horizontal Shoulder Pendulum with Table Support - 2 x daily - 7 x weekly - 3 sets - 10 reps   PATIENT EDUCATION: Education details: HEP, symptom management; movement in pain free ranges Person educated: Patient Education method: Explanation, Demonstration, Tactile cues, Verbal cues, and Handouts Education comprehension: verbalized understanding, returned demonstration, verbal cues required, tactile cues required, and needs further education   HOME EXERCISE PROGRAM: Access Code: [email or web address] URL: [email or web address] Date: 09/19/2021 Prepared by: Carolyne Littles  Exercises Supine Shoulder External Rotation in 45 Degrees Abduction AAROM with Dowel - 2 x daily - 7 x weekly - 3 sets - 10 reps Supine Shoulder Press AAROM in Abduction with Dowel - 2 x daily - 7 x weekly - 3 sets - 10 reps Seated Shoulder Flexion Towel Slide at Table Top - 2 x daily - 7 x weekly - 3 sets - 10 reps Circular Shoulder Pendulum with Table Support - 2 x daily - 7 x weekly - 3 sets - 10 reps Horizontal Shoulder Pendulum with Table Support  - 2 x daily - 7 x weekly - 3 sets - 10 reps   ASSESSMENT:  CLINICAL IMPRESSION: Patient is a 60 year old male S/P fall and subsequent right humeral fx on 10/25. He was treated conservatively. He has been using his scarf as a sling. He reports at this time his pain is well controlled. He has significant limitations in active and passive ROM as well as strength and functional use of the right arm. He is right handed. Of note, he also has baseline cognitive issues stemming from an aneurysm in 2003. He can follow all cuing with exercises without a problem.   Objective impairments include decreased activity tolerance, decreased endurance, decreased ROM, decreased strength, impaired flexibility, impaired UE functional use, and pain. These impairments are limiting patient from community activity, meal prep, laundry, and yard work. 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 Patient will benefit from skilled PT to address above impairments and improve overall function.  REHAB POTENTIAL: Good  CLINICAL DECISION MAKING: Evolving/moderate complexity significant loss of function of the arm   EVALUATION COMPLEXITY: Moderate   GOALS: Goals reviewed with patient? Yes  SHORT TERM GOALS:  STG Name Target Date Goal status  1 Patient will increase passive right shoulder flexion and ER by 20 degrees Baseline:  10/10/2021 INITIAL  2 Patient will increase active shoulder flexion to 40 degrees  Baseline:  10/10/2021 INITIAL  3 Patient will be independent with basic AAROM and PROM program  Baseline: 10/10/2021 INITIAL  LONG TERM GOALS:   LTG Name Target Date Goal status  1 Patient will increase active right shoulder flexion to 90 degrees without pain in order to perform ADL's  Baseline: 10/31/2021 INITIAL  2 Patient will use his right arm to feed himself  Baseline: 10/31/2021 INITIAL  3 Patient will reach  behind his head without significant pain in order to perform ADL's  Baseline: 10/31/2021 INITIAL  PLAN: PT FREQUENCY: 2x/week  PT DURATION: 6 weeks  PLANNED INTERVENTIONS: Therapeutic exercises, Therapeutic activity, Neuro Muscular re-education, Balance training, Gait training, Patient/Family education, Joint mobilization, Aquatic Therapy, Dry Needling, Cryotherapy, Moist heat, Taping, Ionotophoresis 4mg /ml Dexamethasone, and Manual therapy  PLAN FOR NEXT SESSION: Continue with gentle PROM and review AAROM. Patient did well with table slides but had trouble with wand press up. Add pulleys; Review pendulums.   Referring diagnosis? M25.511 (ICD-10-CM) - Acute pain of right shoulder Treatment diagnosis? (if different than referring diagnosis) M25.511 (ICD-10-CM) - Acute pain of right shoulder What was this (referring dx) caused by? []  Surgery [x]  Fall []  Ongoing issue []  Arthritis []  Other: ____________  Laterality: [x]  Rt []  Lt []  Both  Check all possible CPT codes:      []  97110 (Therapeutic Exercise)  []  92507 (SLP Treatment)  []  97112 (Neuro Re-ed)   []  92526 (Swallowing Treatment)   []  29924 (Gait Training)   []  [id number] (Cognitive Training, 1st 15 minutes) []  97140 (Manual Therapy)   []  97130 (Cognitive Training, each add'l 15 minutes)  []  97530 (Therapeutic Activities)  []  Other, List CPT Code ____________    []  26834 (Self Care)       [x]  All codes above (97110 - 97535)  []  97012 (Mechanical Traction)  []  97014 (E-stim Unattended)  []  97032 (E-stim manual)  [x]  97033 (Ionto)  []  97035 (Ultrasound)  []  97760 (Orthotic Fit) []  97750 (Physical Performance Training) [x]  [id number] (Aquatic Therapy) []  97034 (Contrast Bath) []  [id number] (Paraffin) []  97597 (Wound Care 1st 20 sq cm) []  97598 (Wound Care each add'l 20 sq cm) []  97016 (Vasopneumatic Device) []  [id number] (Orthotic Training) []  [id number] (Prosthetic Training)   Carney Living PT DPT  09/19/2021, 8:41 AM

## 2021-09-19 ENCOUNTER — Encounter (HOSPITAL_BASED_OUTPATIENT_CLINIC_OR_DEPARTMENT_OTHER): Payer: Self-pay | Admitting: Physical Therapy

## 2021-09-25 ENCOUNTER — Other Ambulatory Visit: Payer: Self-pay

## 2021-09-25 ENCOUNTER — Ambulatory Visit (HOSPITAL_BASED_OUTPATIENT_CLINIC_OR_DEPARTMENT_OTHER): Payer: Medicare HMO | Attending: Orthopaedic Surgery | Admitting: Physical Therapy

## 2021-09-25 ENCOUNTER — Encounter (HOSPITAL_BASED_OUTPATIENT_CLINIC_OR_DEPARTMENT_OTHER): Payer: Self-pay | Admitting: Physical Therapy

## 2021-09-25 DIAGNOSIS — G8929 Other chronic pain: Secondary | ICD-10-CM | POA: Diagnosis present

## 2021-09-25 DIAGNOSIS — M6281 Muscle weakness (generalized): Secondary | ICD-10-CM | POA: Diagnosis present

## 2021-09-25 DIAGNOSIS — M25511 Pain in right shoulder: Secondary | ICD-10-CM | POA: Insufficient documentation

## 2021-09-25 DIAGNOSIS — M25611 Stiffness of right shoulder, not elsewhere classified: Secondary | ICD-10-CM | POA: Diagnosis present

## 2021-09-25 NOTE — Progress Notes (Signed)
OUTPATIENT PHYSICAL THERAPY TREATMENT NOTE   Patient Name: Logan Decker MRN: [id number] DOB:1961-03-08, 60 y.o., male Today's Date: 09/25/2021  PCP: Robert Bellow, PA-C REFERRING PROVIDER: Robert Bellow, PA-C   PT End of Session - 09/25/21 1008     Visit Number 2    Number of Visits 12    Date for PT Re-Evaluation 10/31/21    Authorization Type [employer]    PT Start Time 0930    PT Stop Time 1009    PT Time Calculation (min) 39 min    Activity Tolerance Patient tolerated treatment well    Behavior During Therapy WFL for tasks assessed/performed             Past Medical History:  Diagnosis Date   Arthritis    "knees" (08/08/2017)   Brain aneurysm    ruptured brain aneurysm, treated surgically, was very sick, 2002/01/24, "almost died"   CKD (chronic kidney disease) stage 4, GFR 15-29 ml/min (HCC)    Gout    High cholesterol    Hypertension    Seizures ([place])    "because of scaring on brain tissue after OR for ruptured aneursym; on daily RX; last sz was in 07/2017" (08/08/2017)   Wears dentures    Past Surgical History:  Procedure Laterality Date   AMPUTATION Right 08/08/2017   Procedure: AMPUTATION right tumb;  Surgeon: Milly Jakob, MD;  Location: [place];  Service: Orthopedics;  Laterality: Right;   AMPUTATION FINGER Right 08/08/2017   thumb   AV FISTULA PLACEMENT Left 03/23/2020   Procedure: LEFT ARM ARTERIOVENOUS (AV) FISTULA CREATION;  Surgeon: Waynetta Sandy, MD;  Location: [place];  Service: Vascular;  Laterality: Left;   CEREBRAL ANEURYSM REPAIR  January 24, 2002   [employer]   COLONOSCOPY     FISTULA SUPERFICIALIZATION Left 05/30/2020   Procedure: LEFT BRACHIOCEPHALIC Transposition of FISTULA .;  Surgeon: Waynetta Sandy, MD;  Location: [place];  Service: Vascular;  Laterality: Left;   IR PERC TUN PERIT CATH WO PORT S&I /IMAG  11/14/2020   IR US GUIDE VASC ACCESS RIGHT  11/14/2020   LAPAROSCOPIC NEPHRECTOMY Left 11/10/2020   Procedure: LEFT LAPAROSCOPIC RADICAL  NEPHRECTOMY;  Surgeon: Ardis Hughs, MD;  Location: [facility];  Service: Urology;  Laterality: Left;   MULTIPLE TOOTH EXTRACTIONS     ROBOTIC ASSITED PARTIAL NEPHRECTOMY Right 12/05/2017   Procedure: XI ROBOTIC ASSITED LAPAROSCOPIC RIGHT PARTIAL NEPHRECTOMY;  Surgeon: Ardis Hughs, MD;  Location: [facility];  Service: Urology;  Laterality: Right;   VARICOSE VEIN SURGERY Bilateral    legs   Patient Active Problem List   Diagnosis Date Noted   Hypotension 08/16/2021   Syncope 08/15/2021   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/14/2020   ESRD (end stage renal disease) ([place]) 11/13/2020   Dyslipidemia 11/13/2020   Left renal mass 11/10/2020   Polycystic kidney disease 04/06/2018   Renal insufficiency 04/06/2018   Renal mass 12/05/2017   CKD (chronic kidney disease) stage 3, GFR 30-59 ml/min (HCC) 08/11/2017   Acquired complex renal cyst 08/11/2017   Essential hypertension 08/11/2017   Seizure disorder ([place]) 08/11/2017   History of nontraumatic rupture of cerebral aneurysm 08/11/2017   Osteomyelitis ([place]) 08/08/2017    PCP: Robert Bellow, PA-C   REFERRING PROVIDER: Vanetta Mulders, MD   REFERRING DIAG: M25.511 (ICD-10-CM) - Acute pain of right shoulder   THERAPY DIAG:  Right shoulder pain/ Right Shoulder stiffness.      ONSET DATE: 10 /26   SUBJECTIVE:  SUBJECTIVE STATEMENT:  09/25/21: Pt states that he is still very weak. He is not having pain at this moment. He was able to do some of his exercises but had trouble getting up off the floor. He was able to finally shave with his Rt arm, and this made him happy.                                                              PERTINENT HISTORY: Seizures, brain aneurysm; knee arthritis, CKD ( dialysis 3 days a week.    PAIN:  Are you having pain? No-pt denies pain at this moment VAS scale: 0/10 Pain location:  anterior right shoulder  Pain orientation: Right  PAIN TYPE: aching Pain description: intermittent  Aggravating factors: use of the shoulder  Relieving factors: not using the shoulder    PRECAUTIONS: Fall and Other: Dialysis   history of seizures    WEIGHT BEARING RESTRICTIONS No   FALLS:  Has patient fallen in last 6 months? Yes Number of falls: 1 fell after becoming syncopal after dialysis        PLOF: Independent   PATIENT GOALS    To be able to use his right arm    OBJECTIVE:       UPPER EXTREMITY AROM/PROM:   AROM Right 09/19/2021 Left 09/19/2021  Shoulder flexion 30 Full   Shoulder extension      Shoulder abduction      Shoulder adduction      Shoulder extension      Shoulder internal rotation Unable to reach behind his back     Shoulder external rotation Unable to reach behind his head     Elbow flexion      Elbow extension      Wrist flexion      Wrist extension      Wrist ulnar deviation      Wrist radial deviation      Wrist pronation      Wrist supination      (Blank rows = not tested)    TODAY'S TREATMENT:   Seated- Pulley: Rt UE flexion, scaption x2 min each Scap retraction: 2x10 reps   Supine-    AAROM flexion with 1lb dowel, elbow flexed 2x10 reps    Shoulder press AAROM with 1lb dowel x5 reps to ensure pt is using proper technique    Rt bicep curls 3lb, 2x15 reps    Standing-   Rt UE isometric flexion and abduction only: 5x5 sec hold- pt unable to perform with 50% effort despite PT cuing  Manual Treatment: AAROM flexion x11 reps AAROM shoulder ER/IR 2x15 reps-PT tactile cuing to activate posterior shoulder during ER       Exercises Supine Shoulder External Rotation in 45 Degrees Abduction AAROM with Dowel - 2 x daily - 7 x weekly - 3 sets - 10 reps Supine Shoulder Press AAROM in Abduction with Dowel - 2 x daily - 7 x weekly - 3 sets - 10 reps Seated Shoulder Flexion Towel Slide at Table Top - 2 x daily - 7 x weekly - 3 sets - 10  reps Circular Shoulder Pendulum with Table Support - 2 x daily - 7 x weekly - 3 sets - 10 reps Horizontal Shoulder Pendulum with Table Support - 2 x  daily - 7 x weekly - 3 sets - 10 reps     PATIENT EDUCATION: Education details: HEP technique Person educated: Patient Education method: Explanation, Demonstration, Tactile cues, Verbal cues Education comprehension: verbalized understanding, returned demonstration, verbal cues required, tactile cues required, and needs further education       ASSESSMENT:   CLINICAL IMPRESSION:  Pt arrived without reports of pain. He did note some difficulty trying to carry a 10lb bag of cat litter, but is pleased that he was able to finally shave. Pt reviewed some of his HEP and educated pt on safely progressing activity with his Rt arm. He had some discomfort with his active assist ROM exercises, but reported that this pain would resolve after a short rest period between exercises. PT completed active assist ROM to further increased his shoulder mobility and muscle activation. End of session with mild posterior shoulder discomfort. No HEP updates were made this session due to needing higher levels of PT assistance/cuing.     Objective impairments include decreased activity tolerance, decreased endurance, decreased ROM, decreased strength, impaired flexibility, impaired UE functional use, and pain. These impairments are limiting patient from community activity, meal prep, laundry, and yard work. 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 Patient will benefit from skilled PT to address above impairments and improve overall function.   REHAB POTENTIAL: Good   CLINICAL DECISION MAKING: Evolving/moderate complexity significant loss of function of the arm    EVALUATION COMPLEXITY: Moderate     GOALS: Goals reviewed with patient? Yes   SHORT TERM GOALS:    STG Name Target Date Goal status  1 Patient will increase passive right shoulder flexion and ER by 20 degrees Baseline:  10/10/2021 INITIAL  2 Patient will increase active shoulder flexion to 40 degrees  Baseline:  10/10/2021 INITIAL  3 Patient will be independent with basic AAROM and PROM program  Baseline: 10/10/2021 INITIAL  LONG TERM GOALS:    LTG Name Target Date Goal status  1 Patient will increase active right shoulder flexion to 90 degrees without pain in order to perform ADL's  Baseline: 10/31/2021 INITIAL  2 Patient will use his right arm to feed himself  Baseline: 10/31/2021 INITIAL  3 Patient will reach behind his head without significant pain in order to perform ADL's  Baseline: 10/31/2021 INITIAL  PLAN: PT FREQUENCY: 2x/week   PT DURATION: 6 weeks   PLANNED INTERVENTIONS: Therapeutic exercises, Therapeutic activity, Neuro Muscular re-education, Balance training, Gait training, Patient/Family education, Joint mobilization, Aquatic Therapy, Dry Needling, Cryotherapy, Moist heat, Taping, Ionotophoresis 4mg /ml Dexamethasone, and Manual therapy   PLAN FOR NEXT SESSION: Continue with gentle PROM and AAROM. Progress HEP as able   Referring diagnosis? M25.511 (ICD-10-CM) - Acute pain of right shoulder Treatment diagnosis? (if different than referring diagnosis) M25.511 (ICD-10-CM) - Acute pain of right shoulder What was this (referring dx) caused by? []  Surgery [x]  Fall []  Ongoing issue []  Arthritis []  Other: ____________   Laterality: [x]  Rt []  Lt []  Both   Check all possible CPT codes:                                            []  97110 (Therapeutic Exercise)             []  92507 (SLP Treatment)          []   50037 (Neuro Re-ed)                          []  92526 (Swallowing Treatment)                 []  [phone] (Gait Training)                            []  [phone] (Cognitive Training, 1st 15 minutes) []  97140 (Manual Therapy)                     []  97130 (Cognitive  Training, each add'l 15 minutes)     []  97530 (Therapeutic Activities)            []  Other, List CPT Code ____________              []  50388 (Self Care)                                                     [x]  All codes above (97110 - 97535)          []  97012 (Mechanical Traction)          []  97014 (E-stim Unattended)          []  97032 (E-stim manual)          [x]  97033 (Ionto)          []  97035 (Ultrasound)          []  97760 (Orthotic Fit) []  [id number] (Physical Performance Training) [x]  [id number] (Aquatic Therapy) []  97034 (Contrast Bath) []  [id number] (Paraffin) []  97597 (Wound Care 1st 20 sq cm) []  97598 (Wound Care each add'l 20 sq cm) []  97016 (Vasopneumatic Device) []  [phone] Comptroller) []  [id number] (Prosthetic Training)   10:09 AM,09/25/21 Sherol Dade PT, [place] at [place]

## 2021-10-02 ENCOUNTER — Ambulatory Visit (HOSPITAL_BASED_OUTPATIENT_CLINIC_OR_DEPARTMENT_OTHER): Payer: Medicare HMO | Admitting: Physical Therapy

## 2021-10-02 ENCOUNTER — Telehealth (HOSPITAL_BASED_OUTPATIENT_CLINIC_OR_DEPARTMENT_OTHER): Payer: Self-pay | Admitting: Physical Therapy

## 2021-10-02 NOTE — Telephone Encounter (Signed)
Pt called and spoke with front desk. He is unable to come to his PT appointment today due to transportation arriving late.   9:57 AM,10/02/21 Sherol Dade PT, [place] at [place]

## 2021-10-02 NOTE — Therapy (Incomplete)
PCP: Robert Bellow, PA-C   REFERRING PROVIDER: Vanetta Mulders, MD   REFERRING DIAG: [phone] (ICD-10-CM) - Acute pain of right shoulder   THERAPY DIAG:  Right shoulder pain/ Right Shoulder stiffness.      ONSET DATE: 10 /26   SUBJECTIVE:                                                                                                                    SUBJECTIVE STATEMENT:   09/25/21: Pt states that he is still very weak. He is not having pain at this moment. He was able to do some of his exercises but had trouble getting up off the floor. He was able to finally shave with his Rt arm, and this made him happy.                                                                PERTINENT HISTORY: Seizures, brain aneurysm; knee arthritis, CKD ( dialysis 3 days a week.    PAIN:  Are you having pain? No-pt denies pain at this moment VAS scale: 0/10 Pain location: anterior right shoulder  Pain orientation: Right  PAIN TYPE: aching Pain description: intermittent  Aggravating factors: use of the shoulder  Relieving factors: not using the shoulder    PRECAUTIONS: Fall and Other: Dialysis   history of seizures    WEIGHT BEARING RESTRICTIONS No   FALLS:  Has patient fallen in last 6 months? Yes Number of falls: 1 fell after becoming syncopal after dialysis        PLOF: Independent   PATIENT GOALS    To be able to use his right arm    OBJECTIVE:        UPPER EXTREMITY AROM/PROM:   AROM Right 09/19/2021 Left 09/19/2021  Shoulder flexion 30 Full   Shoulder extension      Shoulder abduction      Shoulder adduction      Shoulder extension      Shoulder internal rotation Unable to reach behind his back     Shoulder external rotation Unable to reach behind his head     Elbow flexion      Elbow extension      Wrist flexion      Wrist extension      Wrist ulnar deviation      Wrist radial deviation      Wrist pronation      Wrist supination      (Blank rows = not  tested)     TODAY'S TREATMENT:              Seated- Pulley: Rt UE flexion, scaption x2 min each Scap retraction: 2x10 reps     Supine-  AAROM flexion with 1lb dowel, elbow flexed 2x10 reps                          Shoulder press AAROM with 1lb dowel x5 reps to ensure pt is using proper technique                          Rt bicep curls 3lb, 2x15 reps                Standing-                         Rt UE isometric flexion and abduction only: 5x5 sec hold- pt unable to perform with 50% effort despite PT cuing   Manual Treatment: AAROM flexion x11 reps AAROM shoulder ER/IR 2x15 reps-PT tactile cuing to activate posterior shoulder during ER         Exercises Supine Shoulder External Rotation in 45 Degrees Abduction AAROM with Dowel - 2 x daily - 7 x weekly - 3 sets - 10 reps Supine Shoulder Press AAROM in Abduction with Dowel - 2 x daily - 7 x weekly - 3 sets - 10 reps Seated Shoulder Flexion Towel Slide at Table Top - 2 x daily - 7 x weekly - 3 sets - 10 reps Circular Shoulder Pendulum with Table Support - 2 x daily - 7 x weekly - 3 sets - 10 reps Horizontal Shoulder Pendulum with Table Support - 2 x daily - 7 x weekly - 3 sets - 10 reps     PATIENT EDUCATION: Education details: HEP technique Person educated: Patient Education method: Explanation, Demonstration, Tactile cues, Verbal cues Education comprehension: verbalized understanding, returned demonstration, verbal cues required, tactile cues required, and needs further education       ASSESSMENT:   CLINICAL IMPRESSION:   Pt arrived without reports of pain. He did note some difficulty trying to carry a 10lb bag of cat litter, but is pleased that he was able to finally shave. Pt reviewed some of his HEP and educated pt on safely progressing activity with his Rt arm. He had some discomfort with his active assist ROM exercises, but reported that this pain would resolve after a short rest period between  exercises. PT completed active assist ROM to further increased his shoulder mobility and muscle activation. End of session with mild posterior shoulder discomfort. No HEP updates were made this session due to needing higher levels of PT assistance/cuing.      Objective impairments include decreased activity tolerance, decreased endurance, decreased ROM, decreased strength, impaired flexibility, impaired UE functional use, and pain. These impairments are limiting patient from community activity, meal prep, laundry, and yard work. 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 Patient will benefit from skilled PT to address above impairments and improve overall function.   REHAB POTENTIAL: Good   CLINICAL DECISION MAKING: Evolving/moderate complexity significant loss of function of the arm    EVALUATION COMPLEXITY: Moderate     GOALS: Goals reviewed with patient? Yes   SHORT TERM GOALS:   STG Name Target Date Goal status  1 Patient will increase passive right shoulder flexion and ER by 20 degrees Baseline:  10/10/2021 INITIAL  2 Patient will increase active shoulder flexion to 40 degrees  Baseline:  10/10/2021 INITIAL  3 Patient will  be independent with basic AAROM and PROM program  Baseline: 10/10/2021 INITIAL  LONG TERM GOALS:    LTG Name Target Date Goal status  1 Patient will increase active right shoulder flexion to 90 degrees without pain in order to perform ADL's  Baseline: 10/31/2021 INITIAL  2 Patient will use his right arm to feed himself  Baseline: 10/31/2021 INITIAL  3 Patient will reach behind his head without significant pain in order to perform ADL's  Baseline: 10/31/2021 INITIAL  PLAN: PT FREQUENCY: 2x/week   PT DURATION: 6 weeks   PLANNED INTERVENTIONS: Therapeutic exercises, Therapeutic activity, Neuro Muscular re-education, Balance training, Gait training,  Patient/Family education, Joint mobilization, Aquatic Therapy, Dry Needling, Cryotherapy, Moist heat, Taping, Ionotophoresis 4mg /ml Dexamethasone, and Manual therapy   PLAN FOR NEXT SESSION: Continue with gentle PROM and AAROM. Progress HEP as able   Referring diagnosis? M25.511 (ICD-10-CM) - Acute pain of right shoulder Treatment diagnosis? (if different than referring diagnosis) M25.511 (ICD-10-CM) - Acute pain of right shoulder What was this (referring dx) caused by? []  Surgery [x]  Fall []  Ongoing issue []  Arthritis []  Other: ____________   Laterality: [x]  Rt []  Lt []  Both   Check all possible CPT codes:                                            []  97110 (Therapeutic Exercise)             []  92507 (SLP Treatment)          []  97112 (Neuro Re-ed)                          []  92526 (Swallowing Treatment)                 []  97116 (Gait Training)                            []  [id number] (Cognitive Training, 1st 15 minutes) []  97140 (Manual Therapy)                     []  97130 (Cognitive Training, each add'l 15 minutes)     []  97530 (Therapeutic Activities)            []  Other, List CPT Code ____________              []  16109 (Self Care)                                                     [x]  All codes above (97110 - 97535)          []  97012 (Mechanical Traction)          []  97014 (E-stim Unattended)          []  97032 (E-stim manual)          [x]  97033 (Ionto)          []  97035 (Ultrasound)          []  97760 (Orthotic Fit) []  97750 (Physical Performance Training) [x]  [id number] (Aquatic Therapy) []  97034 (Contrast Bath) []  [id number] (Paraffin) []  97597 (Wound Care 1st 20 sq cm) []  97598 (  Wound Care each add'l 20 sq cm) []  97016 (Vasopneumatic Device) []  [phone] Comptroller) []  [id number] (Prosthetic Training)

## 2021-10-04 ENCOUNTER — Ambulatory Visit (HOSPITAL_BASED_OUTPATIENT_CLINIC_OR_DEPARTMENT_OTHER): Payer: Medicare HMO | Admitting: Physical Therapy

## 2021-10-04 ENCOUNTER — Ambulatory Visit (HOSPITAL_BASED_OUTPATIENT_CLINIC_OR_DEPARTMENT_OTHER): Payer: Medicare HMO | Admitting: Orthopaedic Surgery

## 2021-10-05 ENCOUNTER — Encounter (HOSPITAL_BASED_OUTPATIENT_CLINIC_OR_DEPARTMENT_OTHER): Payer: Medicare HMO | Admitting: Physical Therapy

## 2021-10-09 ENCOUNTER — Ambulatory Visit (HOSPITAL_BASED_OUTPATIENT_CLINIC_OR_DEPARTMENT_OTHER): Payer: Medicare HMO | Admitting: Physical Therapy

## 2021-10-11 ENCOUNTER — Ambulatory Visit (HOSPITAL_BASED_OUTPATIENT_CLINIC_OR_DEPARTMENT_OTHER): Payer: Medicare HMO | Admitting: Physical Therapy

## 2021-10-11 ENCOUNTER — Other Ambulatory Visit: Payer: Self-pay

## 2021-10-11 ENCOUNTER — Ambulatory Visit (INDEPENDENT_AMBULATORY_CARE_PROVIDER_SITE_OTHER): Payer: Medicare HMO | Admitting: Orthopaedic Surgery

## 2021-10-11 ENCOUNTER — Other Ambulatory Visit (HOSPITAL_BASED_OUTPATIENT_CLINIC_OR_DEPARTMENT_OTHER): Payer: Self-pay | Admitting: Orthopaedic Surgery

## 2021-10-11 ENCOUNTER — Ambulatory Visit (HOSPITAL_BASED_OUTPATIENT_CLINIC_OR_DEPARTMENT_OTHER)
Admission: RE | Admit: 2021-10-11 | Discharge: 2021-10-11 | Disposition: A | Discharge status: Home / Self Care | Payer: Medicare HMO | Source: Ambulatory Visit | Attending: Orthopaedic Surgery | Admitting: Orthopaedic Surgery

## 2021-10-11 ENCOUNTER — Encounter (HOSPITAL_BASED_OUTPATIENT_CLINIC_OR_DEPARTMENT_OTHER): Payer: Self-pay | Admitting: Physical Therapy

## 2021-10-11 DIAGNOSIS — S42294D Other nondisplaced fracture of upper end of right humerus, subsequent encounter for fracture with routine healing: Secondary | ICD-10-CM

## 2021-10-11 DIAGNOSIS — M6281 Muscle weakness (generalized): Secondary | ICD-10-CM

## 2021-10-11 DIAGNOSIS — G8929 Other chronic pain: Secondary | ICD-10-CM

## 2021-10-11 DIAGNOSIS — M25611 Stiffness of right shoulder, not elsewhere classified: Secondary | ICD-10-CM

## 2021-10-11 NOTE — Therapy (Signed)
OUTPATIENT PHYSICAL THERAPY TREATMENT NOTE   Patient Name: Logan Decker MRN: [id number] DOB:1961-05-11, 60 y.o., male Today's Date: 10/11/2021  PCP: Robert Bellow, PA-C REFERRING PROVIDER: Rip Harbour    Past Medical History:  Diagnosis Date   Arthritis    "knees" (08/08/2017)   Brain aneurysm    ruptured brain aneurysm, treated surgically, was very sick, 01/17/02, "almost died"   CKD (chronic kidney disease) stage 4, GFR 15-29 ml/min (HCC)    Gout    High cholesterol    Hypertension    Seizures ([place])    "because of scaring on brain tissue after OR for ruptured aneursym; on daily RX; last sz was in 07/2017" (08/08/2017)   Wears dentures    Past Surgical History:  Procedure Laterality Date   AMPUTATION Right 08/08/2017   Procedure: AMPUTATION right tumb;  Surgeon: Milly Jakob, MD;  Location: [place];  Service: Orthopedics;  Laterality: Right;   AMPUTATION FINGER Right 08/08/2017   thumb   AV FISTULA PLACEMENT Left 03/23/2020   Procedure: LEFT ARM ARTERIOVENOUS (AV) FISTULA CREATION;  Surgeon: Waynetta Sandy, MD;  Location: [place];  Service: Vascular;  Laterality: Left;   CEREBRAL ANEURYSM REPAIR  01/17/02   [employer] Jude   COLONOSCOPY     FISTULA SUPERFICIALIZATION Left 05/30/2020   Procedure: LEFT BRACHIOCEPHALIC Transposition of FISTULA .;  Surgeon: Waynetta Sandy, MD;  Location: [place];  Service: Vascular;  Laterality: Left;   IR PERC TUN PERIT CATH WO PORT S&I /IMAG  11/14/2020   IR US GUIDE VASC ACCESS RIGHT  11/14/2020   LAPAROSCOPIC NEPHRECTOMY Left 11/10/2020   Procedure: LEFT LAPAROSCOPIC RADICAL NEPHRECTOMY;  Surgeon: Ardis Hughs, MD;  [facility];  Service: Urology;  Laterality: Left;   MULTIPLE TOOTH EXTRACTIONS     ROBOTIC ASSITED PARTIAL NEPHRECTOMY Right 12/05/2017   Procedure: XI ROBOTIC ASSITED LAPAROSCOPIC RIGHT PARTIAL NEPHRECTOMY;  Surgeon: Ardis Hughs, MD;  Location[facility];  Service: Urology;  Laterality: Right;    VARICOSE VEIN SURGERY Bilateral    legs   Patient Active Problem List   Diagnosis Date Noted   Hypotension 08/16/2021   Syncope 08/15/2021   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[place]) 11/14/2020   ESRD (end stage renal disease) ([place]) 11/13/2020   Dyslipidemia 11/13/2020   Left renal mass 11/10/2020   Polycystic kidney disease 04/06/2018   Renal insufficiency 04/06/2018   Renal mass 12/05/2017   CKD (chronic kidney disease) stage 3, GFR 30-59 ml/min (HCC) 08/11/2017   Acquired complex renal cyst 08/11/2017   Essential hypertension 08/11/2017   Seizure disorder ([place]) 08/11/2017   History of nontraumatic rupture of cerebral aneurysm 08/11/2017   Osteomyelitis ([place]) 08/08/2017     PCP: Robert Bellow, PA-C   REFERRING PROVIDER: Vanetta Mulders, MD   REFERRING DIAG: M25.511 (ICD-10-CM) - Acute pain of right shoulder   THERAPY DIAG:  Right shoulder pain/ Right Shoulder stiffness.      ONSET DATE: 10 /26   SUBJECTIVE:  SUBJECTIVE STATEMENT:   Patient feels like his shoulder has been getting better.  He has been able to shave. He has been working on moving his shoulder more.                                                               PERTINENT HISTORY: Seizures, brain aneurysm; knee arthritis, CKD ( dialysis 3 days a week.    PAIN:  Are you having pain? No-pt denies pain at this moment VAS scale: 3/10 Pain location: anterior right shoulder  Pain orientation: Right  PAIN TYPE: aching Pain description: intermittent  Aggravating factors: use of the shoulder  Relieving factors: not using the shoulder    PRECAUTIONS: Fall and Other: Dialysis   history of seizures    WEIGHT BEARING RESTRICTIONS No   FALLS:  Has patient fallen in last 6 months? Yes Number of falls: 1 fell after becoming syncopal after dialysis        PLOF: Independent   PATIENT GOALS    To be able  to use his right arm    OBJECTIVE:          TODAY'S TREATMENT:           12/22   Pulley: Rt UE flexion, scaption x2 min each   AAROM flexion with 1lb dowel, elbow flexed 2x10 reps   Standing: patient given for HEP   Scap retraction 2x10 yellow  Shoulder extension 2x10 yellow   Manual Therapy: PROM into flexion, ER, and IR  Garde I and II AP and PA mobilizations Trigger point release to upper trap and levator; large trigger point noted    12/6  Seated- Pulley: Rt UE flexion, scaption x2 min each Scap retraction: 2x10 reps     Supine-                          AAROM flexion with 1lb dowel, elbow flexed 2x10 reps                          Shoulder press AAROM with 1lb dowel x5 reps to ensure pt is using proper technique                          Rt bicep curls 3lb, 2x15 reps                Standing-                         Rt UE isometric flexion and abduction only: 5x5 sec hold- pt unable to perform with 50% effort despite PT cuing   Manual Treatment: AAROM flexion x11 reps AAROM shoulder ER/IR 2x15 reps-PT tactile cuing to activate posterior shoulder during ER         Exercises Supine Shoulder External Rotation in 45 Degrees Abduction AAROM with Dowel - 2 x daily - 7 x weekly - 3 sets - 10 reps Supine Shoulder Press AAROM in Abduction with Dowel - 2 x daily - 7 x weekly - 3 sets - 10 reps Seated Shoulder Flexion Towel Slide at Table Top - 2 x daily - 7 x weekly - 3 sets -  10 reps Circular Shoulder Pendulum with Table Support - 2 x daily - 7 x weekly - 3 sets - 10 reps Horizontal Shoulder Pendulum with Table Support - 2 x daily - 7 x weekly - 3 sets - 10 reps     PATIENT EDUCATION: Education details: HEP technique Person educated: Patient Education method: Explanation, Demonstration, Tactile cues, Verbal cues Education comprehension: verbalized understanding, returned demonstration, verbal cues required, tactile cues required, and needs further education        ASSESSMENT:   CLINICAL IMPRESSION:   Patient has been making good progress.  He had no significant pain with treatment. He did well with new exercises with cuing. He was given a band for home. His range has improved significantly since his first visit. He reports his pain is well controlled. He has been having some transportation issues.    Objective impairments include decreased activity tolerance, decreased endurance, decreased ROM, decreased strength, impaired flexibility, impaired UE functional use, and pain. These impairments are limiting patient from community activity, meal prep, laundry, and yard work. 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 Patient will benefit from skilled PT to address above impairments and improve overall function.   REHAB POTENTIAL: Good   CLINICAL DECISION MAKING: Evolving/moderate complexity significant loss of function of the arm    EVALUATION COMPLEXITY: Moderate     GOALS: Goals reviewed with patient? Yes   SHORT TERM GOALS:   STG Name Target Date Goal status  1 Patient will increase passive right shoulder flexion and ER by 20 degrees Baseline:  10/10/2021 INITIAL  2 Patient will increase active shoulder flexion to 40 degrees  Baseline:  10/10/2021 INITIAL  3 Patient will be independent with basic AAROM and PROM program  Baseline: 10/10/2021 INITIAL  LONG TERM GOALS:    LTG Name Target Date Goal status  1 Patient will increase active right shoulder flexion to 90 degrees without pain in order to perform ADL's  Baseline: 10/31/2021 INITIAL  2 Patient will use his right arm to feed himself  Baseline: 10/31/2021 INITIAL  3 Patient will reach behind his head without significant pain in order to perform ADL's  Baseline: 10/31/2021 INITIAL  PLAN: PT FREQUENCY: 2x/week   PT DURATION: 6 weeks   PLANNED INTERVENTIONS: Therapeutic exercises,  Therapeutic activity, Neuro Muscular re-education, Balance training, Gait training, Patient/Family education, Joint mobilization, Aquatic Therapy, Dry Needling, Cryotherapy, Moist heat, Taping, Ionotophoresis 4mg /ml Dexamethasone, and Manual therapy   PLAN FOR NEXT SESSION: Continue with gentle PROM and AAROM. Progress HEP as able    Carney Living PT DPT  10/11/2021, 11:50 AM

## 2021-10-11 NOTE — Progress Notes (Signed)
Chief Complaint: Right shoulder pain     History of Present Illness:   10/11/2021: Presents today overall doing well.  He has been working with physical therapy twice a week.  He is working on his range of motion.  He is very happy with how things are going  Logan Decker is a 60 y.o. male right-hand-dominant presents for follow-up of a known right proximal humerus fracture.  He initially sustained this back on 26 October with a fall.  He is not since that time has been in a sling that he is created with a scarf.  Overall the pain is very minimal.  He is using a lidocaine patch for the right shoulder.  He has been compliant with nonweightbearing.    Surgical History:   None  PMH/PSH/Family History/Social History/Meds/Allergies:    Past Medical History:  Diagnosis Date   Arthritis    "knees" (08/08/2017)   Brain aneurysm    ruptured brain aneurysm, treated surgically, was very sick, Jan 06, 2002, "almost died"   CKD (chronic kidney disease) stage 4, GFR 15-29 ml/min (HCC)    Gout    High cholesterol    Hypertension    Seizures ([place])    "because of scaring on brain tissue after OR for ruptured aneursym; on daily RX; last sz was in 07/2017" (08/08/2017)   Wears dentures    Past Surgical History:  Procedure Laterality Date   AMPUTATION Right 08/08/2017   Procedure: AMPUTATION right tumb;  Surgeon: Milly Jakob, MD;  Location: [place];  Service: Orthopedics;  Laterality: Right;   AMPUTATION FINGER Right 08/08/2017   thumb   AV FISTULA PLACEMENT Left 03/23/2020   Procedure: LEFT ARM ARTERIOVENOUS (AV) FISTULA CREATION;  Surgeon: Waynetta Sandy, MD;  Location: [place] [facility];  Service: Vascular;  Laterality: Left;   CEREBRAL ANEURYSM REPAIR  01/06/02   [employer]. Jude   COLONOSCOPY     FISTULA SUPERFICIALIZATION Left 05/30/2020   Procedure: LEFT BRACHIOCEPHALIC Transposition of FISTULA .;  Surgeon: Waynetta Sandy, MD;  Location: [place];  Service: Vascular;   Laterality: Left;   IR PERC TUN PERIT CATH WO PORT S&I /IMAG  11/14/2020   IR US GUIDE VASC ACCESS RIGHT  11/14/2020   LAPAROSCOPIC NEPHRECTOMY Left 11/10/2020   Procedure: LEFT LAPAROSCOPIC RADICAL NEPHRECTOMY;  Surgeon: Ardis Hughs, MD;  [facility];  Service: Urology;  Laterality: Left;   MULTIPLE TOOTH EXTRACTIONS     ROBOTIC ASSITED PARTIAL NEPHRECTOMY Right 12/05/2017   Procedure: XI ROBOTIC ASSITED LAPAROSCOPIC RIGHT PARTIAL NEPHRECTOMY;  Surgeon: Ardis Hughs, MD;  [facility];  Service: Urology;  Laterality: Right;   VARICOSE VEIN SURGERY Bilateral    legs   Social History   Socioeconomic History   Marital status: Divorced    Spouse name: Not on file   Number of children: Not on file   Years of education: Not on file   Highest education level: Not on file  Occupational History   Not on file  Tobacco Use   Smoking status: Some Days    Years: 35.00    Types: Cigarettes   Smokeless tobacco: Never  Vaping Use   Vaping Use: Never used  Substance and Sexual Activity   Alcohol use: No   Drug use: No   Sexual activity: Never  Other Topics Concern  Not on file  Social History Narrative   Not on file   Social Determinants of Health   Financial Resource Strain: Not on file  Food Insecurity: Not on file  Transportation Needs: Not on file  Physical Activity: Not on file  Stress: Not on file  Social Connections: Not on file   Family History  Problem Relation Age of Onset   Renal Cyst Other    Allergies  Allergen Reactions   Ace Inhibitors Swelling    *Lotension    Zonisamide Rash   Current Outpatient Medications  Medication Sig Dispense Refill   allopurinol (ZYLOPRIM) 100 MG tablet Take 100 mg by mouth 2 (two) times daily.     atorvastatin (LIPITOR) 40 MG tablet Take 40 mg by mouth daily.      Cholecalciferol (VITAMIN D-3) 125 MCG (5000 UT) TABS Take 5,000 mg by mouth daily.     ferrous sulfate 325 (65 FE) MG tablet Take 325 mg by mouth  See admin instructions. Take one tablet by mouth on Mon, wed and Fridays per patient     HYDROcodone-acetaminophen (NORCO/VICODIN) 5-325 MG tablet Take 1 tablet by mouth every 6 (six) hours as needed for severe pain. 12 tablet 0   levETIRAcetam (KEPPRA) 500 MG tablet Take 500 mg by mouth 2 (two) times daily.     lidocaine (LIDODERM) 5 % Place 1 patch onto the skin daily.     omega-3 acid ethyl esters (LOVAZA) 1 g capsule Take 1 g by mouth daily.     No current facility-administered medications for this visit.   No results found.  Review of Systems:   A ROS was performed including pertinent positives and negatives as documented in the HPI.  Physical Exam :   Constitutional: NAD and appears stated age Neurological: Alert and oriented Psych: Appropriate affect and cooperative There were no vitals taken for this visit.   Comprehensive Musculoskeletal Exam:    Right shoulder moves as a unit.  Passive forward elevation is to 140 without difficulty external rotation into the side is 30 degrees without difficulty.  Sensation is intact in all distributions of the right arm.  2+ radial pulse    Imaging:   Xray (3 views right shoulder): Minimally displaced right proximal humeral neck fracture with a lesser tuberosity component.  There is ongoing callus and healing   I personally reviewed and interpreted the radiographs.   Assessment:   60 year old male with right proximal humerus fracture overall doing very well.  He is 8 weeks out.  At this time he may begin active range of motion.  I have specifically advised him to purchase a over-the-door shoulder pulley to work on isometric strengthening of the cuff overhead.  He was advised specifically on exercises like this.  He will come back and see me in 8 weeks with repeat x-rays  Plan :    -Return to clinic 8 weeks     I personally saw and evaluated the patient, and participated in the management and treatment plan.  Vanetta Mulders,  MD Attending Physician, Orthopedic Surgery  This document was dictated using Dragon voice recognition software. A reasonable attempt at proof reading has been made to minimize errors.

## 2021-10-12 ENCOUNTER — Encounter (HOSPITAL_BASED_OUTPATIENT_CLINIC_OR_DEPARTMENT_OTHER): Payer: Self-pay | Admitting: Physical Therapy

## 2021-10-17 ENCOUNTER — Encounter (HOSPITAL_BASED_OUTPATIENT_CLINIC_OR_DEPARTMENT_OTHER): Payer: Medicare HMO | Admitting: Physical Therapy

## 2021-10-18 ENCOUNTER — Encounter (HOSPITAL_BASED_OUTPATIENT_CLINIC_OR_DEPARTMENT_OTHER): Payer: Self-pay | Admitting: Physical Therapy

## 2021-10-18 ENCOUNTER — Ambulatory Visit (HOSPITAL_BASED_OUTPATIENT_CLINIC_OR_DEPARTMENT_OTHER): Payer: Medicare HMO | Admitting: Physical Therapy

## 2021-10-18 ENCOUNTER — Other Ambulatory Visit: Payer: Self-pay

## 2021-10-18 DIAGNOSIS — M25611 Stiffness of right shoulder, not elsewhere classified: Secondary | ICD-10-CM

## 2021-10-18 DIAGNOSIS — M6281 Muscle weakness (generalized): Secondary | ICD-10-CM

## 2021-10-18 DIAGNOSIS — M25511 Pain in right shoulder: Secondary | ICD-10-CM | POA: Diagnosis not present

## 2021-10-18 DIAGNOSIS — G8929 Other chronic pain: Secondary | ICD-10-CM

## 2021-10-18 NOTE — Therapy (Signed)
OUTPATIENT PHYSICAL THERAPY TREATMENT NOTE   Patient Name: Logan Decker MRN: [id number] DOB:1961/07/13, 60 y.o., male Today's Date: 10/19/2021  PCP: Robert Bellow, PA-C REFERRING PROVIDER: Robert Bellow, PA-C   PT End of Session - 10/18/21 1441     Visit Number 4    Number of Visits 12    Date for PT Re-Evaluation 10/31/21    Authorization Type [employer]    PT Start Time 7026    PT Stop Time 1517    PT Time Calculation (min) 40 min    Activity Tolerance Patient tolerated treatment well    Behavior During Therapy [facility] for tasks assessed/performed             Past Medical History:  Diagnosis Date   Arthritis    "knees" (08/08/2017)   Brain aneurysm    ruptured brain aneurysm, treated surgically, was very sick, 2002-01-07, "almost died"   CKD (chronic kidney disease) stage 4, GFR 15-29 ml/min (HCC)    Gout    High cholesterol    Hypertension    Seizures ([place])    "because of scaring on brain tissue after OR for ruptured aneursym; on daily RX; last sz was in 07/2017" (08/08/2017)   Wears dentures    Past Surgical History:  Procedure Laterality Date   AMPUTATION Right 08/08/2017   Procedure: AMPUTATION right tumb;  Surgeon: Milly Jakob, MD;  Location: [place];  Service: Orthopedics;  Laterality: Right;   AMPUTATION FINGER Right 08/08/2017   thumb   AV FISTULA PLACEMENT Left 03/23/2020   Procedure: LEFT ARM ARTERIOVENOUS (AV) FISTULA CREATION;  Surgeon: Waynetta Sandy, MD;  Location: [place];  Service: Vascular;  Laterality: Left;   CEREBRAL ANEURYSM REPAIR  01-07-2002   [employer]. Jude   COLONOSCOPY     FISTULA SUPERFICIALIZATION Left 05/30/2020   Procedure: LEFT BRACHIOCEPHALIC Transposition of FISTULA .;  Surgeon: Waynetta Sandy, MD;  Location: [place];  Service: Vascular;  Laterality: Left;   IR PERC TUN PERIT CATH WO PORT S&I /IMAG  11/14/2020   IR US GUIDE VASC ACCESS RIGHT  11/14/2020   LAPAROSCOPIC NEPHRECTOMY Left 11/10/2020   Procedure: LEFT LAPAROSCOPIC  RADICAL NEPHRECTOMY;  Surgeon: Ardis Hughs, MD;  [facility];  Service: Urology;  Laterality: Left;   MULTIPLE TOOTH EXTRACTIONS     ROBOTIC ASSITED PARTIAL NEPHRECTOMY Right 12/05/2017   Procedure: XI ROBOTIC ASSITED LAPAROSCOPIC RIGHT PARTIAL NEPHRECTOMY;  Surgeon: Ardis Hughs, MD;  [facility];  Service: Urology;  Laterality: Right;   VARICOSE VEIN SURGERY Bilateral    legs   Patient Active Problem List   Diagnosis Date Noted   Hypotension 08/16/2021   Syncope 08/15/2021   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[place]) 11/14/2020   ESRD (end stage renal disease) ([place]) 11/13/2020   Dyslipidemia 11/13/2020   Left renal mass 11/10/2020   Polycystic kidney disease 04/06/2018   Renal insufficiency 04/06/2018   Renal mass 12/05/2017   CKD (chronic kidney disease) stage 3, GFR 30-59 ml/min (HCC) 08/11/2017   Acquired complex renal cyst 08/11/2017   Essential hypertension 08/11/2017   Seizure disorder ([place]) 08/11/2017   History of nontraumatic rupture of cerebral aneurysm 08/11/2017   Osteomyelitis ([place]) 08/08/2017     PCP: Robert Bellow, PA-C   REFERRING PROVIDER: Vanetta Mulders, MD   REFERRING DIAG: M25.511 (ICD-10-CM) - Acute pain of right shoulder   THERAPY DIAG:  Right shoulder pain/ Right Shoulder stiffness.      ONSET DATE: 10 /26   SUBJECTIVE:  SUBJECTIVE STATEMENT:   Patient saw MD last week and had x rays.  Pt states MD reported his shoulder was healing well and should be good by end of January.  Pt reports his shoulder is improving.  Pt reports improved lifting of R UE and mobility of R shoulder.  He has been able to shave. He is performing laundry.                                                                PERTINENT HISTORY: Seizures, brain aneurysm; knee arthritis, CKD ( dialysis 3 days a week.    PAIN:  Are you having pain? Yes NPRS  scale: 3-4/10 Pain location: anterior and lateral right shoulder  Pain orientation: Right  PAIN TYPE: aching Pain description: intermittent  Aggravating factors: use of the shoulder  Relieving factors: not using the shoulder    PRECAUTIONS: Fall and Other: Dialysis   history of seizures    WEIGHT BEARING RESTRICTIONS No   FALLS:  Has patient fallen in last 6 months? Yes Number of falls: 1 fell after becoming syncopal after dialysis       PLOF: Independent   PATIENT GOALS    To be able to use his right arm    OBJECTIVE:     X rays in Epic indicated:   Similar-appearing trace callus formation surrounding a comminuted right humeral head/neck fracture.     TODAY'S TREATMENT:  Therapeutic Exercise:   -Reviewed response to prior Rx, HEP compliance, pain level, and current function.       -Pt received R shoulder PROM in flex, scaption, ER, and IR in supine per pt tol and tissue tolerance.    -Pt performed:  -supine wand flexion 2x10 reps  -supine wand ER 2x10 reps  -seated wand ER 2x10 reps  -seated pulleys in flexion and scaption  -Scap retraction: 2x10 reps  -seated table slides in flexion          Exercises Supine Shoulder External Rotation in 45 Degrees Abduction AAROM with Dowel - 2 x daily - 7 x weekly - 3 sets - 10 reps Supine Shoulder Press AAROM in Abduction with Dowel - 2 x daily - 7 x weekly - 3 sets - 10 reps Seated Shoulder Flexion Towel Slide at Table Top - 2 x daily - 7 x weekly - 3 sets - 10 reps Circular Shoulder Pendulum with Table Support - 2 x daily - 7 x weekly - 3 sets - 10 reps Horizontal Shoulder Pendulum with Table Support - 2 x daily - 7 x weekly - 3 sets - 10 reps     PATIENT EDUCATION: Education details:  Exercise form Person educated: Patient Education method: Explanation, Demonstration, Tactile cues, Verbal cues Education comprehension: verbalized understanding, returned demonstration, verbal cues required, tactile cues required, and needs  further education       ASSESSMENT:   CLINICAL IMPRESSION:  Pt is improving with function as evidenced by subjective reports.  Pt requires cuing and instruction in correct form of exercises.  He has his elbow flexed through a lot of the AAROM elevation exercises.  Pt tolerated PROM well and is progressing with R shoulder ROM.  Pt responded well to Rx reporting increased pain to 5-6/10 pain after Rx and stated he feels  ok.  Pt should cont to benefit from cont skilled PT services to address ongoing goals and to restore desired level of function.    Objective impairments include decreased activity tolerance, decreased endurance, decreased ROM, decreased strength, impaired flexibility, impaired UE functional use, and pain. These impairments are limiting patient from community activity, meal prep, laundry, and yard work. 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 Patient will benefit from skilled PT to address above impairments and improve overall function.   REHAB POTENTIAL: Good   CLINICAL DECISION MAKING: Evolving/moderate complexity significant loss of function of the arm    EVALUATION COMPLEXITY: Moderate     GOALS:    SHORT TERM GOALS:   STG Name Target Date Goal status  1 Patient will increase passive right shoulder flexion and ER by 20 degrees Baseline:  10/10/2021 INITIAL  2 Patient will increase active shoulder flexion to 40 degrees  Baseline:  10/10/2021 INITIAL  3 Patient will be independent with basic AAROM and PROM program  Baseline: 10/10/2021 INITIAL  LONG TERM GOALS:    LTG Name Target Date Goal status  1 Patient will increase active right shoulder flexion to 90 degrees without pain in order to perform ADL's  Baseline: 10/31/2021 INITIAL  2 Patient will use his right arm to feed himself  Baseline: 10/31/2021 INITIAL  3 Patient will reach behind his head without  significant pain in order to perform ADL's  Baseline: 10/31/2021 INITIAL  PLAN: PT FREQUENCY: 2x/week   PT DURATION: 6 weeks   PLANNED INTERVENTIONS: Therapeutic exercises, Therapeutic activity, Neuro Muscular re-education, Balance training, Gait training, Patient/Family education, Joint mobilization, Aquatic Therapy, Dry Needling, Cryotherapy, Moist heat, Taping, Ionotophoresis 4mg /ml Dexamethasone, and Manual therapy   PLAN FOR NEXT SESSION: Continue with ther ex and ROM per healing constraints.  Progress HEP as able.    Selinda Michaels III PT, DPT 10/19/21 11:06 AM

## 2021-10-19 ENCOUNTER — Encounter (HOSPITAL_BASED_OUTPATIENT_CLINIC_OR_DEPARTMENT_OTHER): Payer: Medicare HMO | Admitting: Physical Therapy

## 2021-10-23 ENCOUNTER — Ambulatory Visit (HOSPITAL_BASED_OUTPATIENT_CLINIC_OR_DEPARTMENT_OTHER): Payer: Medicare HMO | Attending: Orthopaedic Surgery | Admitting: Physical Therapy

## 2021-10-23 ENCOUNTER — Other Ambulatory Visit: Payer: Self-pay

## 2021-10-23 DIAGNOSIS — G8929 Other chronic pain: Secondary | ICD-10-CM

## 2021-10-23 DIAGNOSIS — M25511 Pain in right shoulder: Secondary | ICD-10-CM | POA: Diagnosis present

## 2021-10-23 DIAGNOSIS — M25611 Stiffness of right shoulder, not elsewhere classified: Secondary | ICD-10-CM | POA: Diagnosis present

## 2021-10-23 DIAGNOSIS — M6281 Muscle weakness (generalized): Secondary | ICD-10-CM | POA: Diagnosis present

## 2021-10-23 NOTE — Therapy (Signed)
OUTPATIENT PHYSICAL THERAPY TREATMENT NOTE   Patient Name: Logan Decker MRN: [id number] DOB:05-28-1961, 61 y.o., male Today's Date: 10/24/2021  PCP: Robert Bellow, PA-C REFERRING PROVIDER: Robert Bellow, PA-C   PT End of Session - 10/24/21 1001     Visit Number 5    Number of Visits 12    Date for PT Re-Evaluation 10/31/21    Authorization Type [employer]    PT Start Time 1761   Patient 15 minutes late   PT Stop Time 01-21-1513    PT Time Calculation (min) 29 min    Activity Tolerance Patient tolerated treatment well    Behavior During Therapy WFL for tasks assessed/performed             Past Medical History:  Diagnosis Date   Arthritis    "knees" (08/08/2017)   Brain aneurysm    ruptured brain aneurysm, treated surgically, was very sick, 2002-01-21, "almost died"   CKD (chronic kidney disease) stage 4, GFR 15-29 ml/min (HCC)    Gout    High cholesterol    Hypertension    Seizures ([place])    "because of scaring on brain tissue after OR for ruptured aneursym; on daily RX; last sz was in 07/2017" (08/08/2017)   Wears dentures    Past Surgical History:  Procedure Laterality Date   AMPUTATION Right 08/08/2017   Procedure: AMPUTATION right tumb;  Surgeon: Milly Jakob, MD;  Location: [facility];  Service: Orthopedics;  Laterality: Right;   AMPUTATION FINGER Right 08/08/2017   thumb   AV FISTULA PLACEMENT Left 03/23/2020   Procedure: LEFT ARM ARTERIOVENOUS (AV) FISTULA CREATION;  Surgeon: Waynetta Sandy, MD;  Location: [facility];  Service: Vascular;  Laterality: Left;   CEREBRAL ANEURYSM REPAIR  Jan 21, 2002   [employer]   COLONOSCOPY     FISTULA SUPERFICIALIZATION Left 05/30/2020   Procedure: LEFT BRACHIOCEPHALIC Transposition of FISTULA .;  Surgeon: Waynetta Sandy, MD;  Location: [facility];  Service: Vascular;  Laterality: Left;   IR PERC TUN PERIT CATH WO PORT S&I /IMAG  11/14/2020   IR US GUIDE VASC ACCESS RIGHT  11/14/2020   LAPAROSCOPIC NEPHRECTOMY Left 11/10/2020   Procedure:  LEFT LAPAROSCOPIC RADICAL NEPHRECTOMY;  Surgeon: Ardis Hughs, MD;  [facility];  Service: Urology;  Laterality: Left;   MULTIPLE TOOTH EXTRACTIONS     ROBOTIC ASSITED PARTIAL NEPHRECTOMY Right 12/05/2017   Procedure: XI ROBOTIC ASSITED LAPAROSCOPIC RIGHT PARTIAL NEPHRECTOMY;  Surgeon: Ardis Hughs, MD;  Location: [facility];  Service: Urology;  Laterality: Right;   VARICOSE VEIN SURGERY Bilateral    legs   Patient Active Problem List   Diagnosis Date Noted   Hypotension 08/16/2021   Syncope 08/15/2021   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/14/2020   ESRD (end stage renal disease) ([place]) 11/13/2020   Dyslipidemia 11/13/2020   Left renal mass 11/10/2020   Polycystic kidney disease 04/06/2018   Renal insufficiency 04/06/2018   Renal mass 12/05/2017   CKD (chronic kidney disease) stage 3, GFR 30-59 ml/min (HCC) 08/11/2017   Acquired complex renal cyst 08/11/2017   Essential hypertension 08/11/2017   Seizure disorder ([place]) 08/11/2017   History of nontraumatic rupture of cerebral aneurysm 08/11/2017   Osteomyelitis ([place]) 08/08/2017    PCP: Robert Bellow, PA-C   REFERRING PROVIDER: Vanetta Mulders, MD   REFERRING DIAG: M25.511 (ICD-10-CM) - Acute pain of right shoulder   THERAPY DIAG:  Right shoulder pain/ Right Shoulder stiffness.      ONSET DATE: 10 /26  SUBJECTIVE:                                                                                                                    SUBJECTIVE STATEMENT:   Patient reports, he continues to use his arm more and more. He is having very little pain.                                                              PERTINENT HISTORY: Seizures, brain aneurysm; knee arthritis, CKD ( dialysis 3 days a week.    PAIN:  Are you having pain? Yes NPRS scale: 3-4/10 Pain location: anterior and lateral right shoulder  Pain orientation: Right  PAIN TYPE: aching Pain description: intermittent  Aggravating factors: use of the  shoulder  Relieving factors: not using the shoulder    PRECAUTIONS: Fall and Other: Dialysis   history of seizures    WEIGHT BEARING RESTRICTIONS No   FALLS:  Has patient fallen in last 6 months? Yes Number of falls: 1 fell after becoming syncopal after dialysis       PLOF: Independent   PATIENT GOALS    To be able to use his right arm    OBJECTIVE:     X rays in Epic indicated:   Similar-appearing trace callus formation surrounding a comminuted right humeral head/neck fracture.     TODAY'S TREATMENT:  1/4 Therapeutic Exercise:   -Reviewed response to prior Rx, HEP compliance, pain level, and current function.       -manual therapy: Pt received R shoulder PROM in flex, scaption, ER, and IR in supine per pt tol and tissue tolerance.    -Pt performed:             -supine wand flexion 2x10 reps             -supine wand ER 2x10 reps             -seated wand ER 2x10 reps             -seated pulleys in flexion and scaption             -Scap retraction: 2x10 reps red  - shoulder extension 2x10 red              -seated table slides in flexion Added: side lying ER no weight 2x10  Band IR red 2x10        12/29  Therapeutic Exercise:   -Reviewed response to prior Rx, HEP compliance, pain level, and current function.       -Pt received R shoulder PROM in flex, scaption, ER, and IR in supine per pt tol and tissue tolerance.    -Pt performed:             -supine wand flexion 2x10 reps             -  supine wand ER 2x10 reps             -seated wand ER 2x10 reps             -seated pulleys in flexion and scaption             -Scap retraction: 2x10 reps             -seated table slides in flexion           Exercises Supine Shoulder External Rotation in 45 Degrees Abduction AAROM with Dowel - 2 x daily - 7 x weekly - 3 sets - 10 reps Supine Shoulder Press AAROM in Abduction with Dowel - 2 x daily - 7 x weekly - 3 sets - 10 reps Seated Shoulder Flexion Towel Slide at Table Top -  2 x daily - 7 x weekly - 3 sets - 10 reps Circular Shoulder Pendulum with Table Support - 2 x daily - 7 x weekly - 3 sets - 10 reps Horizontal Shoulder Pendulum with Table Support - 2 x daily - 7 x weekly - 3 sets - 10 reps     PATIENT EDUCATION: Education details:  Exercise form Person educated: Patient Education method: Explanation, Demonstration, Tactile cues, Verbal cues Education comprehension: verbalized understanding, returned demonstration, verbal cues required, tactile cues required, and needs further education       ASSESSMENT:   CLINICAL IMPRESSION: Patient continues to make good progress. Per visual inspection his PROM into flexion is improving. His PROM into ER has improved to 53 degrees without pain. At this time he is having diallyls 3x a week and PT 2x a week. He is progressing well so we offered to reduce him to 1x a week for PT. He feels like this may be helpful. He reports he will be complaint with his HEP. Therapy added in side lying er today as well as standing IR. He was advanced to a red band.   Objective impairments include decreased activity tolerance, decreased endurance, decreased ROM, decreased strength, impaired flexibility, impaired UE functional use, and pain. These impairments are limiting patient from community activity, meal prep, laundry, and yard work. 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 Patient will benefit from skilled PT to address above impairments and improve overall function.   REHAB POTENTIAL: Good   CLINICAL DECISION MAKING: Evolving/moderate complexity significant loss of function of the arm    EVALUATION COMPLEXITY: Moderate     GOALS:     SHORT TERM GOALS:   STG Name Target Date Goal status  1 Patient will increase passive right shoulder flexion and ER by 20 degrees Baseline:  10/10/2021 Improved to 53 degrees met 1/4  2  Patient will increase active shoulder flexion to 40 degrees  Baseline:  10/10/2021 Improved to 90 degrees  Achieved  1/4   3 Patient will be independent with basic AAROM and PROM program  Baseline: 10/10/2021 Independent; patient reduced to 1x a week to work on [employer] 1/4  LONG TERM GOALS:    LTG Name Target Date Goal status  1 Patient will increase active right shoulder flexion to 90 degrees without pain in order to perform ADL's  Baseline: 10/31/2021 INITIAL  2 Patient will use his right arm to feed himself  Baseline: 10/31/2021 INITIAL  3 Patient will reach behind his head without significant pain in order to perform ADL's  Baseline: 10/31/2021 INITIAL  PLAN: PT FREQUENCY: 2x/week   PT DURATION: 6 weeks   PLANNED INTERVENTIONS: Therapeutic exercises, Therapeutic activity, Neuro Muscular re-education, Balance training, Gait training, Patient/Family education, Joint mobilization, Aquatic Therapy, Dry Needling, Cryotherapy, Moist heat, Taping, Ionotophoresis 78m/ml Dexamethasone, and Manual therapy   PLAN FOR NEXT SESSION: Continue with ther ex and ROM per healing constraints.  Progress HEP as able.      DCarney LivingPT DPT  10/24/2021, 10:03 AM

## 2021-10-24 ENCOUNTER — Encounter (HOSPITAL_BASED_OUTPATIENT_CLINIC_OR_DEPARTMENT_OTHER): Payer: Self-pay | Admitting: Physical Therapy

## 2021-10-25 ENCOUNTER — Encounter (HOSPITAL_BASED_OUTPATIENT_CLINIC_OR_DEPARTMENT_OTHER): Payer: Medicare HMO | Admitting: Physical Therapy

## 2021-10-26 ENCOUNTER — Encounter (HOSPITAL_BASED_OUTPATIENT_CLINIC_OR_DEPARTMENT_OTHER): Payer: Medicare HMO | Admitting: Physical Therapy

## 2021-10-30 ENCOUNTER — Encounter (HOSPITAL_BASED_OUTPATIENT_CLINIC_OR_DEPARTMENT_OTHER): Payer: Medicare HMO | Admitting: Physical Therapy

## 2021-11-01 ENCOUNTER — Encounter (HOSPITAL_BASED_OUTPATIENT_CLINIC_OR_DEPARTMENT_OTHER): Payer: Self-pay | Admitting: Physical Therapy

## 2021-11-01 ENCOUNTER — Other Ambulatory Visit: Payer: Self-pay

## 2021-11-01 ENCOUNTER — Ambulatory Visit (HOSPITAL_BASED_OUTPATIENT_CLINIC_OR_DEPARTMENT_OTHER): Payer: Medicare HMO | Admitting: Physical Therapy

## 2021-11-01 DIAGNOSIS — M6281 Muscle weakness (generalized): Secondary | ICD-10-CM

## 2021-11-01 DIAGNOSIS — G8929 Other chronic pain: Secondary | ICD-10-CM

## 2021-11-01 DIAGNOSIS — M25511 Pain in right shoulder: Secondary | ICD-10-CM | POA: Diagnosis not present

## 2021-11-01 DIAGNOSIS — M25611 Stiffness of right shoulder, not elsewhere classified: Secondary | ICD-10-CM

## 2021-11-01 NOTE — Therapy (Signed)
OUTPATIENT PHYSICAL THERAPY TREATMENT NOTE/PROGRESS NOTE   Patient Name: Logan Decker MRN: [id number] DOB:[birth year], 61 y.o., male Today's Date: 11/01/2021  PCP: Robert Bellow, PA-C      Past Medical History:  Diagnosis Date   Arthritis    "knees" (08/08/2017)   Brain aneurysm    ruptured brain aneurysm, treated surgically, was very sick, 01/17/2002, "almost died"   CKD (chronic kidney disease) stage 4, GFR 15-29 ml/min (HCC)    Gout    High cholesterol    Hypertension    Seizures ([place])    "because of scaring on brain tissue after OR for ruptured aneursym; on daily RX; last sz was in 07/2017" (08/08/2017)   Wears dentures    Past Surgical History:  Procedure Laterality Date   AMPUTATION Right 08/08/2017   Procedure: AMPUTATION right tumb;  Surgeon: Milly Jakob, MD;  Location: [facility];  Service: Orthopedics;  Laterality: Right;   AMPUTATION FINGER Right 08/08/2017   thumb   AV FISTULA PLACEMENT Left 03/23/2020   Procedure: LEFT ARM ARTERIOVENOUS (AV) FISTULA CREATION;  Surgeon: Waynetta Sandy, MD;  Location: [facility];  Service: Vascular;  Laterality: Left;   CEREBRAL ANEURYSM REPAIR  Jan 17, 2002   [employer]   COLONOSCOPY     FISTULA SUPERFICIALIZATION Left 05/30/2020   Procedure: LEFT BRACHIOCEPHALIC Transposition of FISTULA .;  Surgeon: Waynetta Sandy, MD;  Location: [facility];  Service: Vascular;  Laterality: Left;   IR PERC TUN PERIT CATH WO PORT S&I /IMAG  11/14/2020   IR US GUIDE VASC ACCESS RIGHT  11/14/2020   LAPAROSCOPIC NEPHRECTOMY Left 11/10/2020   Procedure: LEFT LAPAROSCOPIC RADICAL NEPHRECTOMY;  Surgeon: Ardis Hughs, MD;  [facility];  Service: Urology;  Laterality: Left;   MULTIPLE TOOTH EXTRACTIONS     ROBOTIC ASSITED PARTIAL NEPHRECTOMY Right 12/05/2017   Procedure: XI ROBOTIC ASSITED LAPAROSCOPIC RIGHT PARTIAL NEPHRECTOMY;  Surgeon: Ardis Hughs, MD;  [facility];  Service: Urology;  Laterality: Right;   VARICOSE VEIN SURGERY  Bilateral    legs   Patient Active Problem List   Diagnosis Date Noted   Hypotension 08/16/2021   Syncope 08/15/2021   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/14/2020   ESRD (end stage renal disease) ([place]) 11/13/2020   Dyslipidemia 11/13/2020   Left renal mass 11/10/2020   Polycystic kidney disease 04/06/2018   Renal insufficiency 04/06/2018   Renal mass 12/05/2017   CKD (chronic kidney disease) stage 3, GFR 30-59 ml/min (HCC) 08/11/2017   Acquired complex renal cyst 08/11/2017   Essential hypertension 08/11/2017   Seizure disorder ([place]) 08/11/2017   History of nontraumatic rupture of cerebral aneurysm 08/11/2017   Osteomyelitis ([place]) 08/08/2017    PCP: Robert Bellow, PA-C   REFERRING PROVIDER: Vanetta Mulders, MD   REFERRING DIAG: M25.511 (ICD-10-CM) - Acute pain of right shoulder   THERAPY DIAG:  Right shoulder pain/ Right Shoulder stiffness.      ONSET DATE: 10 /26   SUBJECTIVE:  SUBJECTIVE STATEMENT:   Pt is 11 weeks and 1 day s/p R proximal humerus fx.  Patient reports, he continues to use his arm more and more.  "I'm getting better".  Pt reports he had minimally increased pain after prior Rx.  Pt denies pain and states he warmed up his arm prior to Rx with walking.  Pt states he rode his bike yesterday and had a little bit of pain.  Pt states he can reach further with R UE.  Pt is able to dress, bathe, and shave.  He reports min to mod difficulty with dressing and bathing.  Pt is able to cut food and feed himself.  Pt states he couldn't even don his compression stockings prior but is able to now.  Pt is limited with reaching activities and overhead activities.  Pt states he has been doing some of his HEP; he has been doing the exercises with RTB.                                                              PERTINENT HISTORY: Seizures, brain aneurysm; knee  arthritis, CKD ( dialysis 3 days a week.    PAIN:  Are you having pain? No NPRS scale: 0/10 Pain location: anterior and lateral right shoulder  Pain orientation: Right  PAIN TYPE: aching Pain description: intermittent  Aggravating factors: use of the shoulder  Relieving factors: not using the shoulder    PRECAUTIONS: Fall and Other: Dialysis   history of seizures    WEIGHT BEARING RESTRICTIONS No   FALLS:  Has patient fallen in last 6 months? Yes Number of falls: 1 fell after becoming syncopal after dialysis       PLOF: Independent   PATIENT GOALS    To be able to use his right arm    OBJECTIVE:     X rays in Epic indicated:   Similar-appearing trace callus formation surrounding a comminuted right humeral head/neck fracture.     TODAY'S TREATMENT:   R Shoulder AROM/PROM: Flex:  135 (supine) and 110 deg (standing)/149 deg, Scaption:  105 deg Abd: NT /135, ER:  60/62 deg, IR:  45/49 deg    Therapeutic Exercise:   -Reviewed response to prior Rx, HEP compliance, pain level, and current function.     -Assessed AROM and PROM   -Pt received R shoulder PROM in flex, scaption, abduction, ER, and IR in supine per pt tol and tissue tolerance.    -Pt performed:             -supine flexion AROM x10 reps             -seated wand ER 2x10 reps             -seated pulleys in flexion and scaption -side lying ER no weight 2x10          Exercises Supine Shoulder External Rotation in 45 Degrees Abduction AAROM with Dowel - 2 x daily - 7 x weekly - 3 sets - 10 reps Supine Shoulder Press AAROM in Abduction with Dowel - 2 x daily - 7 x weekly - 3 sets - 10 reps Seated Shoulder Flexion Towel Slide at Table Top - 2 x daily - 7 x weekly - 3 sets - 10 reps Circular Shoulder Pendulum  with Table Support - 2 x daily - 7 x weekly - 3 sets - 10 reps Horizontal Shoulder Pendulum with Table Support - 2 x daily - 7 x weekly - 3 sets - 10 reps     PATIENT EDUCATION: Education details:   Exercise form, POC, and objective findings. Person educated: Patient Education method: Explanation, Demonstration, Tactile cues, Verbal cues Education comprehension: verbalized understanding, returned demonstration, verbal cues required, tactile cues required, and needs further education       ASSESSMENT:   CLINICAL IMPRESSION: Patient continues to make good progress.  He is improving with PROM and AAROM as evidenced by goniometric measurements.  He is improving with functional usage of R UE including performing self care activities/ADLs as evidenced by subjective reports.  Though he is is improving with ADLs, he continued to have difficulty and limitations.  He has expected deficits with reaching activities and overhead activities.  PT has progressed exercises and pt is tolerating progression well.  Pt is progressing well toward goals and should cont to benefit from cont skilled PT services to address ongoing goals and impairment and to assist with returning to PLOF.   Objective impairments include decreased activity tolerance, decreased endurance, decreased ROM, decreased strength, impaired flexibility, impaired UE functional use, and pain. These impairments are limiting patient from community activity, meal prep, laundry, and yard work. 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 Patient will benefit from skilled PT to address above impairments and improve overall function.   REHAB POTENTIAL: Good   CLINICAL DECISION MAKING: Evolving/moderate complexity significant loss of function of the arm    EVALUATION COMPLEXITY: Moderate     GOALS:     SHORT TERM GOALS:   STG Name Target Date Goal status  1 Patient will increase passive right shoulder flexion and ER by 20 degrees Baseline:  10/10/2021 Improved to 53 degrees met 1/4  2 Patient will increase active shoulder flexion to 40 degrees   Baseline:  10/10/2021 Improved to 90 degrees  Achieved  1/4   3 Patient will be independent with basic AAROM and PROM program  Baseline: 10/10/2021 Independent; patient reduced to 1x a week to work on [employer] 1/4  LONG TERM GOALS:    LTG Name Target Date Goal status  1 Patient will increase active right shoulder flexion to 90 degrees without pain in order to perform ADL's  Baseline: 10/31/2021 PARTIALLY MET  2 Patient will use his right arm to feed himself  Baseline: 10/31/2021 GOAL MET  3 Patient will reach behind his head without significant pain in order to perform ADL's  Baseline:  12/27/2021 INITIAL  4 Pt will be able to perform his ADLs and IADLs without significant pain or limitation. 12/27/2021 UPDATED GOAL   PLAN: PT FREQUENCY: 2x/week   PT DURATION: 6 weeks   PLANNED INTERVENTIONS: Therapeutic exercises, Therapeutic activity, Neuro Muscular re-education, Balance training, Gait training, Patient/Family education, Joint mobilization, Aquatic Therapy, Dry Needling, Cryotherapy, Moist heat, Taping, Ionotophoresis 49m/ml Dexamethasone, and Manual therapy   PLAN FOR NEXT SESSION: Continue with ther ex and ROM per healing constraints.  Progress HEP as able.      RSelinda MichaelsIII PT, DPT 11/02/21 4:00 PM

## 2021-11-02 ENCOUNTER — Encounter (HOSPITAL_BASED_OUTPATIENT_CLINIC_OR_DEPARTMENT_OTHER): Payer: Medicare HMO | Admitting: Physical Therapy

## 2021-11-06 ENCOUNTER — Encounter (HOSPITAL_BASED_OUTPATIENT_CLINIC_OR_DEPARTMENT_OTHER): Payer: Medicare HMO | Admitting: Physical Therapy

## 2021-11-08 ENCOUNTER — Other Ambulatory Visit: Payer: Self-pay

## 2021-11-08 ENCOUNTER — Ambulatory Visit (HOSPITAL_BASED_OUTPATIENT_CLINIC_OR_DEPARTMENT_OTHER): Payer: Medicare HMO | Admitting: Physical Therapy

## 2021-11-08 ENCOUNTER — Encounter (HOSPITAL_BASED_OUTPATIENT_CLINIC_OR_DEPARTMENT_OTHER): Payer: Self-pay | Admitting: Physical Therapy

## 2021-11-08 DIAGNOSIS — M25511 Pain in right shoulder: Secondary | ICD-10-CM | POA: Diagnosis not present

## 2021-11-08 DIAGNOSIS — M6281 Muscle weakness (generalized): Secondary | ICD-10-CM

## 2021-11-08 DIAGNOSIS — M25611 Stiffness of right shoulder, not elsewhere classified: Secondary | ICD-10-CM

## 2021-11-08 DIAGNOSIS — G8929 Other chronic pain: Secondary | ICD-10-CM

## 2021-11-08 NOTE — Therapy (Signed)
OUTPATIENT PHYSICAL THERAPY TREATMENT NOTE/PROGRESS NOTE   Patient Name: Logan Decker MRN: [id number] DOB:December 19, 1960, 61 y.o., male Today's Date: 11/09/2021  PCP: Robert Bellow, PA-C    PT End of Session - 11/08/21 2354/01/01     Visit Number 7    Number of Visits 12    Date for PT Re-Evaluation 12/13/21    Authorization Type [employer]    PT Start Time 0355    PT Stop Time 1521-01-01    PT Time Calculation (min) 40 min    Activity Tolerance Patient tolerated treatment well    Behavior During Therapy [facility] for tasks assessed/performed              Past Medical History:  Diagnosis Date   Arthritis    "knees" (08/08/2017)   Brain aneurysm    ruptured brain aneurysm, treated surgically, was very sick, Jan 01, 2002, "almost died"   CKD (chronic kidney disease) stage 4, GFR 15-29 ml/min (HCC)    Gout    High cholesterol    Hypertension    Seizures ([place])    "because of scaring on brain tissue after OR for ruptured aneursym; on daily RX; last sz was in 07/2017" (08/08/2017)   Wears dentures    Past Surgical History:  Procedure Laterality Date   AMPUTATION Right 08/08/2017   Procedure: AMPUTATION right tumb;  Surgeon: Milly Jakob, MD;  Location: [place];  Service: Orthopedics;  Laterality: Right;   AMPUTATION FINGER Right 08/08/2017   thumb   AV FISTULA PLACEMENT Left 03/23/2020   Procedure: LEFT ARM ARTERIOVENOUS (AV) FISTULA CREATION;  Surgeon: Waynetta Sandy, MD;  Location: [place];  Service: Vascular;  Laterality: Left;   CEREBRAL ANEURYSM REPAIR  2002/01/01   [employer]   COLONOSCOPY     FISTULA SUPERFICIALIZATION Left 05/30/2020   Procedure: LEFT BRACHIOCEPHALIC Transposition of FISTULA .;  Surgeon: Waynetta Sandy, MD;  Location: [place];  Service: Vascular;  Laterality: Left;   IR PERC TUN PERIT CATH WO PORT S&I /IMAG  11/14/2020   IR US GUIDE VASC ACCESS RIGHT  11/14/2020   LAPAROSCOPIC NEPHRECTOMY Left 11/10/2020   Procedure: LEFT LAPAROSCOPIC RADICAL NEPHRECTOMY;  Surgeon:  Ardis Hughs, MD;  [facility]: [facility];  Service: Urology;  Laterality: Left;   MULTIPLE TOOTH EXTRACTIONS     ROBOTIC ASSITED PARTIAL NEPHRECTOMY Right 12/05/2017   Procedure: XI ROBOTIC ASSITED LAPAROSCOPIC RIGHT PARTIAL NEPHRECTOMY;  Surgeon: Ardis Hughs, MD;  Location: [facility];  Service: Urology;  Laterality: Right;   VARICOSE VEIN SURGERY Bilateral    legs   Patient Active Problem List   Diagnosis Date Noted   Hypotension 08/16/2021   Syncope 08/15/2021   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[place]) 11/14/2020   ESRD (end stage renal disease) ([place]) 11/13/2020   Dyslipidemia 11/13/2020   Left renal mass 11/10/2020   Polycystic kidney disease 04/06/2018   Renal insufficiency 04/06/2018   Renal mass 12/05/2017   CKD (chronic kidney disease) stage 3, GFR 30-59 ml/min (HCC) 08/11/2017   Acquired complex renal cyst 08/11/2017   Essential hypertension 08/11/2017   Seizure disorder ([place]) 08/11/2017   History of nontraumatic rupture of cerebral aneurysm 08/11/2017   Osteomyelitis ([place]) 08/08/2017    PCP: Robert Bellow, PA-C   REFERRING PROVIDER: Vanetta Mulders, MD   REFERRING DIAG: M25.511 (ICD-10-CM) - Acute pain of right shoulder   THERAPY DIAG:  Right shoulder pain/ Right Shoulder stiffness.      ONSET DATE: 10 /26   SUBJECTIVE:  SUBJECTIVE STATEMENT:   Pt is 12 weeks and 1 day s/p R proximal humerus fx.  Patient reports, he continues to use his arm more and more.  "I'm getting better".  Pt states he rode his bike the day before yesterday without pain.  Pt states he can reach further with R UE.  Pt is able to dress, bathe, and shave.  Pt is able to cut food and feed himself.  Pt states he couldn't even don his compression stockings prior but is able to now.  Pt is limited with reaching activities and overhead activities.  Pt states he has been doing some of his HEP; he  has been doing the exercises with RTB.    Pt states he felt good after prior Rx.  Pt states he is able to get off the floor easier.  Pt reports improved lifting.                                                              PERTINENT HISTORY: Seizures, brain aneurysm; knee arthritis, CKD ( dialysis 3 days a week.    PAIN:  Are you having pain? Yes NPRS scale: 2/10 Pain location: anterior and posterior Right shoulder  Pain orientation: Right  PAIN TYPE: aching Pain description: intermittent  Aggravating factors: use of the shoulder  Relieving factors: not using the shoulder    PRECAUTIONS: Fall and Other: Dialysis   history of seizures    WEIGHT BEARING RESTRICTIONS No   FALLS:  Has patient fallen in last 6 months? Yes Number of falls: 1 fell after becoming syncopal after dialysis       PLOF: Independent   PATIENT GOALS    To be able to use his right arm    OBJECTIVE:     X rays in Epic indicated:   Similar-appearing trace callus formation surrounding a comminuted right humeral head/neck fracture.     TODAY'S TREATMENT:  Therapeutic Exercise:   -Reviewed response to prior Rx, HEP compliance, pain level, and current function.     -Pt received R shoulder PROM in flex, scaption, abduction, ER, and IR in supine per pt tol and tissue tolerance.    -Pt performed:             -supine flexion AROM 2x10 reps             -supine serratus punch 2x10 reps  -supine shoulder ABC x 1 rep             -seated wand ER 2x10 reps             -seated pulleys in flexion and scaption x 20 reps each -side lying ER AROM 2x10  -Bent over Row 2x10 reps   -standing wall walks in flexion AAROM x 8 reps       Exercises Supine Shoulder External Rotation in 45 Degrees Abduction AAROM with Dowel - 2 x daily - 7 x weekly - 3 sets - 10 reps Supine Shoulder Press AAROM in Abduction with Dowel - 2 x daily - 7 x weekly - 3 sets - 10 reps Seated Shoulder Flexion Towel Slide at Table Top - 2 x daily -  7 x weekly - 3 sets - 10 reps Circular Shoulder Pendulum with Table Support - 2 x daily -  7 x weekly - 3 sets - 10 reps Horizontal Shoulder Pendulum with Table Support - 2 x daily - 7 x weekly - 3 sets - 10 reps     PATIENT EDUCATION: Education details:  Exercise form, POC, and objective findings. Person educated: Patient Education method: Explanation, Demonstration, Tactile cues, Verbal cues Education comprehension: verbalized understanding, returned demonstration, verbal cues required, tactile cues required, and needs further education       ASSESSMENT:   CLINICAL IMPRESSION: Pt is improving functionally as evidenced by subjective reports.  Pt continues to be limited with functional usage of R UE including with self care activities and reaching activities.  Pt requires cuing and instruction in correct form with exercises.  He keeps his elbow bent in many of the exercises and requires multi-modal cuing to straighten elbow.  Pt reports shoulder popping with shoulder ABC and supine serratus punch though not painful.    Pt was fatigued with exercises today.  He reports his arm feels heavy and had increased pain to 5-6/10 after rx.  Pt should cont to benefit from cont skilled PT services to address ongoing goals and impairment and to assist with returning to PLOF.   Objective impairments include decreased activity tolerance, decreased endurance, decreased ROM, decreased strength, impaired flexibility, impaired UE functional use, and pain. These impairments are limiting patient from community activity, meal prep, laundry, and yard work. 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 Patient will benefit from skilled PT to address above impairments and improve overall function.   REHAB POTENTIAL: Good   CLINICAL DECISION MAKING: Evolving/moderate complexity significant loss of function of the arm     EVALUATION COMPLEXITY: Moderate     GOALS:     SHORT TERM GOALS:   STG Name Target Date Goal status  1 Patient will increase passive right shoulder flexion and ER by 20 degrees Baseline:  10/10/2021 Improved to 53 degrees met 1/4  2 Patient will increase active shoulder flexion to 40 degrees  Baseline:  10/10/2021 Improved to 90 degrees  Achieved  1/4   3 Patient will be independent with basic AAROM and PROM program  Baseline: 10/10/2021 Independent; patient reduced to 1x a week to work on [employer] 1/4  LONG TERM GOALS:    LTG Name Target Date Goal status  1 Patient will increase active right shoulder flexion to 90 degrees without pain in order to perform ADL's  Baseline: 10/31/2021 PARTIALLY MET  2 Patient will use his right arm to feed himself  Baseline: 10/31/2021 GOAL MET  3 Patient will reach behind his head without significant pain in order to perform ADL's  Baseline:  12/27/2021 INITIAL  4 Pt will be able to perform his ADLs and IADLs without significant pain or limitation. 12/27/2021 UPDATED GOAL   PLAN: PT FREQUENCY: 2x/week   PT DURATION: 6 weeks   PLANNED INTERVENTIONS: Therapeutic exercises, Therapeutic activity, Neuro Muscular re-education, Balance training, Gait training, Patient/Family education, Joint mobilization, Aquatic Therapy, Dry Needling, Cryotherapy, Moist heat, Taping, Ionotophoresis 39m/ml Dexamethasone, and Manual therapy   PLAN FOR NEXT SESSION: Continue with ther ex and ROM per healing constraints.  Progress HEP as able.      RSelinda MichaelsIII PT, DPT 11/09/21 12:02 AM

## 2021-11-09 ENCOUNTER — Encounter (HOSPITAL_BASED_OUTPATIENT_CLINIC_OR_DEPARTMENT_OTHER): Payer: Medicare HMO | Admitting: Physical Therapy

## 2021-11-13 ENCOUNTER — Encounter (HOSPITAL_BASED_OUTPATIENT_CLINIC_OR_DEPARTMENT_OTHER): Payer: Medicare HMO | Admitting: Physical Therapy

## 2021-11-15 ENCOUNTER — Encounter (HOSPITAL_BASED_OUTPATIENT_CLINIC_OR_DEPARTMENT_OTHER): Payer: Self-pay

## 2021-11-15 ENCOUNTER — Ambulatory Visit (HOSPITAL_BASED_OUTPATIENT_CLINIC_OR_DEPARTMENT_OTHER): Payer: Medicare HMO | Admitting: Physical Therapy

## 2021-11-16 ENCOUNTER — Encounter (HOSPITAL_BASED_OUTPATIENT_CLINIC_OR_DEPARTMENT_OTHER): Payer: Medicare HMO | Admitting: Physical Therapy

## 2021-11-20 ENCOUNTER — Encounter (HOSPITAL_BASED_OUTPATIENT_CLINIC_OR_DEPARTMENT_OTHER): Payer: Medicare HMO | Admitting: Physical Therapy

## 2021-11-22 ENCOUNTER — Ambulatory Visit (HOSPITAL_BASED_OUTPATIENT_CLINIC_OR_DEPARTMENT_OTHER): Payer: Medicare HMO | Attending: Orthopaedic Surgery | Admitting: Physical Therapy

## 2021-11-22 ENCOUNTER — Other Ambulatory Visit: Payer: Self-pay

## 2021-11-22 DIAGNOSIS — M6281 Muscle weakness (generalized): Secondary | ICD-10-CM | POA: Diagnosis present

## 2021-11-22 DIAGNOSIS — M25511 Pain in right shoulder: Secondary | ICD-10-CM | POA: Diagnosis present

## 2021-11-22 DIAGNOSIS — G8929 Other chronic pain: Secondary | ICD-10-CM | POA: Diagnosis present

## 2021-11-22 DIAGNOSIS — M25611 Stiffness of right shoulder, not elsewhere classified: Secondary | ICD-10-CM | POA: Insufficient documentation

## 2021-11-22 NOTE — Therapy (Signed)
OUTPATIENT PHYSICAL THERAPY TREATMENT NOTE   Patient Name: Logan Decker MRN: [id number] DOB:05-Feb-1961, 61 y.o., male Today's Date: 11/23/2021  PCP: Robert Bellow, PA-C    PT End of Session - 11/22/21 1449     Visit Number 8    Number of Visits 12    Date for PT Re-Evaluation 12/13/21    Authorization Type [employer]    PT Start Time 0272    PT Stop Time 11/30/1517    PT Time Calculation (min) 38 min    Activity Tolerance Patient tolerated treatment well    Behavior During Therapy [facility] for tasks assessed/performed               Past Medical History:  Diagnosis Date   Arthritis    "knees" (08/08/2017)   Brain aneurysm    ruptured brain aneurysm, treated surgically, was very sick, 30-Nov-2001, "almost died"   CKD (chronic kidney disease) stage 4, GFR 15-29 ml/min (HCC)    Gout    High cholesterol    Hypertension    Seizures ([place])    "because of scaring on brain tissue after OR for ruptured aneursym; on daily RX; last sz was in 07/2017" (08/08/2017)   Wears dentures    Past Surgical History:  Procedure Laterality Date   AMPUTATION Right 08/08/2017   Procedure: AMPUTATION right tumb;  Surgeon: Milly Jakob, MD;  Location: [place];  Service: Orthopedics;  Laterality: Right;   AMPUTATION FINGER Right 08/08/2017   thumb   AV FISTULA PLACEMENT Left 03/23/2020   Procedure: LEFT ARM ARTERIOVENOUS (AV) FISTULA CREATION;  Surgeon: Waynetta Sandy, MD;  Location: [place];  Service: Vascular;  Laterality: Left;   CEREBRAL ANEURYSM REPAIR  11-30-01   [employer]   COLONOSCOPY     FISTULA SUPERFICIALIZATION Left 05/30/2020   Procedure: LEFT BRACHIOCEPHALIC Transposition of FISTULA .;  Surgeon: Waynetta Sandy, MD;  Location: [place];  Service: Vascular;  Laterality: Left;   IR PERC TUN PERIT CATH WO PORT S&I /IMAG  11/14/2020   IR US GUIDE VASC ACCESS RIGHT  11/14/2020   LAPAROSCOPIC NEPHRECTOMY Left 11/10/2020   Procedure: LEFT LAPAROSCOPIC RADICAL NEPHRECTOMY;  Surgeon: Ardis Hughs, MD;  [facility];  Service: Urology;  Laterality: Left;   MULTIPLE TOOTH EXTRACTIONS     ROBOTIC ASSITED PARTIAL NEPHRECTOMY Right 12/05/2017   Procedure: XI ROBOTIC ASSITED LAPAROSCOPIC RIGHT PARTIAL NEPHRECTOMY;  Surgeon: Ardis Hughs, MD;  [facility];  Service: Urology;  Laterality: Right;   VARICOSE VEIN SURGERY Bilateral    legs   Patient Active Problem List   Diagnosis Date Noted   Hypotension 08/16/2021   Syncope 08/15/2021   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[place]) 11/14/2020   ESRD (end stage renal disease) ([place]) 11/13/2020   Dyslipidemia 11/13/2020   Left renal mass 11/10/2020   Polycystic kidney disease 04/06/2018   Renal insufficiency 04/06/2018   Renal mass 12/05/2017   CKD (chronic kidney disease) stage 3, GFR 30-59 ml/min (HCC) 08/11/2017   Acquired complex renal cyst 08/11/2017   Essential hypertension 08/11/2017   Seizure disorder ([place]) 08/11/2017   History of nontraumatic rupture of cerebral aneurysm 08/11/2017   Osteomyelitis ([place]) 08/08/2017    PCP: Robert Bellow, PA-C   REFERRING PROVIDER: Vanetta Mulders, MD   REFERRING DIAG: M25.511 (ICD-10-CM) - Acute pain of right shoulder   THERAPY DIAG:  Right shoulder pain/ Right Shoulder stiffness.      ONSET DATE: 10 /26   SUBJECTIVE:  SUBJECTIVE STATEMENT:   Pt is 14 weeks and 1 day s/p R proximal humerus fx.  Patient states he is improving.  He has been riding his bike some.  Pt reports improved reaching overhead.  He is able to reach into an overhead cabinet to grab a cup or plate.  Pt reports improved flexibility.  Pt states he is able to get off the floor easier.  Pt reports improved lifting.  Pt is able to dress, bathe, and shave difficulty.  Pt denies any adverse effects after prior Rx.  Pt states he hasn't been performing his HEP as much.                                                              PERTINENT HISTORY: Seizures, brain aneurysm; knee arthritis, CKD ( dialysis 3 days a week.    PAIN:  Are you having pain? Yes NPRS scale: 3-4/10 Pain location: anterior and superior Right shoulder  Pain orientation: Right  PAIN TYPE: aching Pain description: intermittent  Aggravating factors: use of the shoulder  Relieving factors: not using the shoulder    PRECAUTIONS: Fall and Other: Dialysis   history of seizures    WEIGHT BEARING RESTRICTIONS No   FALLS:  Has patient fallen in last 6 months? Yes Number of falls: 1 fell after becoming syncopal after dialysis       PLOF: Independent   PATIENT GOALS    To be able to use his right arm    OBJECTIVE:     X rays in Epic indicated:   Similar-appearing trace callus formation surrounding a comminuted right humeral head/neck fracture.     TODAY'S TREATMENT:  Therapeutic Exercise:   -Reviewed response to prior Rx, HEP compliance, pain level, and current function.     -Pt received R shoulder PROM in flex, scaption, abduction, ER, and IR in supine per pt tol and tissue tolerance.    -Pt performed:             -supine flexion AROM 2x10 reps             -supine serratus punch 2x10 reps  -supine shoulder ABC x 1 rep             -seated wand ER 2x10 reps             -seated pulleys in flexion and scaption x 20 reps each -side lying ER AROM 2x10  -Bent over Row 2x10 reps   -standing flexion L UE approx 12-15 reps -Jobe's flexion x 10 reps -Attempted standing scaption AROM and AAROM with wand  -Scap retraction: 2x10 reps red  -shoulder extension 2x10 red  -standing wall walks in flexion x 10 reps     Exercises Supine Shoulder External Rotation in 45 Degrees Abduction AAROM with Dowel - 2 x daily - 7 x weekly - 3 sets - 10 reps Supine Shoulder Press AAROM in Abduction with Dowel - 2 x daily - 7 x weekly - 3 sets - 10 reps Seated Shoulder Flexion Towel Slide at Table Top - 2 x daily - 7 x weekly - 3 sets -  10 reps Circular Shoulder Pendulum with Table Support - 2 x daily - 7 x weekly - 3 sets - 10 reps Horizontal Shoulder Pendulum with Table  Support - 2 x daily - 7 x weekly - 3 sets - 10 reps     PATIENT EDUCATION: Education details:  Exercise form, POC, and objective findings. Person educated: Patient Education method: Explanation, Demonstration, Tactile cues, Verbal cues Education comprehension: verbalized understanding, returned demonstration, verbal cues required, tactile cues required, and needs further education       ASSESSMENT:   CLINICAL IMPRESSION: Pt is making good progress with function as evidenced by reports of being able to bathe, dress, and shave without difficulty.    Pt able to perform standing flexion AROM but unable to perform standing scaption ROM and AAROM correctly.  Pt requires cuing and instruction in correct form with exercises including to straighten elbow when performing standing flexion.  He is improving with shoulder AROM/AAROM.  Pt responded well to Rx having no increased pain after Rx.   Pt should cont to benefit from cont skilled PT services to address ongoing goals and impairment and to assist with returning to PLOF.     Objective impairments include decreased activity tolerance, decreased endurance, decreased ROM, decreased strength, impaired flexibility, impaired UE functional use, and pain. These impairments are limiting patient from community activity, meal prep, laundry, and yard work. 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 Patient will benefit from skilled PT to address above impairments and improve overall function.   REHAB POTENTIAL: Good   CLINICAL DECISION MAKING: Evolving/moderate complexity significant loss of function of the arm    EVALUATION COMPLEXITY: Moderate     GOALS:     SHORT TERM GOALS:   STG Name Target Date Goal status  1  Patient will increase passive right shoulder flexion and ER by 20 degrees Baseline:  10/10/2021 Improved to 53 degrees met 1/4  2 Patient will increase active shoulder flexion to 40 degrees  Baseline:  10/10/2021 Improved to 90 degrees  Achieved  1/4   3 Patient will be independent with basic AAROM and PROM program  Baseline: 10/10/2021 Independent; patient reduced to 1x a week to work on [employer] 1/4  LONG TERM GOALS:    LTG Name Target Date Goal status  1 Patient will increase active right shoulder flexion to 90 degrees without pain in order to perform ADL's  Baseline: 10/31/2021 PARTIALLY MET  2 Patient will use his right arm to feed himself  Baseline: 10/31/2021 GOAL MET  3 Patient will reach behind his head without significant pain in order to perform ADL's  Baseline:  12/27/2021 INITIAL  4 Pt will be able to perform his ADLs and IADLs without significant pain or limitation. 12/27/2021 UPDATED GOAL   PLAN: PT FREQUENCY: 2x/week   PT DURATION: 6 weeks   PLANNED INTERVENTIONS: Therapeutic exercises, Therapeutic activity, Neuro Muscular re-education, Balance training, Gait training, Patient/Family education, Joint mobilization, Aquatic Therapy, Dry Needling, Cryotherapy, Moist heat, Taping, Ionotophoresis 5m/ml Dexamethasone, and Manual therapy   PLAN FOR NEXT SESSION: Continue with ther ex and ROM per healing constraints.  Progress HEP as able.      RSelinda MichaelsIII PT, DPT 11/23/21 3:10 PM

## 2021-11-23 ENCOUNTER — Encounter (HOSPITAL_BASED_OUTPATIENT_CLINIC_OR_DEPARTMENT_OTHER): Payer: Medicare HMO | Admitting: Physical Therapy

## 2021-11-23 ENCOUNTER — Encounter (HOSPITAL_BASED_OUTPATIENT_CLINIC_OR_DEPARTMENT_OTHER): Payer: Self-pay | Admitting: Physical Therapy

## 2021-11-29 ENCOUNTER — Ambulatory Visit (HOSPITAL_BASED_OUTPATIENT_CLINIC_OR_DEPARTMENT_OTHER): Payer: Medicare HMO | Admitting: Physical Therapy

## 2021-11-29 ENCOUNTER — Encounter (HOSPITAL_BASED_OUTPATIENT_CLINIC_OR_DEPARTMENT_OTHER): Payer: Self-pay | Admitting: Physical Therapy

## 2021-11-29 ENCOUNTER — Other Ambulatory Visit: Payer: Self-pay

## 2021-11-29 DIAGNOSIS — M25611 Stiffness of right shoulder, not elsewhere classified: Secondary | ICD-10-CM

## 2021-11-29 DIAGNOSIS — M25511 Pain in right shoulder: Secondary | ICD-10-CM | POA: Diagnosis not present

## 2021-11-29 DIAGNOSIS — M6281 Muscle weakness (generalized): Secondary | ICD-10-CM

## 2021-11-29 DIAGNOSIS — G8929 Other chronic pain: Secondary | ICD-10-CM

## 2021-11-29 NOTE — Therapy (Signed)
OUTPATIENT PHYSICAL THERAPY TREATMENT NOTE   Patient Name: Logan Decker MRN: [id number] DOB:Nov 28, 1960, 61 y.o., male Today's Date: 11/29/2021  PCP: Robert Bellow, PA-C    PT End of Session - 11/29/21 1523     Visit Number 9    Number of Visits 12    Date for PT Re-Evaluation 12/13/21    Authorization Type [employer]    PT Start Time 6333    PT Stop Time 01/19/18    PT Time Calculation (min) 34 min    Activity Tolerance Patient tolerated treatment well    Behavior During Therapy WFL for tasks assessed/performed                Past Medical History:  Diagnosis Date   Arthritis    "knees" (08/08/2017)   Brain aneurysm    ruptured brain aneurysm, treated surgically, was very sick, 01/19/2002, "almost died"   CKD (chronic kidney disease) stage 4, GFR 15-29 ml/min (HCC)    Gout    High cholesterol    Hypertension    Seizures ([place])    "because of scaring on brain tissue after OR for ruptured aneursym; on daily RX; last sz was in 07/2017" (08/08/2017)   Wears dentures    Past Surgical History:  Procedure Laterality Date   AMPUTATION Right 08/08/2017   Procedure: AMPUTATION right tumb;  Surgeon: Milly Jakob, MD;  Location: [facility];  Service: Orthopedics;  Laterality: Right;   AMPUTATION FINGER Right 08/08/2017   thumb   AV FISTULA PLACEMENT Left 03/23/2020   Procedure: LEFT ARM ARTERIOVENOUS (AV) FISTULA CREATION;  Surgeon: Waynetta Sandy, MD;  Location: [facility];  Service: Vascular;  Laterality: Left;   CEREBRAL ANEURYSM REPAIR  01-19-2002   [employer]   COLONOSCOPY     FISTULA SUPERFICIALIZATION Left 05/30/2020   Procedure: LEFT BRACHIOCEPHALIC Transposition of FISTULA .;  Surgeon: Waynetta Sandy, MD;  Location: [facility];  Service: Vascular;  Laterality: Left;   IR PERC TUN PERIT CATH WO PORT S&I /IMAG  11/14/2020   IR US GUIDE VASC ACCESS RIGHT  11/14/2020   LAPAROSCOPIC NEPHRECTOMY Left 11/10/2020   Procedure: LEFT LAPAROSCOPIC RADICAL NEPHRECTOMY;  Surgeon: Ardis Hughs, MD;  Location[facility];  Service: Urology;  Laterality: Left;   MULTIPLE TOOTH EXTRACTIONS     ROBOTIC ASSITED PARTIAL NEPHRECTOMY Right 12/05/2017   Procedure: XI ROBOTIC ASSITED LAPAROSCOPIC RIGHT PARTIAL NEPHRECTOMY;  Surgeon: Ardis Hughs, MD;  Location: [facility];  Service: Urology;  Laterality: Right;   VARICOSE VEIN SURGERY Bilateral    legs   Patient Active Problem List   Diagnosis Date Noted   Hypotension 08/16/2021   Syncope 08/15/2021   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[place]) 11/14/2020   ESRD (end stage renal disease) ([place]) 11/13/2020   Dyslipidemia 11/13/2020   Left renal mass 11/10/2020   Polycystic kidney disease 04/06/2018   Renal insufficiency 04/06/2018   Renal mass 12/05/2017   CKD (chronic kidney disease) stage 3, GFR 30-59 ml/min (HCC) 08/11/2017   Acquired complex renal cyst 08/11/2017   Essential hypertension 08/11/2017   Seizure disorder ([place]) 08/11/2017   History of nontraumatic rupture of cerebral aneurysm 08/11/2017   Osteomyelitis ([place]) 08/08/2017    PCP: Robert Bellow, PA-C   REFERRING PROVIDER: Vanetta Mulders, MD   REFERRING DIAG: M25.511 (ICD-10-CM) - Acute pain of right shoulder   THERAPY DIAG:  Right shoulder pain/ Right Shoulder stiffness.      ONSET DATE: 10 /26   SUBJECTIVE:  SUBJECTIVE STATEMENT:   Pt is 15 weeks and 1 day s/p R proximal humerus fx.  Pt states his shoulder is doing very well.  Pt states he hasn't been having any pain.  Pt denies any adverse effects after prior Rx.   Pt states he is making good improvement.  Pt reports improved shoulder mobility.  He has been riding his bike some.  Pt reports improved reaching overhead.  He is able to reach into an overhead cabinet to grab a cup or plate.  Pt states he is able to get off the floor easier.  Pt reports improved lifting.  Pt is able to dress, bathe, and shave  difficulty.  Pt denies any adverse effects after prior Rx.  Pt states he hasn't been performing his HEP as much.        Pt states sorry he was late to Rx today, his ride picked him up late.                                                              PERTINENT HISTORY: Seizures, brain aneurysm; knee arthritis, CKD ( dialysis 3 days a week.    PAIN:  Are you having pain? Yes NPRS scale: 3-4/10 Pain location: anterior and superior Right shoulder  Pain orientation: Right  PAIN TYPE: aching Pain description: intermittent  Aggravating factors: use of the shoulder  Relieving factors: not using the shoulder    PRECAUTIONS: Fall and Other: Dialysis   history of seizures    WEIGHT BEARING RESTRICTIONS No   FALLS:  Has patient fallen in last 6 months? Yes Number of falls: 1 fell after becoming syncopal after dialysis       PLOF: Independent   PATIENT GOALS    To be able to use his right arm    OBJECTIVE:     X rays in Epic indicated:   Similar-appearing trace callus formation surrounding a comminuted right humeral head/neck fracture.     TODAY'S TREATMENT:  Therapeutic Exercise:   -Reviewed response to prior Rx, HEP compliance, pain level, and current function.     -Pt received R shoulder PROM in flex, scaption, abduction, ER, and IR in supine per pt tol and tissue tolerance.    -Pt performed:             -supine serratus punch 2x10 reps  -supine shoulder ABC x 1 rep             -seated pulleys in flexion and scaption x 20 reps each -side lying ER AROM 2x10  -Bent over Row 2x10 reps -Jobe's flexion 2 x 10 reps -standing scaption AROM approx 12 reps  -Scap retraction: 2x10 reps red  -shoulder extension 2x10 red  -Shelf reaches 2x10 reps--Pt reached the top shelf.     Exercises Supine Shoulder External Rotation in 45 Degrees Abduction AAROM with Dowel - 2 x daily - 7 x weekly - 3 sets - 10 reps Supine Shoulder Press AAROM in Abduction with Dowel - 2 x daily - 7 x weekly -  3 sets - 10 reps Seated Shoulder Flexion Towel Slide at Table Top - 2 x daily - 7 x weekly - 3 sets - 10 reps Circular Shoulder Pendulum with Table Support - 2 x daily - 7 x weekly -  3 sets - 10 reps Horizontal Shoulder Pendulum with Table Support - 2 x daily - 7 x weekly - 3 sets - 10 reps     PATIENT EDUCATION: Education details:  Exercise form, POC, and objective findings. Person educated: Patient Education method: Explanation, Demonstration, Tactile cues, Verbal cues Education comprehension: verbalized understanding, returned demonstration, verbal cues required, tactile cues required, and needs further education       ASSESSMENT:   CLINICAL IMPRESSION: Pt was late to Rx today due to his transportation.  Pt is making good progress with function as evidenced by subjective reports.  He states his shoulder is doing very well.  Pt demonstrates improved functional reaching being able to reach the top shelf in the cabinet.  Pt demonstrates improved ability and form with standing scaption AROM though still requires cuing for correct form.  Pt requires cuing and instruction for correct form with exercises including to straighten elbow with certain exercises and also to slow down and control exercises.  He has improved tightness with PROM and improved PROM based upon visual observation.  Pt responded well to Rx having no pain after Rx just some tightness.   Pt should cont to benefit from cont skilled PT services to address ongoing goals and impairment and to assist with returning to PLOF.      Objective impairments include decreased activity tolerance, decreased endurance, decreased ROM, decreased strength, impaired flexibility, impaired UE functional use, and pain. These impairments are limiting patient from community activity, meal prep, laundry, and yard work. 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 Patient will benefit from skilled PT to address above impairments and improve overall function.   REHAB POTENTIAL: Good   CLINICAL DECISION MAKING: Evolving/moderate complexity significant loss of function of the arm    EVALUATION COMPLEXITY: Moderate     GOALS:     SHORT TERM GOALS:   STG Name Target Date Goal status  1 Patient will increase passive right shoulder flexion and ER by 20 degrees Baseline:  10/10/2021 Improved to 53 degrees met 1/4  2 Patient will increase active shoulder flexion to 40 degrees  Baseline:  10/10/2021 Improved to 90 degrees  Achieved  1/4   3 Patient will be independent with basic AAROM and PROM program  Baseline: 10/10/2021 Independent; patient reduced to 1x a week to work on [employer] 1/4  LONG TERM GOALS:    LTG Name Target Date Goal status  1 Patient will increase active right shoulder flexion to 90 degrees without pain in order to perform ADL's  Baseline: 10/31/2021 PARTIALLY MET  2 Patient will use his right arm to feed himself  Baseline: 10/31/2021 GOAL MET  3 Patient will reach behind his head without significant pain in order to perform ADL's  Baseline:  12/27/2021 INITIAL  4 Pt will be able to perform his ADLs and IADLs without significant pain or limitation. 12/27/2021 UPDATED GOAL   PLAN: PT FREQUENCY: 2x/week   PT DURATION: 6 weeks   PLANNED INTERVENTIONS: Therapeutic exercises, Therapeutic activity, Neuro Muscular re-education, Balance training, Gait training, Patient/Family education, Joint mobilization, Aquatic Therapy, Dry Needling, Cryotherapy, Moist heat, Taping, Ionotophoresis 48m/ml Dexamethasone, and Manual therapy   PLAN FOR NEXT SESSION: Continue with ther ex and ROM per healing constraints.  Progress HEP as able.  Add rhythmic stab's next visit.       RSelinda MichaelsIII PT, DPT 11/29/21 6:47 PM

## 2021-12-04 ENCOUNTER — Ambulatory Visit (HOSPITAL_BASED_OUTPATIENT_CLINIC_OR_DEPARTMENT_OTHER): Payer: Medicare HMO | Admitting: Physical Therapy

## 2021-12-04 ENCOUNTER — Encounter (HOSPITAL_BASED_OUTPATIENT_CLINIC_OR_DEPARTMENT_OTHER): Payer: Self-pay | Admitting: Physical Therapy

## 2021-12-04 ENCOUNTER — Other Ambulatory Visit: Payer: Self-pay

## 2021-12-04 DIAGNOSIS — M6281 Muscle weakness (generalized): Secondary | ICD-10-CM

## 2021-12-04 DIAGNOSIS — M25611 Stiffness of right shoulder, not elsewhere classified: Secondary | ICD-10-CM

## 2021-12-04 DIAGNOSIS — G8929 Other chronic pain: Secondary | ICD-10-CM

## 2021-12-04 DIAGNOSIS — M25511 Pain in right shoulder: Secondary | ICD-10-CM

## 2021-12-04 NOTE — Therapy (Signed)
OUTPATIENT PHYSICAL THERAPY TREATMENT NOTE   Patient Name: Logan Decker MRN: [id number] DOB:1961-07-05, 61 y.o., male Today's Date: 12/04/2021  PCP: Robert Bellow, PA-C    PT End of Session - 12/04/21 0834     Visit Number 10    Number of Visits 12    Date for PT Re-Evaluation 12/13/21    Authorization Type [employer]    PT Start Time 0805    PT Stop Time 0849    PT Time Calculation (min) 44 min    Activity Tolerance Patient tolerated treatment well    Behavior During Therapy [facility] for tasks assessed/performed                 Past Medical History:  Diagnosis Date   Arthritis    "knees" (08/08/2017)   Brain aneurysm    ruptured brain aneurysm, treated surgically, was very sick, 12-14-01, "almost died"   CKD (chronic kidney disease) stage 4, GFR 15-29 ml/min (HCC)    Gout    High cholesterol    Hypertension    Seizures ([place])    "because of scaring on brain tissue after OR for ruptured aneursym; on daily RX; last sz was in 07/2017" (08/08/2017)   Wears dentures    Past Surgical History:  Procedure Laterality Date   AMPUTATION Right 08/08/2017   Procedure: AMPUTATION right tumb;  Surgeon: Milly Jakob, MD;  Location: [facility];  Service: Orthopedics;  Laterality: Right;   AMPUTATION FINGER Right 08/08/2017   thumb   AV FISTULA PLACEMENT Left 03/23/2020   Procedure: LEFT ARM ARTERIOVENOUS (AV) FISTULA CREATION;  Surgeon: Waynetta Sandy, MD;  Location: [facility];  Service: Vascular;  Laterality: Left;   CEREBRAL ANEURYSM REPAIR  2001/12/14   [employer]   COLONOSCOPY     FISTULA SUPERFICIALIZATION Left 05/30/2020   Procedure: LEFT BRACHIOCEPHALIC Transposition of FISTULA .;  Surgeon: Waynetta Sandy, MD;  Location: [facility];  Service: Vascular;  Laterality: Left;   IR PERC TUN PERIT CATH WO PORT S&I /IMAG  11/14/2020   IR US GUIDE VASC ACCESS RIGHT  11/14/2020   LAPAROSCOPIC NEPHRECTOMY Left 11/10/2020   Procedure: LEFT LAPAROSCOPIC RADICAL NEPHRECTOMY;  Surgeon: Ardis Hughs, MD;  [facility];  Service: Urology;  Laterality: Left;   MULTIPLE TOOTH EXTRACTIONS     ROBOTIC ASSITED PARTIAL NEPHRECTOMY Right 12/05/2017   Procedure: XI ROBOTIC ASSITED LAPAROSCOPIC RIGHT PARTIAL NEPHRECTOMY;  Surgeon: Ardis Hughs, MD;  [facility];  Service: Urology;  Laterality: Right;   VARICOSE VEIN SURGERY Bilateral    legs   Patient Active Problem List   Diagnosis Date Noted   Hypotension 08/16/2021   Syncope 08/15/2021   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[place]) 11/14/2020   ESRD (end stage renal disease) ([place]) 11/13/2020   Dyslipidemia 11/13/2020   Left renal mass 11/10/2020   Polycystic kidney disease 04/06/2018   Renal insufficiency 04/06/2018   Renal mass 12/05/2017   CKD (chronic kidney disease) stage 3, GFR 30-59 ml/min (HCC) 08/11/2017   Acquired complex renal cyst 08/11/2017   Essential hypertension 08/11/2017   Seizure disorder ([place]) 08/11/2017   History of nontraumatic rupture of cerebral aneurysm 08/11/2017   Osteomyelitis ([place]) 08/08/2017    PCP: Robert Bellow, PA-C   REFERRING PROVIDER: Vanetta Mulders, MD   REFERRING DIAG: M25.511 (ICD-10-CM) - Acute pain of right shoulder   THERAPY DIAG:  Right shoulder pain/ Right Shoulder stiffness.      ONSET DATE: 10 /26   SUBJECTIVE:  SUBJECTIVE STATEMENT:   Pt is 15 weeks and 6 days s/p R proximal humerus fx.  Pt states his shoulder is doing very well.  Pt reports none to very little pain.  Pt denies any adverse effects after prior Rx.   Pt states he is making good improvement.  Pt reports improved shoulder mobility.  He has been riding his bike some.  Pt reports improved reaching overhead.  He is able to reach into an overhead cabinet to grab a cup or plate.  Pt reports improved lifting.  Pt is able to dress, bathe, and shave without difficulty.  Pt states he is able to perform  ADLs/self care activities without difficulty.  Pt states he hasn't been performing his HEP as much.  Pt states he has difficulty getting off the floor.                                                                 PERTINENT HISTORY: Seizures, brain aneurysm; knee arthritis, CKD ( dialysis 3 days a week.    PAIN:  Are you having pain? Yes NPRS scale: 3-4/10 Pain location: anterior and superior Right shoulder  Pain orientation: Right  PAIN TYPE: aching Pain description: intermittent  Aggravating factors: use of the shoulder  Relieving factors: not using the shoulder    PRECAUTIONS: Fall and Other: Dialysis   history of seizures    WEIGHT BEARING RESTRICTIONS No   FALLS:  Has patient fallen in last 6 months? Yes Number of falls: 1 fell after becoming syncopal after dialysis       PLOF: Independent   PATIENT GOALS    To be able to use his right arm    OBJECTIVE:     X rays in Epic indicated:   Similar-appearing trace callus formation surrounding a comminuted right humeral head/neck fracture.     TODAY'S TREATMENT:  Therapeutic Exercise:   -Reviewed response to prior Rx, HEP compliance, pain level, and current function.     -Assessed shoulder ROM: Shoulder flexion AROM:  117 deg in standing Shoulder AAROM with wall walks:  151 deg  -Pt received R shoulder PROM in flex, scaption, abduction, ER, and IR in supine per pt tol and tissue tolerance.    -Pt performed:             -supine rhythmic stabs at 90 deg 3x30 sec -serratus punch 2x10 reps  -supine shoulder ABC x 1 rep  -standing wall walks in flexion  x 10 reps             -seated pulleys in flexion and scaption x 20-30 reps each -side lying ER AROM 2x10  -Supine shoulder flexion AROM approx 12 reps -Jobe's flexion 2 x 10 reps -Scap retraction: 2x10 reps red  -shoulder extension 2x10 red   -Reviewed HEP and updated HEP with supine flexion AROM, standing wall walks AAROM flexion, and shoulder ABC.  Gave pt a HEP  handout and was educated in correct form and appropriate frequency.  Instructed pt to not perform into a painful range.           PATIENT EDUCATION: Education details:  Exercise form and POC.  Reviewed HEP and updated HEP.  Gave pt a HEP handout and was educated in correct form and appropriate  frequency.  Instructed pt to not perform into a painful range.  Encouraged pt to be compliant with HEP. Person educated: Patient Education method: Explanation, Demonstration, Tactile cues, Verbal cues Education comprehension: verbalized understanding, returned demonstration, verbal cues required, tactile cues required, and needs further education  HEP Supine Shoulder External Rotation in 45 Degrees Abduction AAROM with Dowel - 2 x daily - 7 x weekly - 3 sets - 10 reps Supine Shoulder Press AAROM in Abduction with Dowel - 2 x daily - 7 x weekly - 3 sets - 10 reps Seated Shoulder Flexion Towel Slide at Table Top - 2 x daily - 7 x weekly - 3 sets - 10 reps Circular Shoulder Pendulum with Table Support - 2 x daily - 7 x weekly - 3 sets - 10 reps Horizontal Shoulder Pendulum with Table Support - 2 x daily - 7 x weekly - 3 sets - 10 reps Scapular Retraction with Resistance - 1 x daily - 7 x weekly - 3 sets - 10 reps Shoulder extension with resistance - Neutral - 1 x daily - 7 x weekly - 3 sets - 10 reps Sidelying Shoulder External Rotation - 1 x daily - 7 x weekly - 3 sets - 10 reps Supine Shoulder Alphabet - 1 x daily - 7 x weekly - 1 reps Supine Shoulder Flexion AROM - 2 x daily - 7 x weekly - 1 sets - 10 reps Standing Shoulder Flexion Wall Walk - 2 x daily - 7 x weekly - 1-2 sets - 10 reps        ASSESSMENT:   CLINICAL IMPRESSION: Pt is making good progress with function as evidenced by subjective reports.  He reports he is able to perform self care activities/ADLs without difficulty.  He is also able to reach into his cabinets.  Pt is limited with shoulder flexion AROM in standing though  demonstrated good flexion AAROM in standing with wall walks.  Pt requires cuing and instruction for correct form with exercises including to straighten elbow with certain exercises and also to slow down and control exercises.  He has improved tightness with PROM and improved PROM based upon visual observation.  PT updated HEP and gave pt a handout.  Pt has not been compliant with HEP and PT encouraged pt to be compliant.  Pt responded well to Rx having no c/o's after Rx.  Pt should cont to benefit from cont skilled PT services to address ongoing goals and impairment and to assist with returning to PLOF.      Objective impairments include decreased activity tolerance, decreased endurance, decreased ROM, decreased strength, impaired flexibility, impaired UE functional use, and pain. These impairments are limiting patient from community activity, meal prep, laundry, and yard work. 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 Patient will benefit from skilled PT to address above impairments and improve overall function.   REHAB POTENTIAL: Good   CLINICAL DECISION MAKING: Evolving/moderate complexity significant loss of function of the arm    EVALUATION COMPLEXITY: Moderate     GOALS:     SHORT TERM GOALS:   STG Name Target Date Goal status  1 Patient will increase passive right shoulder flexion and ER by 20 degrees Baseline:  10/10/2021 Improved to 53 degrees met 1/4  2 Patient will increase active shoulder flexion to 40 degrees  Baseline:  10/10/2021 Improved to 90 degrees  Achieved  1/4  3 Patient will be independent with basic AAROM and PROM program  Baseline: 10/10/2021 Independent; patient reduced to 1x a week to work on [employer] 1/4  LONG TERM GOALS:    LTG Name Target Date Goal status  1 Patient will increase active right shoulder flexion to 90 degrees without pain in order to  perform ADL's  Baseline: 10/31/2021 PARTIALLY MET  2 Patient will use his right arm to feed himself  Baseline: 10/31/2021 GOAL MET  3 Patient will reach behind his head without significant pain in order to perform ADL's  Baseline:  12/27/2021 INITIAL  4 Pt will be able to perform his ADLs and IADLs without significant pain or limitation. 12/27/2021 UPDATED GOAL   PLAN:    PLANNED INTERVENTIONS: Therapeutic exercises, Therapeutic activity, Neuro Muscular re-education, Balance training, Gait training, Patient/Family education, Joint mobilization, Aquatic Therapy, Dry Needling, Cryotherapy, Moist heat, Taping, Ionotophoresis 65m/ml Dexamethasone, and Manual therapy   PLAN FOR NEXT SESSION: Continue with ther ex and ROM per healing constraints.  Progress HEP as able.       RSelinda MichaelsIII PT, DPT 12/04/21 2:12 PM

## 2021-12-06 ENCOUNTER — Encounter (HOSPITAL_BASED_OUTPATIENT_CLINIC_OR_DEPARTMENT_OTHER): Payer: Medicare HMO | Admitting: Physical Therapy

## 2021-12-13 ENCOUNTER — Ambulatory Visit (HOSPITAL_BASED_OUTPATIENT_CLINIC_OR_DEPARTMENT_OTHER): Payer: Medicare HMO | Admitting: Orthopaedic Surgery

## 2021-12-20 ENCOUNTER — Other Ambulatory Visit (HOSPITAL_BASED_OUTPATIENT_CLINIC_OR_DEPARTMENT_OTHER): Payer: Self-pay | Admitting: Orthopaedic Surgery

## 2021-12-20 ENCOUNTER — Ambulatory Visit (HOSPITAL_BASED_OUTPATIENT_CLINIC_OR_DEPARTMENT_OTHER): Payer: Medicare HMO | Admitting: Orthopaedic Surgery

## 2021-12-20 ENCOUNTER — Encounter (HOSPITAL_BASED_OUTPATIENT_CLINIC_OR_DEPARTMENT_OTHER): Payer: Self-pay

## 2021-12-20 DIAGNOSIS — S42294D Other nondisplaced fracture of upper end of right humerus, subsequent encounter for fracture with routine healing: Secondary | ICD-10-CM

## 2022-01-01 IMAGING — CT CT SHOULDER*R* W/O CM
3 of 4 series · 15 of 33 positions shown, 19 images · non-contrast
Comparison: Radiograph dated 08/15/2021.

CLINICAL DATA: Concern for humeral fracture.

EXAM:
CT OF THE UPPER RIGHT EXTREMITY WITHOUT CONTRAST
TECHNIQUE: Multidetector CT imaging of the upper right extremity was performed
according to the standard protocol.

[Series 6: soft tissue · axial · 0.44mm/px · z∈[+820,+974]mm · 9 of 93 slices shown, 12 images]
[im 8/93  soft-tissue]
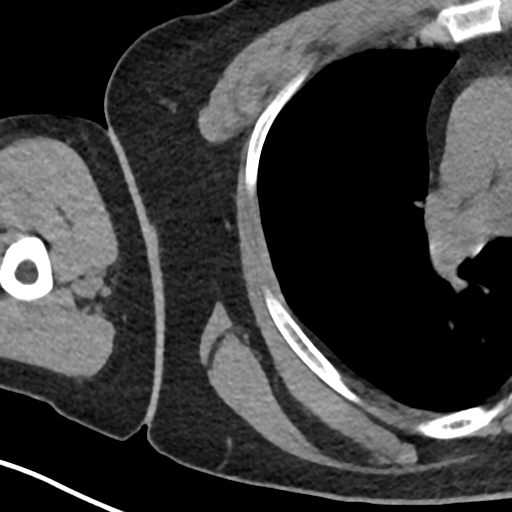
[im 8/93  bone]
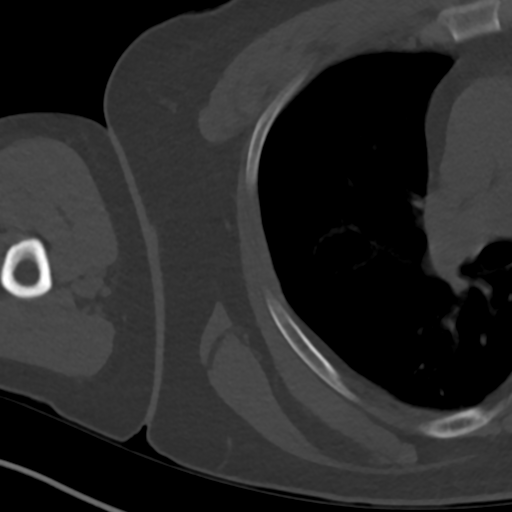
[im 22/93  bone]
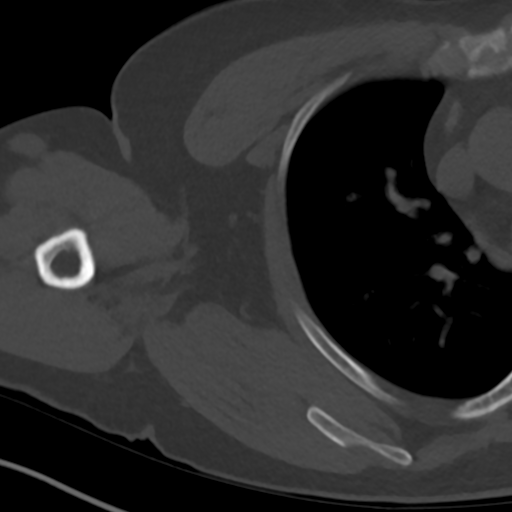
[im 29/93  bone]
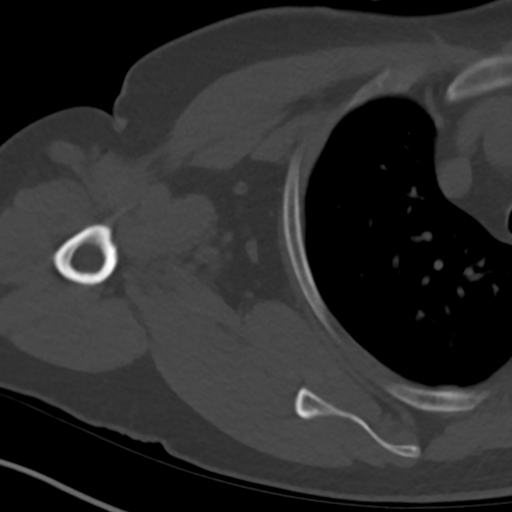
[im 36/93  bone]
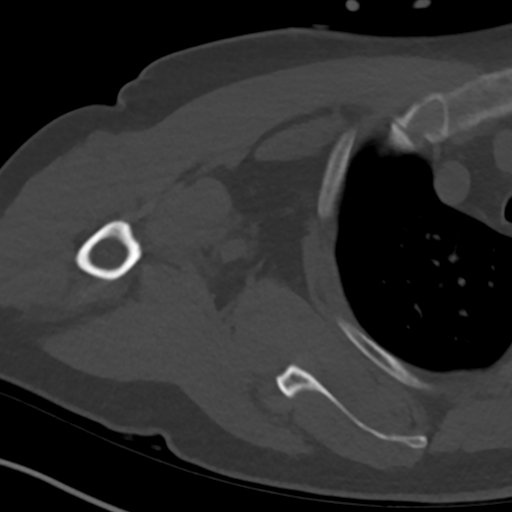
[im 50/93  soft-tissue]
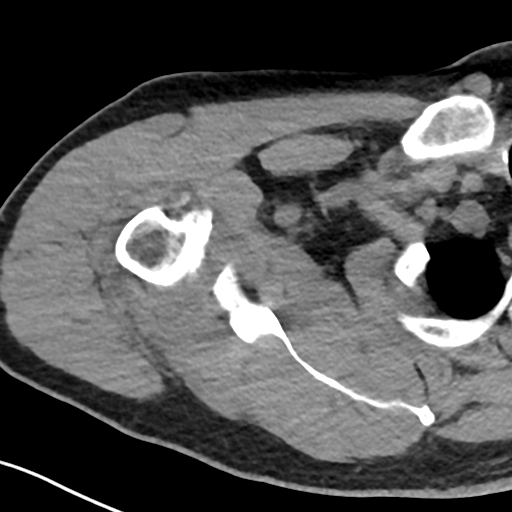
[im 50/93  bone]
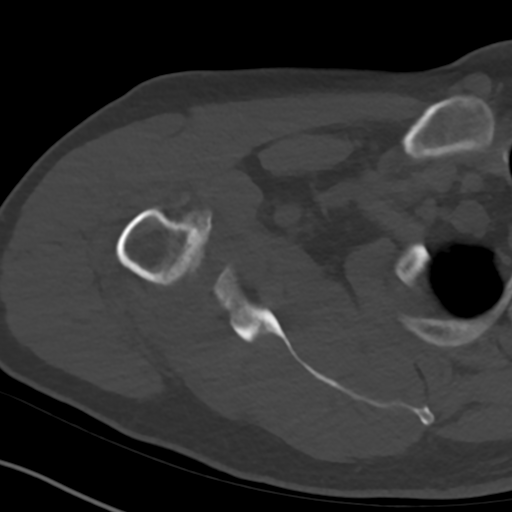
[im 57/93  bone]
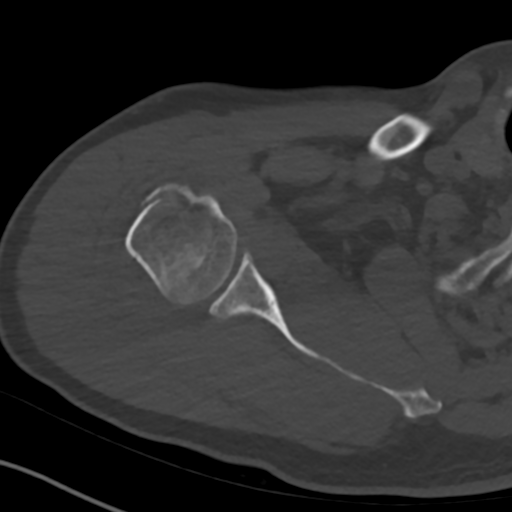
[im 64/93  bone]
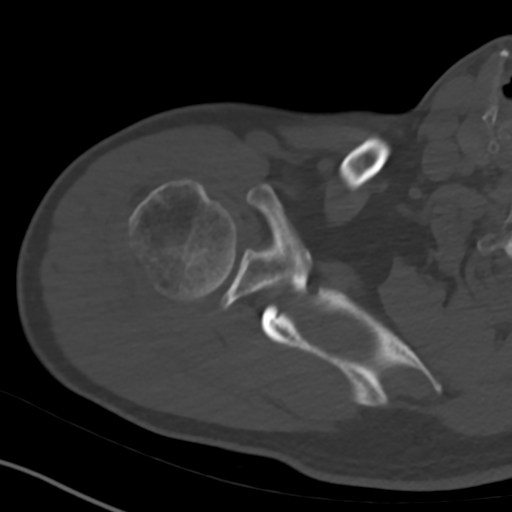
[im 78/93  bone]
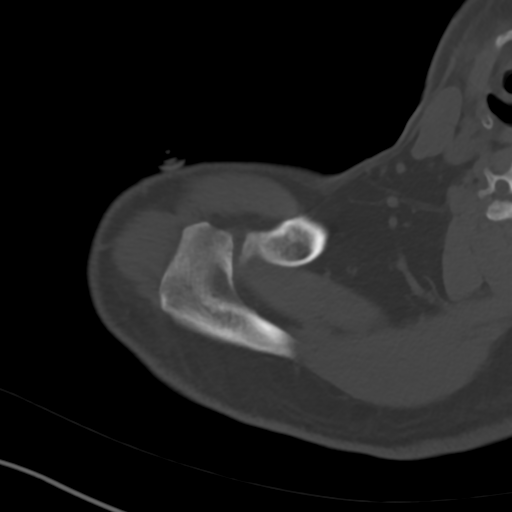
[im 85/93  soft-tissue]
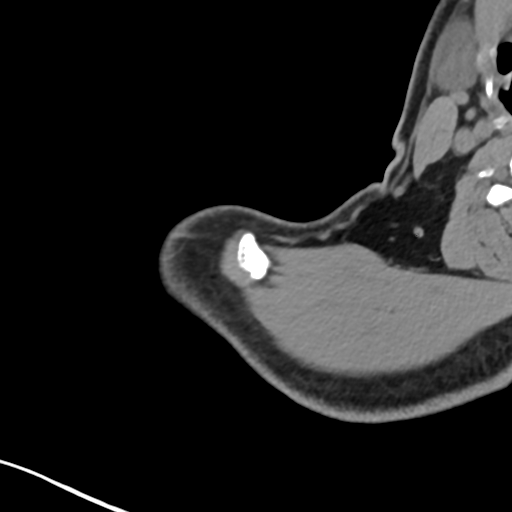
[im 85/93  bone]
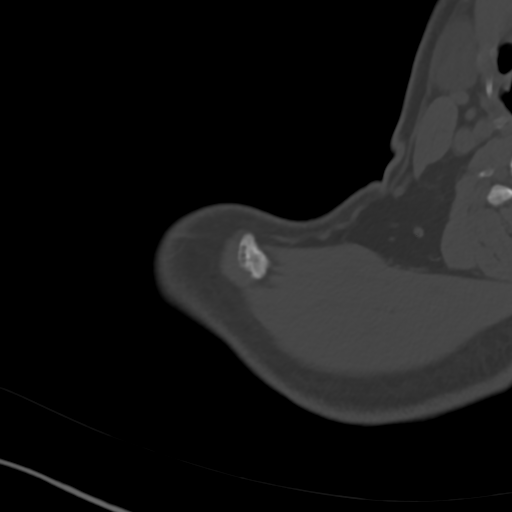

[Series 8: sag bone · sagittal · 0.35mm/px · 5 of 130 slices shown, 6 images]
[im 44/130  bone]
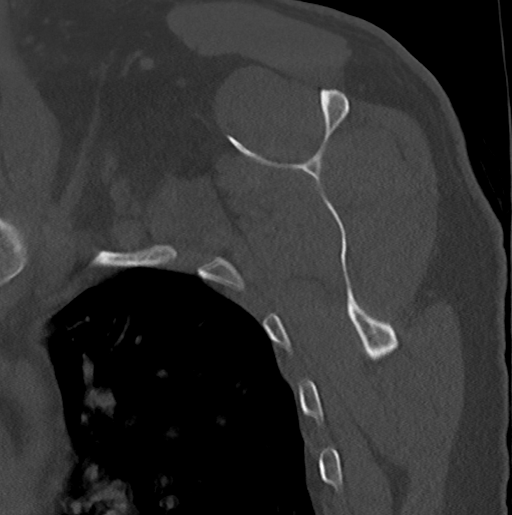
[im 54/130  bone]
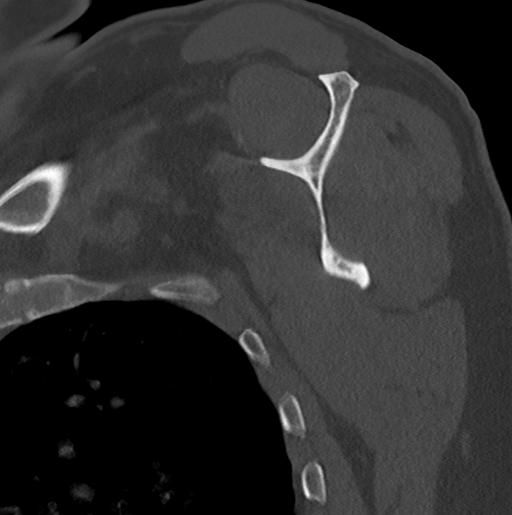
[im 65/130  soft-tissue]
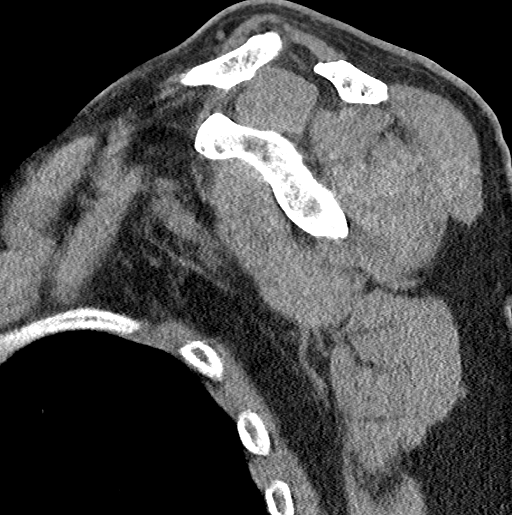
[im 65/130  bone]
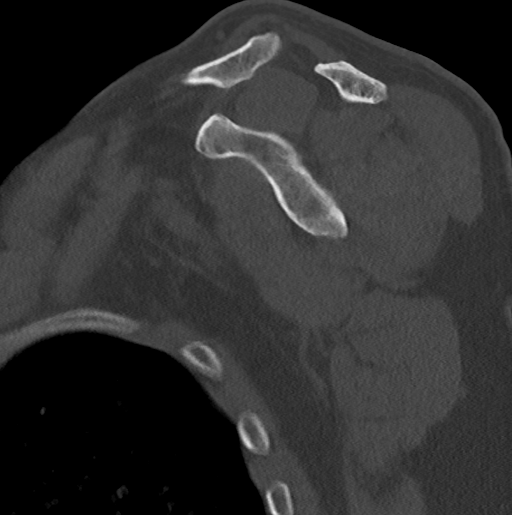
[im 76/130  bone]
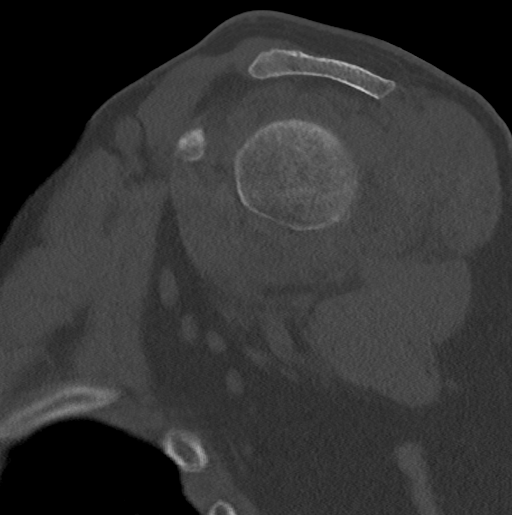
[im 87/130  bone]
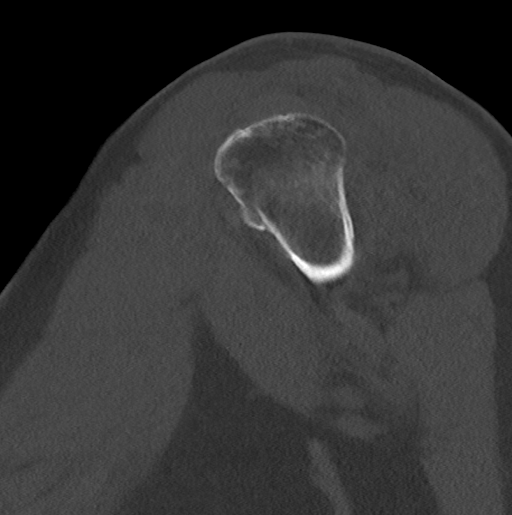

[Series 9: sag soft · coronal · 0.35mm/px · 1 of 105 slices shown]
[im 53/105  bone]
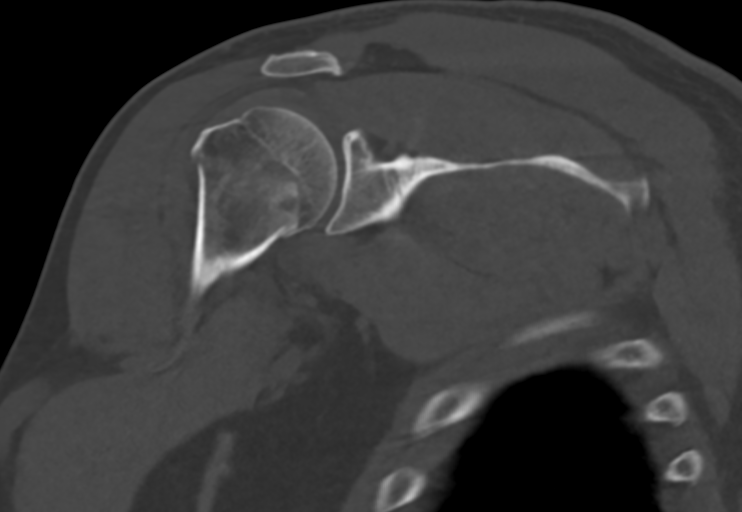

[15 of 33 positions shown; findings below may reference images not displayed]

FINDINGS: Bones/Joint/Cartilage

There is a nondisplaced fracture of the humeral neck. No
dislocation. There is mild joint effusion and edema. No fluid
collection.

Ligaments

Suboptimally assessed by CT.

Muscles and Tendons

No intramuscular hematoma or fluid collection.

Soft tissues

No fluid collection.
IMPRESSION: Nondisplaced fracture of the humeral neck. No dislocation.

## 2022-01-28 IMAGING — DX DG SHOULDER 2+V*R*
3 series · 3 of 3 positions shown · non-contrast
Comparison: 08/15/2021.

CLINICAL DATA: Fracture right humerus

EXAM:
RIGHT SHOULDER - 2+ VIEW

[shoulder grashey]
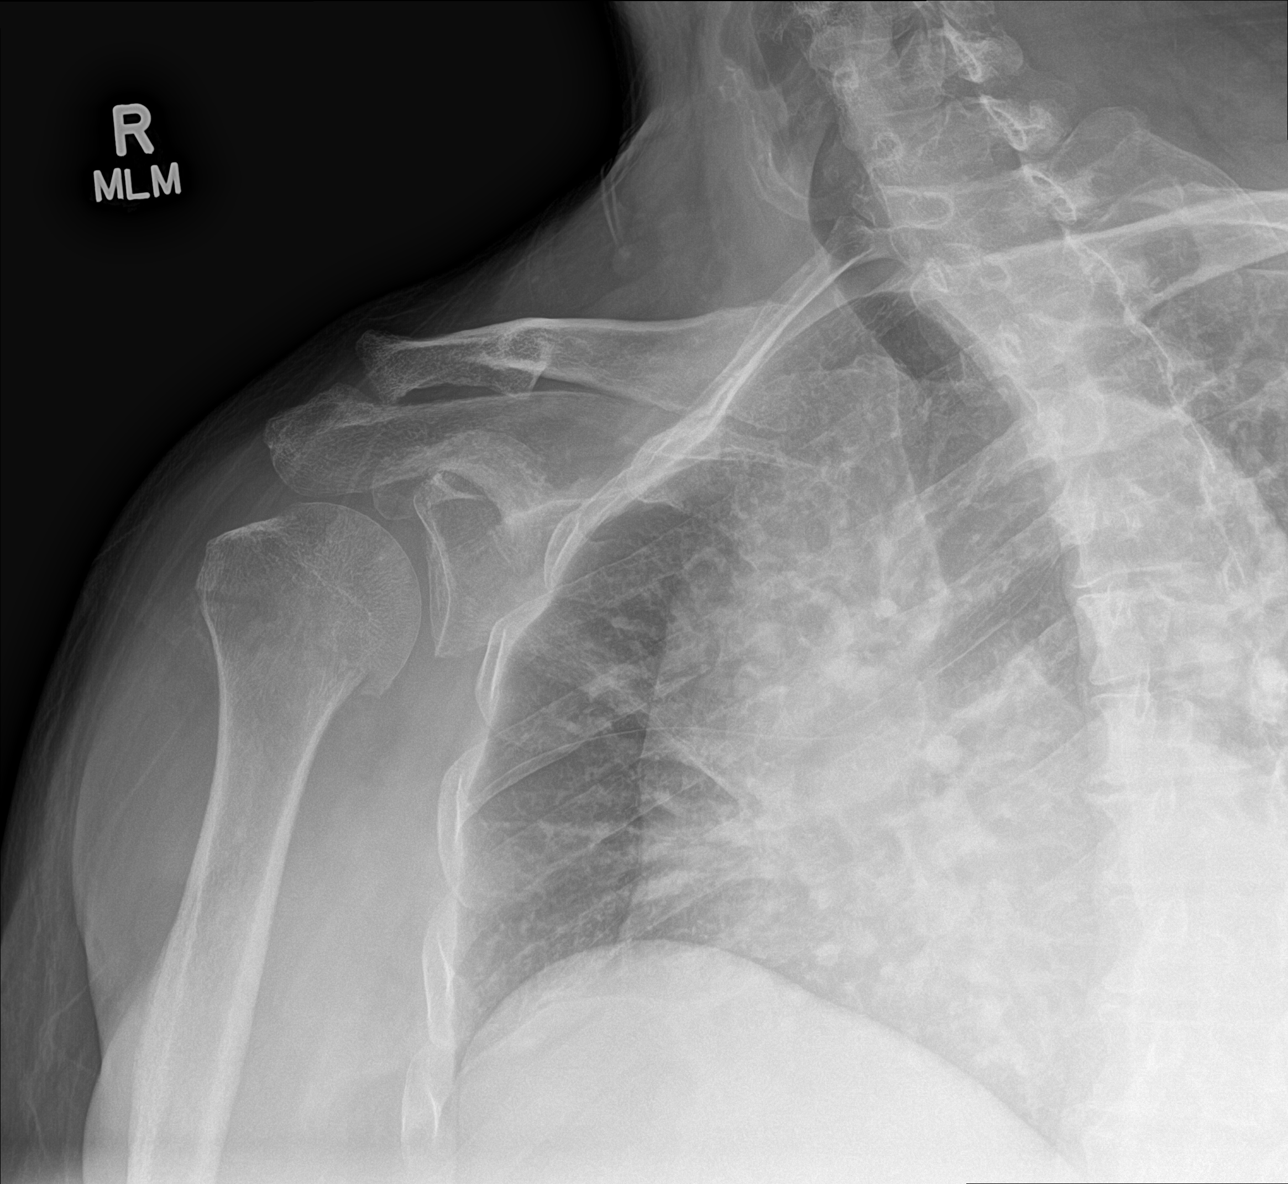

[shoulder y view]
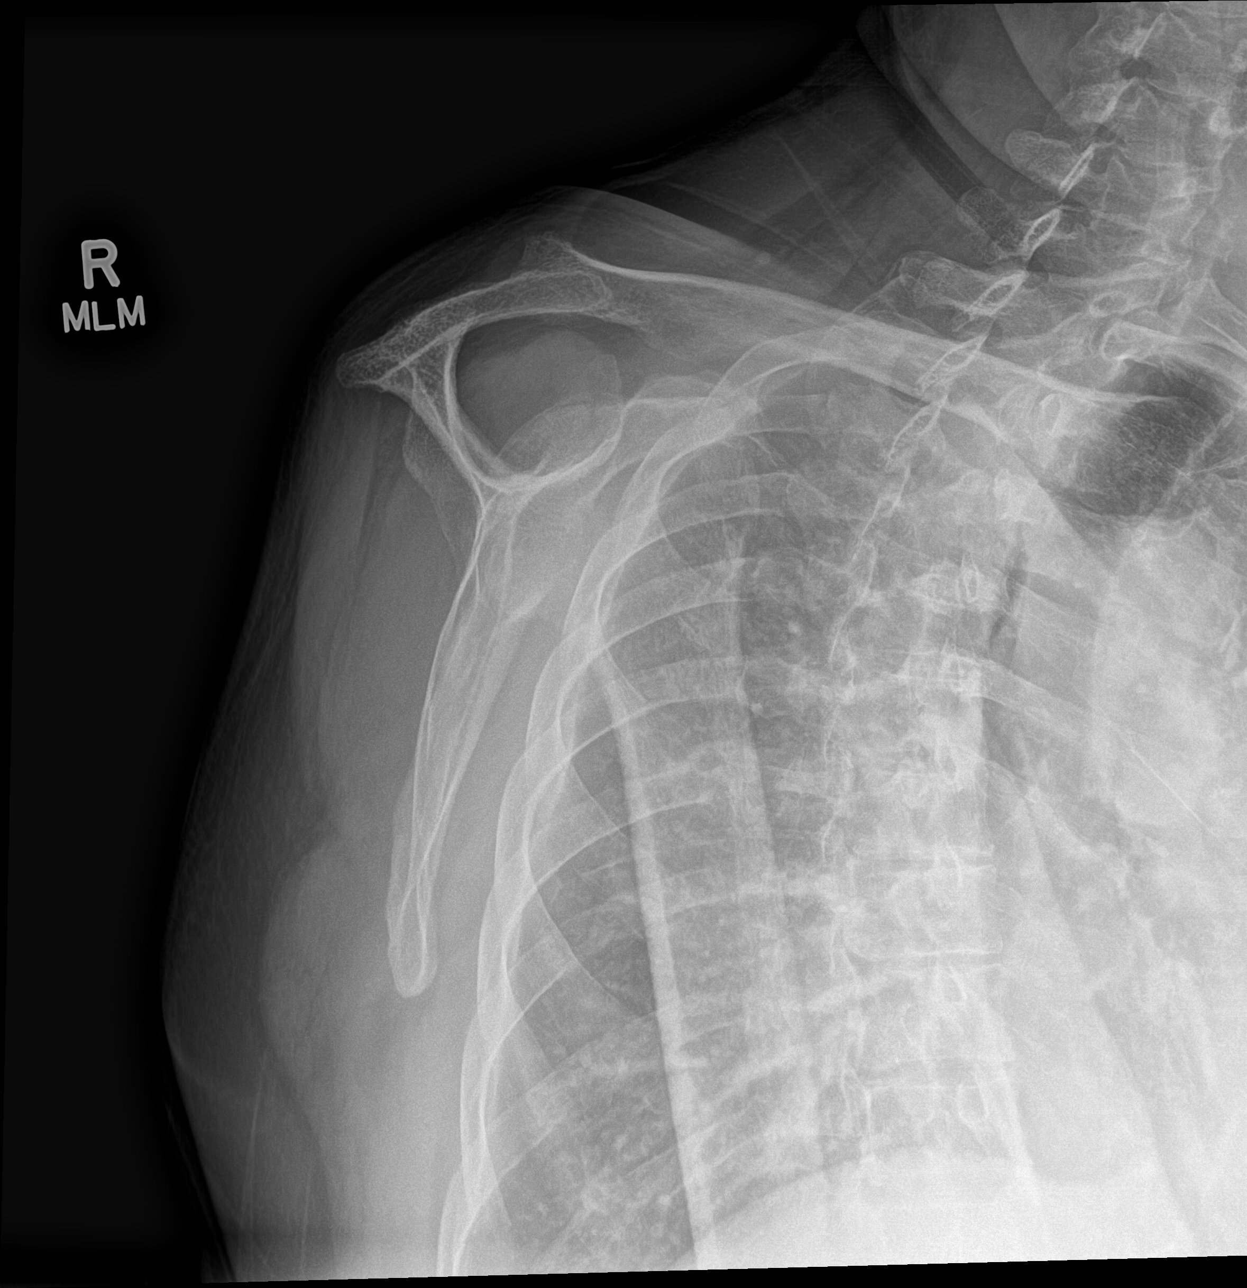

[shoulder axillary]
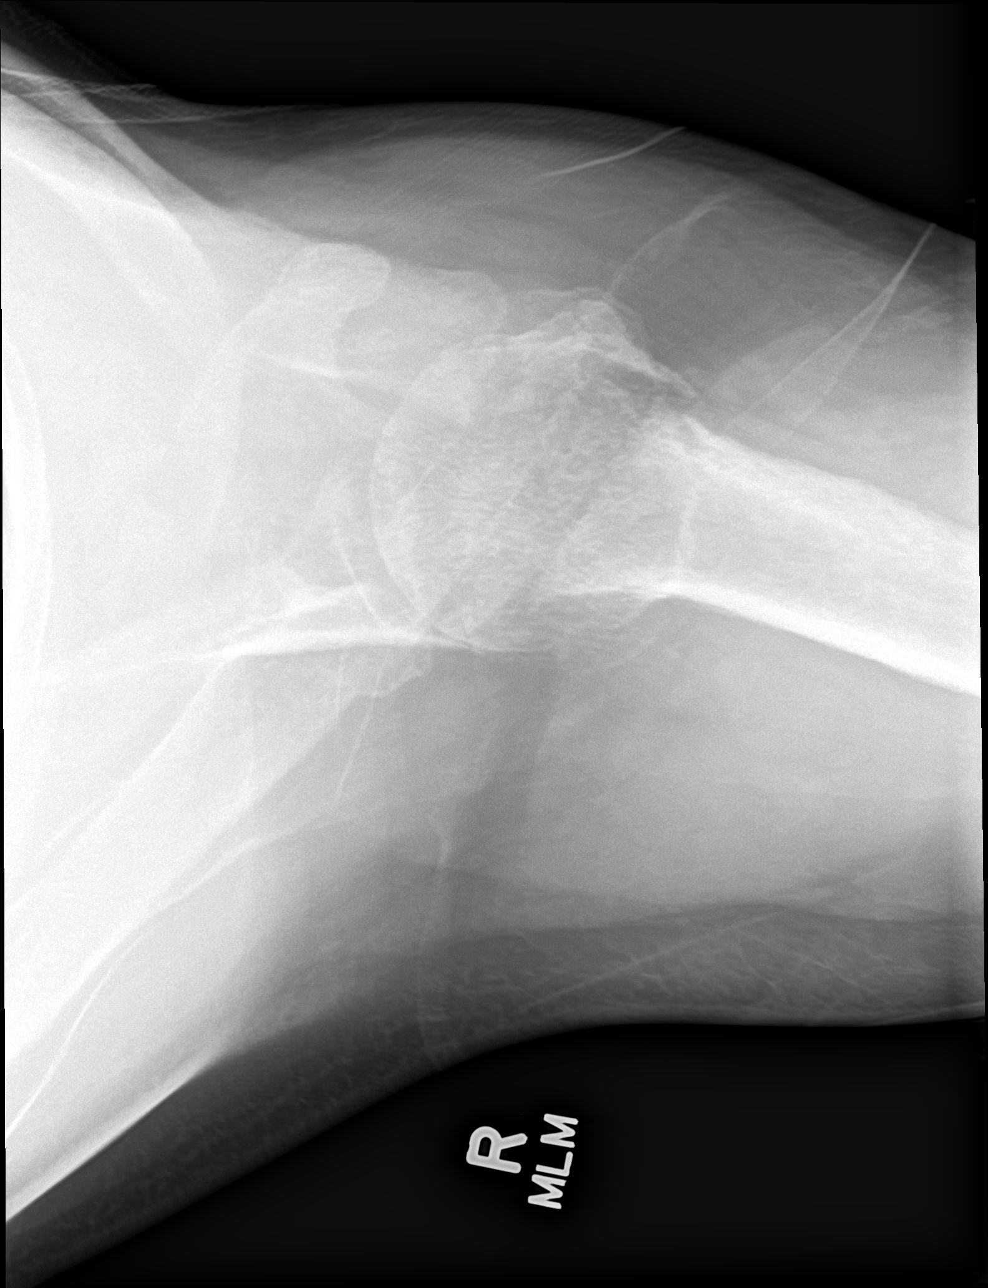

[3 of 3 positions shown; findings below may reference images not displayed]

FINDINGS: There is comminuted fracture in the neck of right humerus. There is
offset in alignment of medial cortex of the neck. Overall, no
significant interval changes are noted in the alignment of fracture
fragments. There is subtle callus formation adjacent to the fracture
site. There is no dislocation.
IMPRESSION: Healing fracture is seen in the neck of right humerus. There is no
significant change in alignment of fracture fragments.

## 2024-10-20 ENCOUNTER — Emergency Department (HOSPITAL_COMMUNITY)
Admission: EM | Admit: 2024-10-20 | Discharge: 2024-10-20 | Disposition: A | Discharge status: Home / Self Care | Attending: Emergency Medicine | Admitting: Emergency Medicine

## 2024-10-20 ENCOUNTER — Other Ambulatory Visit: Payer: Self-pay

## 2024-10-20 ENCOUNTER — Emergency Department (HOSPITAL_COMMUNITY)

## 2024-10-20 DIAGNOSIS — I10 Essential (primary) hypertension: Secondary | ICD-10-CM | POA: Diagnosis not present

## 2024-10-20 DIAGNOSIS — M25562 Pain in left knee: Secondary | ICD-10-CM | POA: Insufficient documentation

## 2024-10-20 DIAGNOSIS — Z940 Kidney transplant status: Secondary | ICD-10-CM | POA: Insufficient documentation

## 2024-10-20 DIAGNOSIS — M25462 Effusion, left knee: Secondary | ICD-10-CM | POA: Diagnosis not present

## 2024-10-20 DIAGNOSIS — S8992XA Unspecified injury of left lower leg, initial encounter: Secondary | ICD-10-CM
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
## 2024-10-20 NOTE — Discharge Instructions (Addendum)
 It was a pleasure meeting with you today.  As we discussed there were no fractures or dislocation seen on imaging of your left knee today.  There was soft tissue edema and trace joint effusion seen.  Recommend RICE or rest ice compression and elevation.  Follow-up with outpatient orthopedics in 1 week as needed for ongoing management.  Return for further evaluation if pain worsens or any new or concerning symptoms develop.
